# Patient Record
Sex: Female | Born: 1945 | Race: White | Hispanic: No | Smoking: Never smoker
Health system: Southern US, Community
[De-identification: ages and names within clinical notes are randomized; demographics above are authoritative.]

## PROBLEM LIST (undated history)

## (undated) DIAGNOSIS — Z85828 Personal history of other malignant neoplasm of skin: Secondary | ICD-10-CM

## (undated) DIAGNOSIS — I341 Nonrheumatic mitral (valve) prolapse: Secondary | ICD-10-CM

## (undated) DIAGNOSIS — D126 Benign neoplasm of colon, unspecified: Secondary | ICD-10-CM

## (undated) DIAGNOSIS — M109 Gout, unspecified: Secondary | ICD-10-CM

## (undated) DIAGNOSIS — E785 Hyperlipidemia, unspecified: Secondary | ICD-10-CM

## (undated) DIAGNOSIS — I10 Essential (primary) hypertension: Secondary | ICD-10-CM

## (undated) DIAGNOSIS — R7309 Other abnormal glucose: Secondary | ICD-10-CM

## (undated) HISTORY — DX: Personal history of other malignant neoplasm of skin: Z85.828

## (undated) HISTORY — DX: Gout, unspecified: M10.9

## (undated) HISTORY — DX: Nonrheumatic mitral (valve) prolapse: I34.1

## (undated) HISTORY — DX: Hyperlipidemia, unspecified: E78.5

## (undated) HISTORY — DX: Benign neoplasm of colon, unspecified: D12.6

## (undated) HISTORY — DX: Essential (primary) hypertension: I10

## (undated) HISTORY — DX: Other abnormal glucose: R73.09

---

## 1993-08-26 HISTORY — PX: CARDIAC CATHETERIZATION: SHX172

## 2003-07-01 ENCOUNTER — Encounter: Payer: Self-pay | Admitting: Family Medicine

## 2003-07-01 HISTORY — PX: COLONOSCOPY: SHX174

## 2006-06-24 LAB — CONVERTED CEMR LAB: Pap Smear: NORMAL

## 2008-03-10 ENCOUNTER — Encounter: Payer: Self-pay | Admitting: Family Medicine

## 2008-05-31 ENCOUNTER — Encounter: Payer: Self-pay | Admitting: Family Medicine

## 2008-06-26 HISTORY — PX: MOHS SURGERY: SUR867

## 2009-07-27 ENCOUNTER — Ambulatory Visit: Payer: Self-pay | Admitting: Family Medicine

## 2009-07-27 DIAGNOSIS — I10 Essential (primary) hypertension: Secondary | ICD-10-CM | POA: Insufficient documentation

## 2009-07-27 DIAGNOSIS — E785 Hyperlipidemia, unspecified: Secondary | ICD-10-CM | POA: Insufficient documentation

## 2009-07-27 DIAGNOSIS — Z85828 Personal history of other malignant neoplasm of skin: Secondary | ICD-10-CM | POA: Insufficient documentation

## 2009-08-31 ENCOUNTER — Encounter: Payer: Self-pay | Admitting: Family Medicine

## 2009-09-11 ENCOUNTER — Encounter: Admission: RE | Admit: 2009-09-11 | Discharge: 2009-09-11 | Payer: Self-pay | Admitting: Family Medicine

## 2009-09-14 ENCOUNTER — Encounter (INDEPENDENT_AMBULATORY_CARE_PROVIDER_SITE_OTHER): Payer: Self-pay | Admitting: *Deleted

## 2009-09-19 ENCOUNTER — Ambulatory Visit: Payer: Self-pay | Admitting: Family Medicine

## 2009-09-20 LAB — CONVERTED CEMR LAB
AST: 34 units/L (ref 0–37)
Albumin: 4.2 g/dL (ref 3.5–5.2)
Alkaline Phosphatase: 61 units/L (ref 39–117)
Basophils Absolute: 0.1 10*3/uL (ref 0.0–0.1)
CO2: 28 meq/L (ref 19–32)
Calcium: 9.9 mg/dL (ref 8.4–10.5)
Creatinine, Ser: 0.8 mg/dL (ref 0.4–1.2)
Eosinophils Absolute: 0.1 10*3/uL (ref 0.0–0.7)
GFR calc non Af Amer: 76.73 mL/min (ref >60)
HDL: 68.5 mg/dL (ref >39.00)
Hemoglobin: 12.6 g/dL (ref 12.0–15.0)
LDL Cholesterol: 61 mg/dL (ref 0–99)
Lymphocytes Relative: 28.4 % (ref 12.0–46.0)
Lymphs Abs: 2 10*3/uL (ref 0.7–4.0)
MCHC: 32 g/dL (ref 30.0–36.0)
Monocytes Relative: 10.6 % (ref 3.0–12.0)
Neutro Abs: 4.2 10*3/uL (ref 1.4–7.7)
Platelets: 263 10*3/uL (ref 150.0–400.0)
RBC: 4.05 M/uL (ref 3.87–5.11)
RDW: 12.8 % (ref 11.5–14.6)
Sodium: 138 meq/L (ref 135–145)
TSH: 2.9 microintl units/mL (ref 0.35–5.50)
Total Bilirubin: 0.8 mg/dL (ref 0.3–1.2)
Total CHOL/HDL Ratio: 2
Triglycerides: 151 mg/dL — ABNORMAL HIGH (ref 0.0–149.0)
VLDL: 30.2 mg/dL (ref 0.0–40.0)

## 2009-09-25 ENCOUNTER — Other Ambulatory Visit: Admission: RE | Admit: 2009-09-25 | Discharge: 2009-09-25 | Payer: Self-pay | Admitting: Family Medicine

## 2009-09-25 ENCOUNTER — Ambulatory Visit: Payer: Self-pay | Admitting: Family Medicine

## 2009-10-05 ENCOUNTER — Encounter (INDEPENDENT_AMBULATORY_CARE_PROVIDER_SITE_OTHER): Payer: Self-pay | Admitting: *Deleted

## 2009-10-05 LAB — CONVERTED CEMR LAB: Pap Smear: NEGATIVE

## 2010-03-05 ENCOUNTER — Telehealth: Payer: Self-pay | Admitting: Family Medicine

## 2010-03-05 DIAGNOSIS — D126 Benign neoplasm of colon, unspecified: Secondary | ICD-10-CM | POA: Insufficient documentation

## 2010-03-23 ENCOUNTER — Encounter (INDEPENDENT_AMBULATORY_CARE_PROVIDER_SITE_OTHER): Payer: Self-pay

## 2010-03-27 ENCOUNTER — Ambulatory Visit: Payer: Self-pay | Admitting: Internal Medicine

## 2010-04-10 ENCOUNTER — Ambulatory Visit: Payer: Self-pay | Admitting: Internal Medicine

## 2010-04-17 ENCOUNTER — Encounter: Payer: Self-pay | Admitting: Family Medicine

## 2010-04-17 ENCOUNTER — Telehealth: Payer: Self-pay | Admitting: Family Medicine

## 2010-04-20 ENCOUNTER — Ambulatory Visit: Payer: Self-pay | Admitting: Family Medicine

## 2010-04-23 LAB — CONVERTED CEMR LAB
Hgb A1c MFr Bld: 6.4 % (ref 4.6–6.5)
Microalb Creat Ratio: 1.6 mg/g (ref 0.0–30.0)
Microalb, Ur: 0.5 mg/dL (ref 0.0–1.9)

## 2010-05-29 ENCOUNTER — Encounter (INDEPENDENT_AMBULATORY_CARE_PROVIDER_SITE_OTHER): Payer: Self-pay | Admitting: *Deleted

## 2010-09-24 ENCOUNTER — Encounter
Admission: RE | Admit: 2010-09-24 | Discharge: 2010-09-24 | Payer: Self-pay | Source: Home / Self Care | Attending: Family Medicine | Admitting: Family Medicine

## 2010-09-25 ENCOUNTER — Encounter (INDEPENDENT_AMBULATORY_CARE_PROVIDER_SITE_OTHER): Payer: Self-pay | Admitting: *Deleted

## 2010-09-25 NOTE — Letter (Addendum)
Summary: Glucose Log from Patient  Glucose Log from Patient   Imported By: Lanelle Bal 04/24/2010 11:56:21  _____________________________________________________________________  External Attachment:    Type:   Image     Comment:   External Document

## 2010-09-25 NOTE — Assessment & Plan Note (Addendum)
Summary: CPX  CYD   Vital Signs:  Patient profile:   64 year old female Height:      60 inches Weight:      127 pounds BMI:     24.89 Temp:     98.7 degrees F oral Pulse rate:   64 / minute Pulse rhythm:   regular BP sitting:   135 / 70  (left arm) Cuff size:   regular  Vitals Entered By: Lowella Petties CMA (September 25, 2009 2:03 PM)  Serial Vital Signs/Assessments:  Time      Position  BP       Pulse  Resp  Temp     By                     135/70                         Judith Part MD  CC: 30 minute check up   History of Present Illness: here for health mt exam  has been feeling well overall -- nothing new   wt is stable with bmi of 24  HTN -- bp high today 150/80 is not usually that high -- is usually 130s / 70s -- perhaps some white coat component   AIC 6.3- hyperglycemia  this is the highest it has been  is not really exercising  does not eat sweets at all / only diet drinks and no juice  needs to cut down more on her starch -- potato/ pasta/ cracker  went to DM educator in the past -- knows how to monitor it  her fasting glucose used to be higher - it was 99   opthy- [facility]/ no DM retinop/ borderline glaucoma   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 some concern about that  she herself is not worried about it -- it has not changed at all    pap 10/07 wants to go ahead with that   mam 1/11 self exam -- no lumps or changes Td 06  flu shot got that   shingles status- has never had it  is not interested in the vaccine  lipids very well controlled with statin Last Lipid ProfileCholesterol: 160 (09/19/2009 9:48:02 AM)HDL:  68.50 (09/19/2009 9:48:02 AM)LDL:  61 (09/19/2009 9:48:02 AM)Triglycerides:  Last Liver profileSGOT:  34 (09/19/2009 9:48:02 AM)SPGT:  27 (09/19/2009 9:48:02 AM)T. Bili:  0.8 (09/19/2009 9:48:02 AM)Alk  Phos:  61 (09/19/2009 9:48:02 AM)   OP screen --had borderline osteopenia / normal  ca and D-- is not on calcium (gives her stomach upset )      Allergies: 1)  ! Ampicillin 2)  ! Keflex 3)  ! * Calcium  Past History:  Past Medical History: Last updated: 07/27/2009 basal skin ca HTN hyperlipidemia  colon polyps  hyperglycemia  MVP - very mild   derm Dr Cherly Hensen - in [place] [facility] Dr Everlene Farrier  Past Surgical History: Last updated: 07/27/2009 mohs surgery for basal cell lesion 11/09 cardiac cath 1995  was normal (after an abn stress test)  colonoscopy 07/01/2003- polyps   Family History: Last updated: 07/27/2009 mother is 65 and healthy , macular degeneration  father died at 37 (sudden cardiac death ? 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 Eyes:  Denies blurring and eye pain. CV:  Denies chest pain or discomfort and shortness of breath with exertion. Resp:  Denies cough and wheezing. GI:  Denies abdominal pain, bloody stools, change in bowel habits, indigestion, and nausea. GU:  Denies abnormal vaginal bleeding, discharge, dysuria, and urinary frequency. MS:  Denies joint pain and cramps. Derm:  Denies itching, lesion(s), and rash. Neuro:  Denies numbness and tingling. Psych:  mood is ok. Endo:  Denies excessive thirst and excessive urination. Heme:  Denies abnormal bruising and bleeding.  Physical Exam  General:  Well-developed,well-nourished,in no acute distress; alert,appropriate and cooperative throughout examination Head:  normocephalic, atraumatic, and no abnormalities observed.   Eyes:  vision grossly intact, pupils equal, pupils round, and pupils reactive to light.  no conjunctival pallor, injection or icterus    Ears:  R ear normal and L ear normal.   Nose:  no nasal discharge.   Mouth:  pharynx pink and moist.   Neck:  supple with full rom and no masses or thyromegally, no JVD or carotid bruit  Chest Wall:  No deformities, masses, or tenderness noted. Breasts:  No mass, nodules, thickening, tenderness, bulging, retraction, inflamation, nipple discharge or skin changes noted.   Lungs:  Normal respiratory effort, chest expands symmetrically. Lungs are clear to auscultation, no crackles or wheezes. Heart:  Normal rate and regular rhythm. S1 and S2 normal without gallop, murmur, click, rub or other extra sounds. (no M heard) Abdomen:  Bowel sounds positive,abdomen soft and non-tender without masses, organomegaly or hernias noted. Genitalia:  Normal introitus for age, no external lesions, no vaginal discharge, mucosa pink and moist, no vaginal or cervical lesions, no vaginal atrophy, no friaility or hemorrhage, normal uterus size and position, no adnexal masses or tenderness Msk:  No deformity or scoliosis noted of thoracic or lumbar spine.  no acute joint changes  Pulses:  R and L carotid,radial,femoral,dorsalis pedis and posterior tibial pulses are full and equal bilaterally Extremities:  No clubbing, cyanosis, edema, or deformity noted with normal full range of motion of all joints.   Neurologic:  sensation intact to light touch, gait normal, and DTRs symmetrical and normal.   Skin:  scar in mid chest - healing  no rash Cervical Nodes:  No lymphadenopathy noted Axillary Nodes:  No palpable lymphadenopathy Inguinal Nodes:  No significant adenopathy Psych:  normal affect, talkative and pleasant   Diabetes Management Exam:    Eye Exam:       Eye Exam done elsewhere          Date: 09/23/2009          Results: normal          Done by: [facility]   Impression & Recommendations:  Problem # 1:  HEALTH MAINTENANCE EXAM (ICD-V70.0) Assessment Comment Only reviewed health habits including  diet, exercise and skin cancer prevention reviewed health maintenance list and family history rev labs today  adv to start regular exercise   Problem # 2:  DIABETES MELLITUS, 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 Take one by mouth daily  Problem # 3:  HYPERLIPIDEMIA (ICD-272.4) Assessment: Unchanged  great control with lipitor and diet  rev low sat fat diet  Her updated medication list for this problem includes:    Lipitor 20 Mg Tabs (Atorvastatin calcium) .Marland Kitchen... Take one by mouth daily  Labs Reviewed: SGOT: 34 (09/19/2009)   SGPT: 27 (09/19/2009)   HDL:68.50 (09/19/2009)  LDL:61 (09/19/2009)  Chol:160 (09/19/2009)  Trig:151.0 (09/19/2009)  Problem # 4:  HYPERTENSION (ICD-401.9) Assessment: Unchanged  bp imp on second check today disc healthy diet (low simple sugar/ choose complex carbs/ low sat fat) diet and exercise in detail  Her updated medication list for this problem includes:    Lisinopril 20 Mg Tabs (Lisinopril) .Marland Kitchen... Take one by mouth daily    Atenolol 25 Mg Tabs (Atenolol) .Marland Kitchen... 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 Take one by mouth daily 2)  Atenolol 25 Mg Tabs (Atenolol) .... Take one by mouth daily 3)  Lipitor 20 Mg Tabs (Atorvastatin calcium) .... Take one by mouth daily  Patient Instructions: 1)  the current recommendation for calcium intake is 1200-1500 mg daily with -1000 IU of vitamin D  2)  please monitor blood sugar two times a day (fasting and then 2 hours after a meal) -- for about a month to get a feel for when your sugar is high  3)  work on cutting portions of carbs and choosing complex one 4)  good book is called 'sugar busters' 5)  It is important that you exercise reguarly at least 20 minutes 5 times a week. If you develop chest pain, have severe difficulty breathing, or feel very tired, stop exercising immediately and seek medical attention.  6)  schedule non fasting labs and then f/u in 6 mo   glucose/ microalb and AIC 250.0  Prescriptions: LIPITOR 20 MG TABS (ATORVASTATIN CALCIUM) take one by mouth daily  #30 x 11   Entered and Authorized by:   Judith Part MD   Signed by:   Judith Part MD on 09/25/2009   Method used:   Print then Give to Patient   RxID:   [id number] ATENOLOL 25 MG TABS (ATENOLOL) take one by mouth daily  #90 x 3   Entered and Authorized by:   Judith Part MD   Signed by:   Judith Part MD on 09/25/2009   Method used:   Print then Give to Patient   RxID:   [id number] LISINOPRIL 20 MG TABS (LISINOPRIL) take one by mouth daily  #90 x 3   Entered and Authorized by:   Judith Part MD   Signed by:   Judith Part MD on 09/25/2009   Method used:  Print then Give to Patient   RxID:   [id number]   Prior Medications (reviewed today): LISINOPRIL 20 MG TABS (LISINOPRIL) take one by mouth daily ATENOLOL 25 MG TABS (ATENOLOL) take one by mouth daily LIPITOR 20 MG TABS (ATORVASTATIN CALCIUM) take one by mouth daily Current Allergies (reviewed today): ! AMPICILLIN ! KEFLEX ! *  CALCIUM     Influenza Immunization History:    Influenza # 1:  Fluvax 3+ (05/26/2009)

## 2010-09-25 NOTE — Letter (Addendum)
Summary: [facility] Instructions  [facility]  [address] [place], [place] [address]   Phone: [phone]  Fax: [phone]       Cindy [name]    Apr 28, 1946    MRN: [id number]       Procedure Day Dorna Bloom:  [name]  04/10/10     Arrival Time: 7:30am     Procedure Time: 8:00am     Location of Procedure:                    [name] (4th Floor)   PREPARATION FOR COLONOSCOPY WITH MIRALAX  Starting 5 days prior to your procedure  THURSDAY 08/11  do not eat nuts, seeds, popcorn, corn, beans, peas,  salads, or any raw vegetables.  Do not take any fiber supplements (e.g. Metamucil, Citrucel, and Benefiber). ____________________________________________________________________________________________________   THE DAY BEFORE YOUR PROCEDURE        DATE: MONDAY  08/15  1   Drink clear liquids the entire day-NO SOLID FOOD  2   Do not drink anything colored red or purple.  Avoid juices with pulp.  No orange juice.  3   Drink at least 64 oz. (8 glasses) of fluid/clear liquids during the day to prevent dehydration and help the prep work efficiently.  CLEAR LIQUIDS INCLUDE: Water Jello Ice Popsicles Tea (sugar ok, no milk/cream) Powdered fruit flavored drinks Coffee (sugar ok, no milk/cream) Gatorade Juice: apple, white grape, white cranberry  Lemonade Clear bullion, consomm, broth Carbonated beverages (any kind) Strained chicken noodle soup Hard Candy  4   Mix the entire bottle of Miralax with 64 oz. of Gatorade/Powerade in the morning and put in the refrigerator to chill.  5   At 3:00 pm take 2 Dulcolax/Bisacodyl tablets.  6   At 4:30 pm take one Reglan/Metoclopramide tablet.  7  Starting at 5:00 pm drink one 8 oz glass of the Miralax mixture every 15-20 minutes until you have finished drinking the entire 64 oz.  You should finish drinking prep around 7:30 or 8:00 pm.  8   If you are nauseated, you may take the 2nd Reglan/Metoclopramide  tablet at 6:30 pm.        9    At 8:00 pm take 2 more DULCOLAX/Bisacodyl tablets.     THE DAY OF YOUR PROCEDURE      DATE:  TUESDAY  08/16  You may drink clear liquids until  6:00am   (2 HOURS BEFORE PROCEDURE).   MEDICATION INSTRUCTIONS  Unless otherwise instructed, you should take regular prescription medications with a small sip of water as early as possible the morning of your procedure.          OTHER INSTRUCTIONS  You will need a responsible adult at least 66 years of age to accompany you and drive you home.   This person must remain in the waiting room during your procedure.  Wear loose fitting clothing that is easily removed.  Leave jewelry and other valuables at home.  However, you may wish to bring a book to read or an iPod/MP3 player to listen to music as you wait for your procedure to start.  Remove all body piercing jewelry and leave at home.  Total time from sign-in until discharge is approximately 2-3 hours.  You should go home directly after your procedure and rest.  You can resume normal activities the day after your procedure.  The day of your procedure you should not:   Drive  Make legal decisions   Operate machinery   Drink alcohol   Return to work  You will receive specific instructions about eating, activities and medications before you leave.   The above instructions have been reviewed and explained to me by   Ulis Rias RN  March 27, 2010 10:08 AM     I fully understand and can verbalize these instructions _____________________________ Date _______

## 2010-09-25 NOTE — Miscellaneous (Addendum)
Summary: flu vaccine at [facility]  Clinical Lists Changes  Observations: Added new observation of FLU VAX: Historical (05/28/2010 8:18)      Immunization History:  Influenza Immunization History:    Influenza:  historical (05/28/2010)  Pt received flu vaccine at [facility] [address], [place].              Lowella Petties CMA  May 29, 2010 8:19 AM

## 2010-09-25 NOTE — Consult Note (Addendum)
Summary: [facility]  [facility]   Imported By: Lanelle Bal 09/08/2009 14:01:21  _____________________________________________________________________  External Attachment:    Type:   Image     Comment:   External Document

## 2010-09-25 NOTE — Letter (Addendum)
Summary: Results Follow up Letter  [facility]  [address] [place], [place] [address]   Phone: [phone]  Fax: [phone]    10/05/2009 MRN: [id number]    [facility] [address] [place], [place]  [address]    Dear Ms. Delauder,  The following are the results of your recent test(s):  Test         Result    Pap Smear:        Normal __X___  Not Normal _____ Comments:  Yearly follow up is recommended. ______________________________________________________ Cholesterol: LDL(Bad cholesterol):         Your goal is less than:         HDL (Good cholesterol):       Your goal is more than: Comments:  ______________________________________________________ Mammogram:        Normal _____  Not Normal _____ Comments:  ___________________________________________________________________ Hemoccult:        Normal _____  Not normal _______ Comments:    _____________________________________________________________________ Other Tests:    We routinely do not discuss normal results over the telephone.  If you desire a copy of the results, or you have any questions about this information we can discuss them at your next office visit.   Sincerely,    Marne A. Milinda Antis, M.D.  MAT:lsf

## 2010-09-25 NOTE — Miscellaneous (Addendum)
Summary: mammogram results  Clinical Lists Changes  Observations: Added new observation of MAMMO DUE: 09/2010 (09/14/2009 8:19) Added new observation of MAMMOGRAM: normal (09/14/2009 8:19)      Preventive Care Screening  Mammogram:    Date:  09/14/2009    Next Due:  09/2010    Results:  normal

## 2010-09-25 NOTE — Miscellaneous (Addendum)
Summary: [facility] previsit  Clinical Lists Changes  Medications: Added new medication of MIRALAX   POWD (POLYETHYLENE GLYCOL 3350) As per prep  instructions. - Signed Added new medication of DULCOLAX 5 MG  TBEC (BISACODYL) Day before procedure take 2 at 3pm and 2 at 8pm. - Signed Added new medication of REGLAN 10 MG  TABS (METOCLOPRAMIDE HCL) As per prep instructions. - Signed Rx of MIRALAX   POWD (POLYETHYLENE GLYCOL 3350) As per prep  instructions.;  #255gm x 0;  Signed;  Entered by: Ulis Rias RN;  Authorized by: Hart Carwin MD;  Method used: Electronically to [facility] [id number] [place], [place]  [address], Ph: [phone], Fax: [phone] Rx of DULCOLAX 5 MG  TBEC (BISACODYL) Day before procedure take 2 at 3pm and 2 at 8pm.;  #4 x 0;  Signed;  Entered by: Ulis Rias RN;  Authorized by: Hart Carwin MD;  Method used: Electronically to [facility] [address] [place] #[id number]*, [address], [place], [place]  [address], Ph: [phone], Fax: [phone] Rx of REGLAN 10 MG  TABS (METOCLOPRAMIDE HCL) As per prep instructions.;  #2 x 0;  Signed;  Entered by: Ulis Rias RN;  Authorized by: Hart Carwin MD;  Method used: Electronically to [facility] [address] [place] #[id number]*, [address] [place], [place]  [address], Ph: [phone], Fax: [phone] Observations: Added new observation of ALLERGY REV: Done (03/27/2010 9:43)    Prescriptions: REGLAN 10 MG  TABS (METOCLOPRAMIDE HCL) As per prep instructions.  #2 x 0   Entered by:   Ulis Rias RN   Authorized by:   Hart Carwin MD   Signed by:   Ulis Rias RN on 03/27/2010   Method used:   Electronically to        [facility] [address] [phone]* (retail)       [address] [place], [place]  [address]       Ph: [phone]       Fax: [phone]   RxID:   [id number] DULCOLAX 5 MG  TBEC (BISACODYL) Day before procedure take 2 at 3pm and 2 at 8pm.  #4 x 0   Entered by:   Ulis Rias RN   Authorized by:   Hart Carwin MD   Signed by:   Ulis Rias RN on 03/27/2010   Method used:   Electronically to        [facility] [address] [phone]* (retail)       [address] [place], [place]  [address]       Ph: [phone]       Fax: [phone]   RxID:   [id number] MIRALAX   POWD (POLYETHYLENE GLYCOL 3350) As per prep  instructions.  #255gm x 0   Entered by:   Ulis Rias RN   Authorized by:   Hart Carwin MD   Signed by:   Ulis Rias RN on 03/27/2010   Method used:   Electronically to        [facility] [address] [phone]* (retail)       [address] [place]  [address]       Ph: [phone]       Fax: [phone]   RxID:   [id number]

## 2010-09-25 NOTE — Progress Notes (Addendum)
Summary: wants referral for colonoscopy  Phone Note Call from Patient Call back at Home Phone [phone]   Caller: Patient Call For: Judith Part MD Summary of Call: Pt wants referral for colonoscopy, she is over due for one. She wants to go to [facility], either the first or third week if possible. Initial call taken by: Lowella Petties CMA,  March 05, 2010 10:32 AM  Follow-up for Phone Call        will do ref  Follow-up by: Judith Part MD,  March 05, 2010 1:22 PM  New Problems: COLONIC POLYPS (ICD-211.3)   New Problems: COLONIC POLYPS (ICD-211.3)

## 2010-09-25 NOTE — Procedures (Addendum)
Summary: Colonoscopy  Patient: Cindy [name] Note: All result statuses are Final unless otherwise noted.  Tests: (1) Colonoscopy (COL)   COL Colonoscopy           DONE     [facility]     [address]     [place], [place]  [address]           COLONOSCOPY PROCEDURE REPORT           PATIENT:  Cindy, [name]  MR#:  [id number]     BIRTHDATE:  July 15, 1946, 63 yrs. old  GENDER:  female     ENDOSCOPIST:  Hedwig Morton. Juanda Chance, MD     REF. BY:  Marne A. Milinda Antis, M.D.     PROCEDURE DATE:  04/10/2010     PROCEDURE:  Colonoscopy [address]     ASA CLASS:  Class I     INDICATIONS:  history of polyps pt reports having 3 polyps on     prior colonoscopy elsewhere     MEDICATIONS:   Versed 8 mg, Fentanyl 75 mcg           DESCRIPTION OF PROCEDURE:   After the risks benefits and     alternatives of the procedure were thoroughly explained, informed     consent was obtained.  Digital rectal exam was performed and     revealed no rectal masses.   The LB [id number] endoscope     was introduced through the anus and advanced to the cecum, which     was identified by both the appendix and ileocecal valve, without     limitations.  The quality of the prep was excellent, using     MiraLax.  The instrument was then slowly withdrawn as the colon     was fully examined.     <<PROCEDUREIMAGES>>           FINDINGS:  Mild diverticulosis was found in the sigmoid colon (see     image1, image7, and image8).  This was otherwise a normal     examination of the colon (see image2, image3, image4, image5, and     image9).   Retroflexed views in the rectum revealed no     abnormalities.    The scope was then withdrawn from the patient     and the procedure completed.           COMPLICATIONS:  None     ENDOSCOPIC IMPRESSION:     1) Mild diverticulosis in the sigmoid colon     2) Otherwise normal examination     no recurrent polyps     RECOMMENDATIONS:     1) high fiber diet     REPEAT EXAM:  In 5 - 7  year(s) for. will review prior colon report     to determine recall interval           ______________________________     Hedwig Morton. Juanda Chance, MD           CC:           n.     eSIGNED:   Hedwig Morton. Brodie at 04/10/2010 08:35 AM           [name], [id number]  Note: An exclamation mark (!) indicates a result that was not dispersed into the flowsheet. Document Creation Date: 04/10/2010 8:35 AM _______________________________________________________________________  (1) Order result status: Final Collection or observation date-time: 04/10/2010 08:27 Requested date-time:  Receipt date-time:  Reported date-time:  Referring  Physician:   Ordering Physician: Lina Sar [phone]) Specimen Source:  Source: Launa Grill Order Number: [id number] Lab site:   Appended Document: Colonoscopy    Clinical Lists Changes  Observations: Added new observation of COLONNXTDUE: 03/2015 (04/10/2010 13:33)

## 2010-09-25 NOTE — Progress Notes (Addendum)
Summary: blood glucose log  Phone Note Call from Patient Call back at [phone]   Caller: Patient Call For: Judith Part MD Summary of Call: Pt dropped by a log of blood glucose monitoring from 02-24-10 thru 08/-22-11. Log is on your shelf in the in box. Pt also wanted to know if you wanted her to have the labs for A1c and etc now or can it wait until her next physical? I cannot see where pt has appt for lab or f/u with you scheduled at this time.Please advise.  Initial call taken by: Lewanda Rife LPN,  April 17, 2010 4:59 PM  Follow-up for Phone Call        the instructions on the paper I gave her (interestingly that she returned with the note on it)- says schedule non fasting labs in 6 months for glucose/ micraolb/ AIC 250.0--- (please schedule now ) her blood surgars look great so no doubt her numbers will be good  Follow-up by: Judith Part MD,  April 18, 2010 8:06 AM  Additional Follow-up for Phone Call Additional follow up Details #1::        Patient notified as instructed by telephone. lab appointment scheduled as instructed  04/23/10 at 11:30am.[facility] LPN  April 18, 2010 9:42 AM

## 2010-10-03 NOTE — Miscellaneous (Addendum)
Summary: Mammogram to flowsheet  Clinical Lists Changes  Observations: Added new observation of MAMMO DUE: 09/2011 (09/25/2010 7:59) Added new observation of MAMMOGRAM: normal (09/24/2010 7:59)      Preventive Care Screening  Mammogram:    Date:  09/24/2010    Next Due:  09/2011    Results:  normal

## 2010-10-03 NOTE — Letter (Addendum)
Summary: Results Follow up Letter  [place] [facility]  [address] [place], [place] [address]   Phone: [phone]  Fax: [phone]    09/25/2010 MRN: [id number]  [facility] [address] [place], [place]  [address]  Dear Ms. Shain,  The following are the results of your recent test(s):  Test         Result    Pap Smear:        Normal _____  Not Normal _____ Comments: ______________________________________________________ Cholesterol: LDL(Bad cholesterol):         Your goal is less than:         HDL (Good cholesterol):       Your goal is more than: Comments:  ______________________________________________________ Mammogram:        Normal __X___  Not Normal _____ Comments: Repeat in 1 year  ___________________________________________________________________ Hemoccult:        Normal _____  Not normal _______ Comments:    _____________________________________________________________________ Other Tests:    We routinely do not discuss normal results over the telephone.  If you desire a copy of the results, or you have any questions about this information we can discuss them at your next office visit.   Sincerely,       Sharilyn Sites for Dr. Roxy Manns

## 2010-10-10 ENCOUNTER — Encounter: Payer: Self-pay | Admitting: Family Medicine

## 2010-10-10 ENCOUNTER — Encounter (INDEPENDENT_AMBULATORY_CARE_PROVIDER_SITE_OTHER): Payer: Medicare Other | Admitting: Family Medicine

## 2010-10-10 ENCOUNTER — Other Ambulatory Visit: Payer: Self-pay | Admitting: Family Medicine

## 2010-10-10 DIAGNOSIS — E785 Hyperlipidemia, unspecified: Secondary | ICD-10-CM

## 2010-10-10 DIAGNOSIS — R7309 Other abnormal glucose: Secondary | ICD-10-CM

## 2010-10-10 DIAGNOSIS — R7303 Prediabetes: Secondary | ICD-10-CM | POA: Insufficient documentation

## 2010-10-10 DIAGNOSIS — I10 Essential (primary) hypertension: Secondary | ICD-10-CM

## 2010-10-10 DIAGNOSIS — R739 Hyperglycemia, unspecified: Secondary | ICD-10-CM

## 2010-10-10 DIAGNOSIS — Z85828 Personal history of other malignant neoplasm of skin: Secondary | ICD-10-CM

## 2010-10-10 LAB — RENAL FUNCTION PANEL
Calcium: 9.7 mg/dL (ref 8.4–10.5)
Creatinine, Ser: 0.7 mg/dL (ref 0.4–1.2)
Glucose, Bld: 107 mg/dL — ABNORMAL HIGH (ref 70–99)
Phosphorus: 3.6 mg/dL (ref 2.3–4.6)
Potassium: 4.6 mEq/L (ref 3.5–5.1)
Sodium: 138 mEq/L (ref 135–145)

## 2010-10-10 LAB — HEPATIC FUNCTION PANEL
Albumin: 4.2 g/dL (ref 3.5–5.2)
Alkaline Phosphatase: 53 U/L (ref 39–117)
Total Protein: 7.3 g/dL (ref 6.0–8.3)

## 2010-10-10 LAB — LIPID PANEL
Cholesterol: 186 mg/dL (ref 0–200)
HDL: 62.5 mg/dL (ref >39.00)
Triglycerides: 199 mg/dL — ABNORMAL HIGH (ref 0.0–149.0)

## 2010-10-10 LAB — TSH: TSH: 2.06 u[IU]/mL (ref 0.35–5.50)

## 2010-10-11 LAB — CBC WITH DIFFERENTIAL/PLATELET
Basophils Absolute: 0 10*3/uL (ref 0.0–0.1)
Eosinophils Relative: 0.9 % (ref 0.0–5.0)
MCV: 94.5 fl (ref 78.0–100.0)
Monocytes Absolute: 0.6 10*3/uL (ref 0.1–1.0)
Monocytes Relative: 8.5 % (ref 3.0–12.0)
Neutrophils Relative %: 60 % (ref 43.0–77.0)
Platelets: 265 10*3/uL (ref 150.0–400.0)
WBC: 6.8 10*3/uL (ref 4.5–10.5)

## 2010-10-17 NOTE — Assessment & Plan Note (Signed)
Summary: CPE   Vital Signs:  Patient profile:   65 year old female Height:      60 inches Weight:      129.25 pounds BMI:     25.33 Temp:     98.2 degrees F oral Pulse rate:   64 / minute Pulse rhythm:   regular BP sitting:   130 / 78  (left arm) Cuff size:   regular  Vitals Entered By: Lewanda Rife LPN (October 10, 2010 10:46 AM) CC: check up of chronic problems    History of Present Illness: here for check up of chronic medical conditions and also to review health mt list  has been feeling fine  nothing new medically   wt is up 2 lb with bmi of 25-- she does work to maintain it  needs some winter exercise  eats healthy and small portions   130/78 -- good HTN control  due for lipids on lipitor-- wants to do that today  good diet   colonosc 8/11 hx of polyp- 5 y f/u rec  no bowel changes   hyperglycemia last AIC 6.4  pap 1/11 no abn paps, no problems , no new partners- not sexually active   mam 1/12 self exam - no masses or lumps   is due for dermatology follow up -- had moh's - basal cell   flu shot fall Td 06 does not have shingles vaccine  wants pneumovax -- [facility] to get [facility]   had dexa -- thinks it was within 5 years  is interested in that  wants to go to breast center - wants to make own appt  not taking ca and vit d     Anticoagulation Management History:      Positive risk factors for bleeding include an age of 42 years or older and presence of serious comorbidities.  The bleeding index is 'intermediate risk'.  Positive CHADS2 values include History of HTN and History of Diabetes.  Negative CHADS2 values include Age > 66 years old.    Allergies: 1)  ! Ampicillin 2)  ! Keflex 3)  ! * Calcium  Past History:  Past Medical History: Last updated: 07/27/2009 basal skin ca HTN hyperlipidemia  colon polyps  hyperglycemia  MVP - very mild   derm Dr Cherly Hensen - in [place] [facility] Dr Everlene Farrier  Past Surgical History: Last updated:  07/27/2009 mohs surgery for basal cell lesion 11/09 cardiac cath 1995  was normal (after an abn stress test)  colonoscopy 07/01/2003- polyps   Family History: Last updated: 07/27/2009 mother is 46 and healthy , macular degeneration  father died at 27 (sudden cardiac death ? 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 Eyes:  Denies blurring and eye irritation. CV:  Denies chest pain or discomfort, lightheadness, palpitations, and shortness of breath with exertion. Resp:  Denies cough, shortness of breath, and wheezing. GI:  Denies abdominal pain, indigestion, nausea, and vomiting. GU:  Denies abnormal vaginal bleeding, discharge, dysuria, and urinary frequency. MS:  Denies joint pain, joint swelling, cramps, and muscle weakness. Derm:  Denies itching, lesion(s), poor wound healing, and rash. Neuro:  Denies numbness and tingling. Psych:  Denies anxiety and depression. Endo:  Denies cold intolerance, excessive thirst, excessive urination, and heat  intolerance. Heme:  Denies abnormal bruising and bleeding.  Physical Exam  General:  Well-developed,well-nourished,in no acute distress; alert,appropriate and cooperative throughout examination Head:  normocephalic, atraumatic, and no abnormalities observed.   Eyes:  vision grossly intact, pupils equal, pupils round, and pupils reactive to light.  no conjunctival pallor, injection or icterus  Ears:  R ear normal and L ear normal.   Nose:  no nasal discharge.   Mouth:  pharynx pink and moist.   Neck:  supple with full rom and no masses or thyromegally, no JVD or carotid bruit  Chest Wall:  No deformities, masses, or tenderness noted. Breasts:  No mass, nodules, thickening, tenderness, bulging, retraction,  inflamation, nipple discharge or skin changes noted.   Lungs:  Normal respiratory effort, chest expands symmetrically. Lungs are clear to auscultation, no crackles or wheezes. Heart:  Normal rate and regular rhythm. S1 and S2 normal without gallop, murmur, click, rub or other extra sounds. (no M heard) Abdomen:  Bowel sounds positive,abdomen soft and non-tender without masses, organomegaly or hernias noted. no renal bruits  Msk:  No deformity or scoliosis noted of thoracic or lumbar spine.  no acute joint changes  Pulses:  R and L carotid,radial,femoral,dorsalis pedis and posterior tibial pulses are full and equal bilaterally Extremities:  No clubbing, cyanosis, edema, or deformity noted with normal full range of motion of all joints.   Neurologic:  sensation intact to light touch, gait normal, and DTRs symmetrical and normal.   Skin:  Intact without suspicious lesions or rashes lentigos diffusely  Cervical Nodes:  No lymphadenopathy noted Axillary Nodes:  No palpable lymphadenopathy Inguinal Nodes:  No significant adenopathy Psych:  normal affect, talkative and pleasant    Impression & Recommendations:  Problem # 1:  HYPERGLYCEMIA (ICD-790.29) Assessment Unchanged  check AIC today not a sugar eater enc exercise  Orders: Venipuncture (19147) TLB-Lipid Panel (80061-LIPID) TLB-Renal Function Panel (80069-RENAL) TLB-CBC Platelet - w/Differential (85025-CBCD) TLB-Hepatic/Liver Function Pnl (80076-HEPATIC) TLB-TSH (Thyroid Stimulating Hormone) (84443-TSH) TLB-A1C / Hgb A1C (Glycohemoglobin) (83036-A1C)  Labs Reviewed: Creat: 0.8 (09/19/2009)     Last Eye Exam: normal (09/23/2009)  Problem # 2:  HYPERLIPIDEMIA (ICD-272.4) Assessment: Unchanged  lab today on generic lipitor  continue this  diet rev- low sat fat   Her updated medication list for this problem includes:    Lipitor 20 Mg Tabs (Atorvastatin calcium) .Marland Kitchen... Take one by mouth daily  Orders: Venipuncture  (82956) TLB-Lipid Panel (80061-LIPID) TLB-Renal Function Panel (80069-RENAL) TLB-CBC Platelet - w/Differential (85025-CBCD) TLB-Hepatic/Liver Function Pnl (80076-HEPATIC) TLB-TSH (Thyroid Stimulating Hormone) (84443-TSH) TLB-A1C / Hgb A1C (Glycohemoglobin) (83036-A1C)  Labs Reviewed: SGOT: 34 (09/19/2009)   SGPT: 27 (09/19/2009)   HDL:68.50 (09/19/2009)  LDL:61 (09/19/2009)  Chol:160 (09/19/2009)  Trig:151.0 (09/19/2009)  Problem # 3:  HYPERTENSION (ICD-401.9) Assessment: Unchanged good control no change  disc exercise importance lab todayand adv med refilled Her updated medication list for this problem includes:    Lisinopril 20 Mg Tabs (Lisinopril) .Marland Kitchen... Take one by mouth daily    Atenolol 25 Mg Tabs (Atenolol) .Marland Kitchen... Take one by mouth daily  Orders: Venipuncture (21308) TLB-Lipid Panel (80061-LIPID) TLB-Renal Function Panel (80069-RENAL) TLB-CBC Platelet - w/Differential (85025-CBCD) TLB-Hepatic/Liver Function Pnl (80076-HEPATIC) TLB-TSH (Thyroid Stimulating Hormone) (84443-TSH) TLB-A1C / Hgb A1C (Glycohemoglobin) (83036-A1C)  BP today: 130/78 Prior BP: 135/70 (09/25/2009)  Labs Reviewed: K+: 4.4 (09/19/2009) Creat: : 0.8 (09/19/2009)   Chol: 160 (09/19/2009)   HDL: 68.50 (09/19/2009)   LDL: 61 (09/19/2009)   TG: 151.0 (09/19/2009)  Problem # 4:  BASAL CELL  CARCINOMA, HX OF (ICD-V10.83) Assessment: Unchanged ref to new derm for skin screen exam Orders: Dermatology Referral (Derma)  Problem # 5:  Preventive Health Care (ICD-V70.0) Assessment: Comment Only breast exam done pt will get pneumovax at pharmacy declined shingles vaccine she will sched own dexa - post men  Complete Medication List: 1)  Lisinopril 20 Mg Tabs (Lisinopril) .... Take one by mouth daily 2)  Atenolol 25 Mg Tabs (Atenolol) .... Take one by mouth daily 3)  Lipitor 20 Mg Tabs (Atorvastatin calcium) .... Take one by mouth daily 4)  Centrum Silver Tabs (Multiple vitamins-minerals) .... Take 1  tablet by mouth once a day  Anticoagulation Management Assessment/Plan:             Patient Instructions: 1)  [facility] your [facility] - I think it is a good idea  2)  we will do derm ref at check out  3)  I think you should get a bone density test -- if you need a referral please call us  4)  the current recommendation for calcium intake is 1200-1500 mg daily with 1000 IU of vitamin D  5)  exercise is important for your bones too  Prescriptions: LIPITOR 20 MG TABS (ATORVASTATIN CALCIUM) take one by mouth daily  #90 x 3   Entered and Authorized by:   Judith Part MD   Signed by:   Judith Part MD on 10/10/2010   Method used:   Print then Give to Patient   RxID:   [id number] ATENOLOL 25 MG TABS (ATENOLOL) take one by mouth daily  #90 x 3   Entered and Authorized by:   Judith Part MD   Signed by:   Judith Part MD on 10/10/2010   Method used:   Print then Give to Patient   RxID:   [id number] LISINOPRIL 20 MG TABS (LISINOPRIL) take one by mouth daily  #90 x 3   Entered and Authorized by:   Judith Part MD   Signed by:   Judith Part MD on 10/10/2010   Method used:   Print then Give to Patient   RxID:   [id number]    Orders Added: 1)  Venipuncture [36415] 2)  TLB-Lipid Panel [80061-LIPID] 3)  TLB-Renal Function Panel [80069-RENAL] 4)  TLB-CBC Platelet - w/Differential [85025-CBCD] 5)  TLB-Hepatic/Liver Function Pnl [80076-HEPATIC] 6)  TLB-TSH (Thyroid Stimulating Hormone) [84443-TSH] 7)  TLB-A1C / Hgb A1C (Glycohemoglobin) [83036-A1C] 8)  Dermatology Referral [Derma] 9)  Est. Patient Level IV [01027]    Current Allergies (reviewed today): ! AMPICILLIN ! KEFLEX ! * CALCIUM

## 2010-10-19 ENCOUNTER — Telehealth: Payer: Self-pay | Admitting: Family Medicine

## 2010-10-23 NOTE — Progress Notes (Signed)
Summary: Pneumovax rx  Phone Note Call from Patient Call back at [phone]   Caller: Patient Call For: Judith Part MD Reason for Call: Insurance Question Summary of Call: Pt needs rx for pneumonia vaccine faxed to [facility] on [address]. [phone]. Pt said no need to call her back she would ck with pharmacy on Mon. Initial call taken by: Lewanda Rife LPN,  October 19, 2010 11:08 AM  Follow-up for Phone Call        px written on EMR for call in  Follow-up by: Judith Part MD,  October 19, 2010 1:48 PM    New/Updated Medications: PNEUMOVAX 23 25 MCG/0.5ML INJ (PNEUMOCOCCAL VAC POLYVALENT) inject times one as directed Prescriptions: PNEUMOVAX 23 25 MCG/0.5ML INJ (PNEUMOCOCCAL VAC POLYVALENT) inject times one as directed  #1 x 0   Entered by:   Delilah Shan CMA (AAMA)   Authorized by:   Judith Part MD   Signed by:   Delilah Shan CMA (AAMA) on 10/19/2010   Method used:   Faxed to ...       [facility] (retail)       [address]       [place], [place]    [place]       Ph: [phone]       Fax: [phone]   RxID:   [id number] PNEUMOVAX 23 25 MCG/0.5ML INJ (PNEUMOCOCCAL VAC POLYVALENT) inject times one as directed  #1 x 0   Entered and Authorized by:   Judith Part MD   Signed by:   Judith Part MD on 10/19/2010   Method used:   Telephoned to ...       [facility] (retail)       [address]       [place], [place]    [place]       Ph: [phone]       Fax: [phone]   RxID:   [id number]

## 2010-10-30 ENCOUNTER — Other Ambulatory Visit: Payer: Self-pay | Admitting: Dermatology

## 2011-09-18 ENCOUNTER — Other Ambulatory Visit: Payer: Self-pay | Admitting: Family Medicine

## 2011-09-18 DIAGNOSIS — Z1231 Encounter for screening mammogram for malignant neoplasm of breast: Secondary | ICD-10-CM

## 2011-10-01 ENCOUNTER — Ambulatory Visit
Admission: RE | Admit: 2011-10-01 | Discharge: 2011-10-01 | Disposition: A | Discharge status: Home / Self Care | Payer: Medicare Other | Source: Ambulatory Visit | Attending: Family Medicine | Admitting: Family Medicine

## 2011-10-01 DIAGNOSIS — Z1231 Encounter for screening mammogram for malignant neoplasm of breast: Secondary | ICD-10-CM

## 2011-10-01 LAB — HM MAMMOGRAPHY: HM Mammogram: NORMAL

## 2011-10-03 ENCOUNTER — Other Ambulatory Visit: Payer: Self-pay | Admitting: Family Medicine

## 2011-10-04 NOTE — Telephone Encounter (Signed)
Patient has appt june

## 2011-10-30 ENCOUNTER — Other Ambulatory Visit: Payer: Self-pay | Admitting: Family Medicine

## 2011-11-01 ENCOUNTER — Other Ambulatory Visit: Payer: Self-pay

## 2011-11-01 MED ORDER — ATENOLOL 25 MG PO TABS: 25.0000 mg | ORAL_TABLET | Freq: Every day | ORAL | Status: DC

## 2011-11-01 MED ORDER — ATORVASTATIN CALCIUM 20 MG PO TABS: 20.0000 mg | ORAL_TABLET | Freq: Every day | ORAL | Status: DC

## 2011-11-01 NOTE — Telephone Encounter (Signed)
Pt called about refills for Lipitor 20 mg # 30 x 4 and atenolol 25 mg #30 x 4. Pt last seen 10/10/10 but already has 1st available CPX scheduled with Dr Milinda Antis 02/03/12.

## 2012-01-27 DIAGNOSIS — L821 Other seborrheic keratosis: Secondary | ICD-10-CM | POA: Diagnosis not present

## 2012-01-27 DIAGNOSIS — D1801 Hemangioma of skin and subcutaneous tissue: Secondary | ICD-10-CM | POA: Diagnosis not present

## 2012-01-27 DIAGNOSIS — D235 Other benign neoplasm of skin of trunk: Secondary | ICD-10-CM | POA: Diagnosis not present

## 2012-02-03 ENCOUNTER — Encounter: Payer: Self-pay | Admitting: Family Medicine

## 2012-02-03 ENCOUNTER — Ambulatory Visit (INDEPENDENT_AMBULATORY_CARE_PROVIDER_SITE_OTHER): Payer: Medicare Other | Admitting: Family Medicine

## 2012-02-03 VITALS — BP 128/76 | HR 60 | Temp 98.0°F | Ht 60.0 in | Wt 130.0 lb

## 2012-02-03 DIAGNOSIS — R7309 Other abnormal glucose: Secondary | ICD-10-CM

## 2012-02-03 DIAGNOSIS — I10 Essential (primary) hypertension: Secondary | ICD-10-CM

## 2012-02-03 DIAGNOSIS — E785 Hyperlipidemia, unspecified: Secondary | ICD-10-CM

## 2012-02-03 DIAGNOSIS — D126 Benign neoplasm of colon, unspecified: Secondary | ICD-10-CM

## 2012-02-03 DIAGNOSIS — M899 Disorder of bone, unspecified: Secondary | ICD-10-CM

## 2012-02-03 DIAGNOSIS — M949 Disorder of cartilage, unspecified: Secondary | ICD-10-CM | POA: Diagnosis not present

## 2012-02-03 DIAGNOSIS — M8580 Other specified disorders of bone density and structure, unspecified site: Secondary | ICD-10-CM | POA: Insufficient documentation

## 2012-02-03 LAB — CBC WITH DIFFERENTIAL/PLATELET
Basophils Relative: 0.8 % (ref 0.0–3.0)
Eosinophils Relative: 1.1 % (ref 0.0–5.0)
Lymphocytes Relative: 31 % (ref 12.0–46.0)
MCHC: 33 g/dL (ref 30.0–36.0)
MCV: 95.2 fl (ref 78.0–100.0)
Monocytes Absolute: 0.8 10*3/uL (ref 0.1–1.0)
Monocytes Relative: 11.9 % (ref 3.0–12.0)
Neutrophils Relative %: 55.2 % (ref 43.0–77.0)
Platelets: 264 10*3/uL (ref 150.0–400.0)
RDW: 13.7 % (ref 11.5–14.6)

## 2012-02-03 LAB — LIPID PANEL
HDL: 70.4 mg/dL (ref >39.00)
Total CHOL/HDL Ratio: 3

## 2012-02-03 LAB — COMPREHENSIVE METABOLIC PANEL
Albumin: 4.5 g/dL (ref 3.5–5.2)
BUN: 21 mg/dL (ref 6–23)
CO2: 27 mEq/L (ref 19–32)
Calcium: 9.7 mg/dL (ref 8.4–10.5)
Chloride: 101 mEq/L (ref 96–112)
GFR: 64.01 mL/min (ref >60.00)
Glucose, Bld: 98 mg/dL (ref 70–99)
Potassium: 4.4 mEq/L (ref 3.5–5.1)
Total Protein: 7.8 g/dL (ref 6.0–8.3)

## 2012-02-03 LAB — HEMOGLOBIN A1C: Hgb A1c MFr Bld: 6 % (ref 4.6–6.5)

## 2012-02-03 MED ORDER — LISINOPRIL 20 MG PO TABS: 20.0000 mg | ORAL_TABLET | Freq: Every day | ORAL | Status: DC

## 2012-02-03 MED ORDER — ATENOLOL 25 MG PO TABS: 25.0000 mg | ORAL_TABLET | Freq: Every day | ORAL | Status: DC

## 2012-02-03 MED ORDER — ATORVASTATIN CALCIUM 20 MG PO TABS: 20.0000 mg | ORAL_TABLET | Freq: Every day | ORAL | Status: DC

## 2012-02-03 NOTE — Assessment & Plan Note (Signed)
a1c today Wt steady Active Disc low glycemic diet

## 2012-02-03 NOTE — Assessment & Plan Note (Signed)
Up to date colonosc 2011 with 5-7 year recall

## 2012-02-03 NOTE — Patient Instructions (Addendum)
If you are interested in a shingles/zoster vaccine - call your insurance to check on coverage,( you should not get it within 1 month of other vaccines) , then call us for a prescription  for it to take to a pharmacy that gives the shot  Try to get 1200-1500 mg of calcium per day with at least 1000 iu of vitamin D - for bone health  If / when you are ready for a bone density test in the future- let me know

## 2012-02-03 NOTE — Assessment & Plan Note (Signed)
Per pt from dexa many years ago  Declines another one at this time due to schedule Disc fx risks and need for ca and D and exercise Does do a lot of lifting

## 2012-02-03 NOTE — Assessment & Plan Note (Signed)
Lipids today Has been well controlled on lipitor and diet  Disc low sat fat diet

## 2012-02-03 NOTE — Progress Notes (Signed)
Subjective:    Patient ID: Cindy [name], female    DOB: [birth year], 66 y.o.   MRN: [id number]  HPI Here for check up of chronic medical conditions and to review health mt list   Has been doing well , staying healthy   Is taking care of herself  Has to care for her husband with parkinsons - will look at adult day care soon  He has dementia also   bp is good     Today BP Readings from Last 3 Encounters:  02/03/12 128/76  10/10/10 130/78  09/25/09 135/70     No cp or palpitations or headaches or edema  No side effects to medicines  - tenormin and prinivil  Wt is stable with bmi of 25  Due for labs - will get those today   Goes to derm regularly-no new lesions - hx of basal cell   Lab Results  Component Value Date   CHOL 186 10/10/2010   HDL 62.50 10/10/2010   LDLCALC 84 10/10/2010   TRIG 199.0* 10/10/2010   CHOLHDL 3 10/10/2010    On lipitor -no problems at all   Hx of colon polyps colonosc on 04/10/10 showed tics  Will have 5-7 year recall   Zoster status - may be interested- unsure , will do more research [facility]   Got her flu shot - this fall   Pneumovax--3/12   2/13 mammogram neg Self exam   Gyn -last one here  Still has all her parts Not sexually active , and no new partners No abn paps  Nl pap 09/25/09    Patient Active Problem List  Diagnoses  . COLONIC POLYPS  . HYPERLIPIDEMIA  . HYPERTENSION  . BASAL CELL CARCINOMA, HX OF  . HYPERGLYCEMIA  . Osteopenia   Past Medical History  Diagnosis Date  . Personal history of other malignant neoplasm of skin   . Benign neoplasm of colon   . Other abnormal glucose   . Other and unspecified hyperlipidemia   . Unspecified essential hypertension   . MVP (mitral valve prolapse)     very mild   Past Surgical History  Procedure Date  . Mohs surgery 11/09    for basal cell lesion  . Cardiac catheterization 1995    normal (after abnormal stress test)  . Colonoscopy 07/01/03    polyps    History  Substance Use Topics  . Smoking status: Never Smoker   . Smokeless tobacco: Not on file  . Alcohol Use: Yes     Regular   Family History  Problem Relation Age of Onset  . Macular degeneration Mother   . Sudden death Father 82  . Hyperlipidemia Brother   . Coronary artery disease Brother     CABG  . Hypertension Brother    Allergies  Allergen Reactions  . Ampicillin     REACTION: rash  . Calcium     REACTION: GI intolerance  . Cephalexin     REACTION: rash   Current Outpatient Prescriptions on File Prior to Visit  Medication Sig Dispense Refill  . DISCONTD: atenolol (TENORMIN) 25 MG tablet Take 1 tablet (25 mg total) by mouth daily.  30 tablet  4  . DISCONTD: atorvastatin (LIPITOR) 20 MG tablet Take 1 tablet (20 mg total) by mouth daily.  30 tablet  4  . DISCONTD: lisinopril (PRINIVIL,ZESTRIL) 20 MG tablet TAKE 1 TABLET BY MOUTH EVERY DAY  90 tablet  1  Review of Systems Review of Systems  Constitutional: Negative for fever, appetite change, fatigue and unexpected weight change.  Eyes: Negative for pain and visual disturbance.  Respiratory: Negative for cough and shortness of breath.   Cardiovascular: Negative for cp or palpitations    Gastrointestinal: Negative for nausea, diarrhea and constipation.  Genitourinary: Negative for urgency and frequency.  Skin: Negative for pallor or rash   Neurological: Negative for weakness, light-headedness, numbness and headaches.  Hematological: Negative for adenopathy. Does not bruise/bleed easily.  Psychiatric/Behavioral: Negative for dysphoric mood. The patient is not nervous/anxious.  is stressed caring for her husband       Objective:   Physical Exam  Constitutional: She is oriented to person, place, and time. She appears well-developed and well-nourished. No distress.  HENT:  Head: Normocephalic and atraumatic.  Right Ear: External ear normal.  Left Ear: External ear normal.  Nose: Nose normal.    Mouth/Throat: Oropharynx is clear and moist.  Eyes: Conjunctivae and EOM are normal. Pupils are equal, round, and reactive to light. No scleral icterus.  Neck: Normal range of motion. Neck supple. No JVD present. Carotid bruit is not present. No thyromegaly present.  Cardiovascular: Normal rate, regular rhythm, normal heart sounds and intact distal pulses.  Exam reveals no gallop.   Pulmonary/Chest: Effort normal and breath sounds normal. No respiratory distress. She has no wheezes.  Abdominal: Soft. Bowel sounds are normal. She exhibits no distension, no abdominal bruit and no mass. There is no tenderness.  Genitourinary: No breast swelling, tenderness, discharge or bleeding.       Breast exam: No mass, nodules, thickening, tenderness, bulging, retraction, inflamation, nipple discharge or skin changes noted.  No axillary or clavicular LA.  Chaperoned exam.    Musculoskeletal: Normal range of motion. She exhibits no edema and no tenderness.  Lymphadenopathy:    She has no cervical adenopathy.  Neurological: She is alert and oriented to person, place, and time. She has normal reflexes. No cranial nerve deficit. She exhibits normal muscle tone. Coordination normal.  Skin: Skin is warm and dry. No rash noted. No erythema. No pallor.       Solar lentigos diffusely   Psychiatric: She has a normal mood and affect.          Assessment & Plan:

## 2012-02-03 NOTE — Assessment & Plan Note (Signed)
bp in fair control at this time  No changes needed  Disc lifstyle change with low sodium diet and exercise  Lab today  Med refilled  

## 2012-02-04 LAB — VITAMIN D 25 HYDROXY (VIT D DEFICIENCY, FRACTURES): Vit D, 25-Hydroxy: 43 ng/mL (ref 30–89)

## 2012-04-28 ENCOUNTER — Emergency Department (INDEPENDENT_AMBULATORY_CARE_PROVIDER_SITE_OTHER)
Admission: EM | Admit: 2012-04-28 | Discharge: 2012-04-28 | Disposition: A | Discharge status: Home / Self Care | Payer: Medicare Other | Source: Home / Self Care

## 2012-04-28 ENCOUNTER — Encounter (HOSPITAL_COMMUNITY): Payer: Self-pay | Admitting: Emergency Medicine

## 2012-04-28 ENCOUNTER — Telehealth: Payer: Self-pay

## 2012-04-28 ENCOUNTER — Emergency Department (INDEPENDENT_AMBULATORY_CARE_PROVIDER_SITE_OTHER): Payer: Medicare Other

## 2012-04-28 DIAGNOSIS — S6000XA Contusion of unspecified finger without damage to nail, initial encounter: Secondary | ICD-10-CM

## 2012-04-28 DIAGNOSIS — S61209A Unspecified open wound of unspecified finger without damage to nail, initial encounter: Secondary | ICD-10-CM

## 2012-04-28 DIAGNOSIS — S61219A Laceration without foreign body of unspecified finger without damage to nail, initial encounter: Secondary | ICD-10-CM

## 2012-04-28 DIAGNOSIS — S6990XA Unspecified injury of unspecified wrist, hand and finger(s), initial encounter: Secondary | ICD-10-CM | POA: Diagnosis not present

## 2012-04-28 MED ORDER — CLINDAMYCIN HCL 300 MG PO CAPS: 300.0000 mg | ORAL_CAPSULE | Freq: Two times a day (BID) | ORAL | Status: AC

## 2012-04-28 NOTE — ED Notes (Signed)
Dr Jeanella [place]

## 2012-04-28 NOTE — Telephone Encounter (Signed)
Pt fell last night 9:30 pm abrasion to face and cut to rt middle finger joint; hurts to move and ? Needs stiches or antibiotic. Pt states had tdap within last 10 years. Dr Milinda Antis not sure if can suture but advise to get eval at [facility]. Pt said she will go to [facility] now.

## 2012-04-28 NOTE — ED Notes (Signed)
Provided warm blankets, repositioned bed.

## 2012-04-28 NOTE — ED Notes (Signed)
Tetanus 2006

## 2012-04-28 NOTE — Telephone Encounter (Signed)
Aware. 

## 2012-04-28 NOTE — ED Provider Notes (Signed)
History     CSN: [id number]  Arrival date & time 04/28/12  1513   None     Chief Complaint  Patient presents with  . Laceration    (Consider location/radiation/quality/duration/timing/severity/associated sxs/prior treatment) HPI Comments: Pushing a trash can last night at 9:30PM and tripped and fell,  Placing her hands in front of face she landed on the pavement and received a superficial laceration over the PIP of long finger. The finger is ecchymotic, swollen and tender the length of the digit. Sensation intact, Flexion intact partial range, no deformity.  Last TDAP 2006.  Denies other trauma although adjacent knuckles have minor abrasions.   Patient is a 66 y.o. female presenting with skin laceration.  Laceration     Past Medical History  Diagnosis Date  . Personal history of other malignant neoplasm of skin   . Benign neoplasm of colon   . Other abnormal glucose   . Other and unspecified hyperlipidemia   . Unspecified essential hypertension   . MVP (mitral valve prolapse)     very mild    Past Surgical History  Procedure Date  . Mohs surgery 11/09    for basal cell lesion  . Cardiac catheterization 1995    normal (after abnormal stress test)  . Colonoscopy 07/01/03    polyps    Family History  Problem Relation Age of Onset  . Macular degeneration Mother   . Sudden death Father 85  . Hyperlipidemia Brother   . Coronary artery disease Brother     CABG  . Hypertension Brother     History  Substance Use Topics  . Smoking status: Never Smoker   . Smokeless tobacco: Not on file  . Alcohol Use: Yes     Regular    OB History    Grav Para Term Preterm Abortions TAB SAB Ect Mult Living                  Review of Systems  Constitutional: Negative.   HENT: Negative.   Respiratory: Negative.   Musculoskeletal: Positive for joint swelling.       See HPI  Skin: Positive for wound.  Neurological: Negative.   Hematological: Negative.     Psychiatric/Behavioral: Negative.     Allergies  Ampicillin; Calcium; and Cephalexin  Home Medications   Current Outpatient Rx  Name Route Sig Dispense Refill  . ATENOLOL 25 MG PO TABS Oral Take 1 tablet (25 mg total) by mouth daily. 90 tablet 3  . ATORVASTATIN CALCIUM 20 MG PO TABS Oral Take 1 tablet (20 mg total) by mouth daily. 90 tablet 3  . CLINDAMYCIN HCL 300 MG PO CAPS Oral Take 1 capsule (300 mg total) by mouth 2 (two) times daily. 14 capsule 0  . LISINOPRIL 20 MG PO TABS Oral Take 1 tablet (20 mg total) by mouth daily. 90 tablet 3    BP 157/76  Pulse 82  Temp 98.8 F (37.1 C) (Oral)  Resp 20  SpO2 98%  Physical Exam  Constitutional: She is oriented to person, place, and time. She appears well-developed and well-nourished.  HENT:  Head: Normocephalic.  Neck: Normal range of motion. Neck supple.  Musculoskeletal:       R long finger with swelling and ecchymosis. Ragged laceration of about 1cm over PIP. Now well over 16 hrs old. Finger is tender all areas equally, no deformity. Distal sensation intact. Flex /ext intact but with decrease ROM due to pain and swelling.   Neurological: She is  alert and oriented to person, place, and time.  Skin: Skin is warm and dry.  Psychiatric: She has a normal mood and affect.    ED Course  SPLINT APPLICATION Date/Time: 04/28/2012 11:27 PM Performed by: Phineas Real, Stonewall Doss Authorized by: Phineas Real, Bobie Caris Consent: Verbal consent obtained. Patient identity confirmed: verbally with patient Location details: right long finger Splint type: static finger Supplies used: aluminum splint Post-procedure: The splinted body part was neurovascularly unchanged following the procedure. Patient tolerance: Patient tolerated the procedure well with no immediate complications. Comments: Neosporin and dressing applied to wound prior to splinting.    (including critical care time)  Labs Reviewed - No data to display Dg Hand Complete Right  04/28/2012   *RADIOLOGY REPORT*  Clinical Data: Status post fall  RIGHT HAND - COMPLETE 3+ VIEW  Comparison: None.  Findings: There are mild degenerative changes involving the distal interphalangeal joints.  There is no evidence of fracture or dislocation.  Soft tissues are unremarkable.  IMPRESSION:  No acute findings.   Original Report Authenticated By: Rosealee Albee, M.D.      1. Laceration of finger of right hand   2. Contusion of finger, right       MDM  Dg Hand Complete Right  04/28/2012  *RADIOLOGY REPORT*  Clinical Data: Status post fall  RIGHT HAND - COMPLETE 3+ VIEW  Comparison: None.  Findings: There are mild degenerative changes involving the distal interphalangeal joints.  There is no evidence of fracture or dislocation.  Soft tissues are unremarkable.  IMPRESSION:  No acute findings.   Original Report Authenticated By: Rosealee Albee, M.D.      UTD on Tdap Have cleaned with NS and betadine soak.  Apply dressing to PIP jt wound Splint finger in POF Clindamycin 300 bid for 7 d Rechk promptly if worse or infected as discussed.         Hayden Rasmussen, NP 04/28/12 2330

## 2012-04-28 NOTE — ED Notes (Signed)
Patient in radiology

## 2012-04-28 NOTE — ED Notes (Signed)
Fell while walking/carrying trash cans out, fell with can and cut finger.  Right middle finger injured.  Finger with laceration, bruising, swelling.

## 2012-04-29 NOTE — ED Provider Notes (Signed)
Medical screening examination/treatment/procedure(s) were performed by resident physician or non-physician practitioner and as supervising physician I was immediately available for consultation/collaboration.   Jovahn Breit DOUGLAS MD.    Alexzander Dolinger D Jeury Mcnab, MD 04/29/12 2128 

## 2012-06-02 DIAGNOSIS — Z23 Encounter for immunization: Secondary | ICD-10-CM | POA: Diagnosis not present

## 2013-02-03 ENCOUNTER — Encounter: Payer: Self-pay | Admitting: Family Medicine

## 2013-02-03 ENCOUNTER — Ambulatory Visit (INDEPENDENT_AMBULATORY_CARE_PROVIDER_SITE_OTHER): Payer: Medicare Other | Admitting: Family Medicine

## 2013-02-03 VITALS — BP 130/72 | HR 67 | Temp 98.1°F | Ht 59.5 in | Wt 127.0 lb

## 2013-02-03 DIAGNOSIS — I10 Essential (primary) hypertension: Secondary | ICD-10-CM

## 2013-02-03 DIAGNOSIS — R7309 Other abnormal glucose: Secondary | ICD-10-CM

## 2013-02-03 DIAGNOSIS — M899 Disorder of bone, unspecified: Secondary | ICD-10-CM

## 2013-02-03 DIAGNOSIS — Z0000 Encounter for general adult medical examination without abnormal findings: Secondary | ICD-10-CM | POA: Diagnosis not present

## 2013-02-03 DIAGNOSIS — M8580 Other specified disorders of bone density and structure, unspecified site: Secondary | ICD-10-CM

## 2013-02-03 DIAGNOSIS — E785 Hyperlipidemia, unspecified: Secondary | ICD-10-CM

## 2013-02-03 LAB — CBC WITH DIFFERENTIAL/PLATELET
Basophils Absolute: 0.1 10*3/uL (ref 0.0–0.1)
Eosinophils Relative: 0.4 % (ref 0.0–5.0)
Lymphocytes Relative: 22.9 % (ref 12.0–46.0)
Monocytes Relative: 7.9 % (ref 3.0–12.0)
Neutrophils Relative %: 68.2 % (ref 43.0–77.0)
Platelets: 282 10*3/uL (ref 150.0–400.0)
RDW: 15 % — ABNORMAL HIGH (ref 11.5–14.6)
WBC: 8 10*3/uL (ref 4.5–10.5)

## 2013-02-03 LAB — COMPREHENSIVE METABOLIC PANEL
Albumin: 4.1 g/dL (ref 3.5–5.2)
Alkaline Phosphatase: 67 U/L (ref 39–117)
BUN: 16 mg/dL (ref 6–23)
Glucose, Bld: 99 mg/dL (ref 70–99)
Potassium: 4.5 mEq/L (ref 3.5–5.1)
Total Bilirubin: 0.5 mg/dL (ref 0.3–1.2)

## 2013-02-03 LAB — LIPID PANEL
Cholesterol: 202 mg/dL — ABNORMAL HIGH (ref 0–200)
HDL: 74.7 mg/dL (ref >39.00)
VLDL: 23.8 mg/dL (ref 0.0–40.0)

## 2013-02-03 LAB — TSH: TSH: 1.01 u[IU]/mL (ref 0.35–5.50)

## 2013-02-03 LAB — LDL CHOLESTEROL, DIRECT: Direct LDL: 106.5 mg/dL

## 2013-02-03 LAB — HEMOGLOBIN A1C: Hgb A1c MFr Bld: 6.1 % (ref 4.6–6.5)

## 2013-02-03 MED ORDER — ZOSTER VACCINE LIVE 19400 UNT/0.65ML ~~LOC~~ SOLR: 0.6500 mL | Freq: Once | SUBCUTANEOUS | Status: DC

## 2013-02-03 MED ORDER — LISINOPRIL 20 MG PO TABS: 20.0000 mg | ORAL_TABLET | Freq: Every day | ORAL | Status: DC

## 2013-02-03 MED ORDER — ATENOLOL 25 MG PO TABS: 25.0000 mg | ORAL_TABLET | Freq: Every day | ORAL | Status: DC

## 2013-02-03 MED ORDER — ATORVASTATIN CALCIUM 20 MG PO TABS: 20.0000 mg | ORAL_TABLET | Freq: Every day | ORAL | Status: DC

## 2013-02-03 NOTE — Patient Instructions (Addendum)
Here is a px for the zoster vaccine - to take to a pharmacy when you are ready Don't forget to get your mammogram scheduled Labs today Take care of yourself

## 2013-02-03 NOTE — Progress Notes (Signed)
Subjective:    Patient ID: Cindy [name], female    DOB: 08-06-1946, 67 y.o.   MRN: [id number]  HPI I have personally reviewed the Medicare Annual Wellness questionnaire and have noted 1. The patient's medical and social history 2. Their use of alcohol, tobacco or illicit drugs 3. Their current medications and supplements 4. The patient's functional ability including ADL's, fall risks, home safety risks and hearing or visual             impairment. 5. Diet and physical activities 6. Evidence for depression or mood disorders  A lot of stress  Husband will be admitted to a nursing home tomorrow- she can no longer take care of him  Needs to take care of herself  No new medical problems - except for a finger injury (putting out a trash can at night - it got away from her , and she caught herself with her R hand - smashed her finger) Urgent care saw her - and had to wear a splint (open wound)    The patients weight, height, BMI have been recorded in the chart and visual acuity is per eye clinic.  I have made referrals, counseling and provided education to the patient based review of the above and I have provided the pt with a written personalized care plan for preventive services.  See scanned forms.  Routine anticipatory guidance given to patient.  See health maintenance. Flu 10/13 imm Shingles- needs px to take to pharmacy PNA 3/12 vaccine  Tetanus 1/06 imm  Colon 8/11 f/u of polyps - 5-7 year recall  Breast cancer screening 2/13 - has not had a chance to get it  Nl self exam  Advance directive - has a living will  Cognitive function addressed- see scanned forms- and if abnormal then additional documentation follows. No concerns overall    PMH and SH reviewed  Meds, vitals, and allergies reviewed.   ROS: See HPI.  Otherwise negative.    Falls-none   (had a trip and caught herself but did not fall)  Balance is not bad overall   Mood- stressed , but mood is overall good-no  depression   Wt is down 3 lb  Does well mt her weight   Pap 1/11 normal - and does not see gyn  No gyn problems and no hysterectomy  Not sexually active and no abn paps   bp is stable today  No cp or palpitations or headaches or edema  No side effects to medicines  BP Readings from Last 3 Encounters:  02/03/13 130/72  04/28/12 157/76  02/03/12 128/76      Hyperlipidemia  Lab Results  Component Value Date   CHOL 182 02/03/2012   CHOL 186 10/10/2010   CHOL 160 09/19/2009   Lab Results  Component Value Date   HDL 70.40 02/03/2012   HDL 40.98 10/10/2010   HDL 11.91 09/19/2009   Lab Results  Component Value Date   LDLCALC 73 02/03/2012   LDLCALC 84 10/10/2010   LDLCALC 61 09/19/2009   Lab Results  Component Value Date   TRIG 193.0* 02/03/2012   TRIG 199.0* 10/10/2010   TRIG 151.0* 09/19/2009   Lab Results  Component Value Date   CHOLHDL 3 02/03/2012   CHOLHDL 3 10/10/2010   CHOLHDL 2 09/19/2009   No results found for this basename: LDLDIRECT  no formal exercise- but will be able to soon    Hyperglycemia Lab Results  Component Value Date   HGBA1C 6.0 02/03/2012  due for lab    Patient Active Problem List   Diagnosis Date Noted  . Encounter for Medicare annual wellness exam 02/03/2013  . Osteopenia 02/03/2012  . HYPERGLYCEMIA 10/10/2010  . COLONIC POLYPS 03/05/2010  . HYPERLIPIDEMIA 07/27/2009  . HYPERTENSION 07/27/2009  . BASAL CELL CARCINOMA, HX OF 07/27/2009   Past Medical History  Diagnosis Date  . Personal history of other malignant neoplasm of skin   . Benign neoplasm of colon   . Other abnormal glucose   . Other and unspecified hyperlipidemia   . Unspecified essential hypertension   . MVP (mitral valve prolapse)     very mild   Past Surgical History  Procedure Laterality Date  . Mohs surgery  11/09    for basal cell lesion  . Cardiac catheterization  1995    normal (after abnormal stress test)  . Colonoscopy  07/01/03    polyps   History   Substance Use Topics  . Smoking status: Never Smoker   . Smokeless tobacco: Not on file  . Alcohol Use: Yes     Comment: Regular   Family History  Problem Relation Age of Onset  . Macular degeneration Mother   . Sudden death Father 51  . Hyperlipidemia Brother   . Coronary artery disease Brother     CABG  . Hypertension Brother    Allergies  Allergen Reactions  . Ampicillin     REACTION: rash  . Calcium     REACTION: GI intolerance  . Cephalexin     REACTION: rash   Current Outpatient Prescriptions on File Prior to Visit  Medication Sig Dispense Refill  . atenolol (TENORMIN) 25 MG tablet Take 1 tablet (25 mg total) by mouth daily.  90 tablet  3  . atorvastatin (LIPITOR) 20 MG tablet Take 1 tablet (20 mg total) by mouth daily.  90 tablet  3  . lisinopril (PRINIVIL,ZESTRIL) 20 MG tablet Take 1 tablet (20 mg total) by mouth daily.  90 tablet  3   No current facility-administered medications on file prior to visit.    Review of Systems Review of Systems  Constitutional: Negative for fever, appetite change, fatigue and unexpected weight change.  Eyes: Negative for pain and visual disturbance.  Respiratory: Negative for cough and shortness of breath.   Cardiovascular: Negative for cp or palpitations    Gastrointestinal: Negative for nausea, diarrhea and constipation.  Genitourinary: Negative for urgency and frequency.  Skin: Negative for pallor or rash   Neurological: Negative for weakness, light-headedness, numbness and headaches.  Hematological: Negative for adenopathy. Does not bruise/bleed easily.  Psychiatric/Behavioral: Negative for dysphoric mood. The patient is not nervous/anxious.  pos for stressors        Objective:   Physical Exam  Constitutional: She appears well-developed and well-nourished. No distress.  HENT:  Head: Normocephalic and atraumatic.  Right Ear: External ear normal.  Left Ear: External ear normal.  Nose: Nose normal.  Mouth/Throat:  Oropharynx is clear and moist.  Eyes: Conjunctivae and EOM are normal. Pupils are equal, round, and reactive to light. Right eye exhibits no discharge. Left eye exhibits no discharge. No scleral icterus.  Neck: Normal range of motion. Neck supple. No JVD present. Carotid bruit is not present. No thyromegaly present.  Cardiovascular: Normal rate, regular rhythm, normal heart sounds and intact distal pulses.  Exam reveals no gallop.   Pulmonary/Chest: Effort normal and breath sounds normal. No respiratory distress. She has no wheezes. She has no rales.  Abdominal: Soft. Bowel sounds  are normal. She exhibits no distension, no abdominal bruit and no mass. There is no tenderness.  Genitourinary: No breast swelling, tenderness, discharge or bleeding.  Breast exam: No mass, nodules, thickening, tenderness, bulging, retraction, inflamation, nipple discharge or skin changes noted.  No axillary or clavicular LA.  Chaperoned exam.    Musculoskeletal: Normal range of motion. She exhibits no edema and no tenderness.  Lymphadenopathy:    She has no cervical adenopathy.  Neurological: She is alert. She has normal reflexes. No cranial nerve deficit. She exhibits normal muscle tone. Coordination normal.  Skin: Skin is warm and dry. No rash noted. No erythema. No pallor.  Psychiatric: She has a normal mood and affect.          Assessment & Plan:

## 2013-02-04 NOTE — Assessment & Plan Note (Signed)
bp in fair control at this time  No changes needed  Disc lifstyle change with low sodium diet and exercise  Lab today 

## 2013-02-04 NOTE — Assessment & Plan Note (Signed)
Pt declines further dexa Disc imp of ca and D supplementation and also exercise Disc safety and fall/ fx prevention

## 2013-02-04 NOTE — Assessment & Plan Note (Signed)
Lipid panel today  Disc goals for lipids and reasons to control them Rev low sat fat diet in detail

## 2013-02-04 NOTE — Assessment & Plan Note (Signed)
Controlled by diet Will get back to better exercise now that she will have some time for self care  a1c today

## 2013-02-04 NOTE — Assessment & Plan Note (Signed)
Reviewed health habits including diet and exercise and skin cancer prevention Also reviewed health mt list, fam hx and immunizations  See HPI Wellness labs today

## 2013-02-05 ENCOUNTER — Encounter: Payer: Self-pay | Admitting: *Deleted

## 2013-02-25 ENCOUNTER — Other Ambulatory Visit: Payer: Self-pay

## 2013-02-25 DIAGNOSIS — Z1231 Encounter for screening mammogram for malignant neoplasm of breast: Secondary | ICD-10-CM

## 2013-03-18 ENCOUNTER — Ambulatory Visit
Admission: RE | Admit: 2013-03-18 | Discharge: 2013-03-18 | Disposition: A | Discharge status: Home / Self Care | Payer: Medicare Other | Source: Ambulatory Visit

## 2013-03-18 DIAGNOSIS — Z1231 Encounter for screening mammogram for malignant neoplasm of breast: Secondary | ICD-10-CM

## 2013-06-01 DIAGNOSIS — Z23 Encounter for immunization: Secondary | ICD-10-CM | POA: Diagnosis not present

## 2014-02-07 ENCOUNTER — Ambulatory Visit (INDEPENDENT_AMBULATORY_CARE_PROVIDER_SITE_OTHER): Payer: Medicare Other | Admitting: Family Medicine

## 2014-02-07 ENCOUNTER — Encounter: Payer: Self-pay | Admitting: Family Medicine

## 2014-02-07 VITALS — BP 128/80 | HR 63 | Temp 98.5°F | Ht 59.75 in | Wt 131.8 lb

## 2014-02-07 DIAGNOSIS — M949 Disorder of cartilage, unspecified: Secondary | ICD-10-CM

## 2014-02-07 DIAGNOSIS — R7309 Other abnormal glucose: Secondary | ICD-10-CM | POA: Diagnosis not present

## 2014-02-07 DIAGNOSIS — E785 Hyperlipidemia, unspecified: Secondary | ICD-10-CM

## 2014-02-07 DIAGNOSIS — M8580 Other specified disorders of bone density and structure, unspecified site: Secondary | ICD-10-CM

## 2014-02-07 DIAGNOSIS — M899 Disorder of bone, unspecified: Secondary | ICD-10-CM

## 2014-02-07 DIAGNOSIS — I10 Essential (primary) hypertension: Secondary | ICD-10-CM | POA: Diagnosis not present

## 2014-02-07 DIAGNOSIS — Z0000 Encounter for general adult medical examination without abnormal findings: Secondary | ICD-10-CM | POA: Diagnosis not present

## 2014-02-07 LAB — LIPID PANEL
Cholesterol: 193 mg/dL (ref 0–200)
HDL: 64.2 mg/dL (ref >39.00)
LDL Cholesterol: 76 mg/dL (ref 0–99)
NONHDL: 128.8
Total CHOL/HDL Ratio: 3
Triglycerides: 264 mg/dL — ABNORMAL HIGH (ref 0.0–149.0)
VLDL: 52.8 mg/dL — AB (ref 0.0–40.0)

## 2014-02-07 LAB — TSH: TSH: 0.45 u[IU]/mL (ref 0.35–4.50)

## 2014-02-07 LAB — COMPREHENSIVE METABOLIC PANEL
ALBUMIN: 4.3 g/dL (ref 3.5–5.2)
ALK PHOS: 51 U/L (ref 39–117)
ALT: 26 U/L (ref 0–35)
AST: 30 U/L (ref 0–37)
BUN: 21 mg/dL (ref 6–23)
CHLORIDE: 103 meq/L (ref 96–112)
CO2: 24 meq/L (ref 19–32)
Calcium: 9.6 mg/dL (ref 8.4–10.5)
Creatinine, Ser: 0.9 mg/dL (ref 0.4–1.2)
GFR: 63.63 mL/min (ref >60.00)
GLUCOSE: 117 mg/dL — AB (ref 70–99)
POTASSIUM: 4.3 meq/L (ref 3.5–5.1)
SODIUM: 136 meq/L (ref 135–145)
TOTAL PROTEIN: 7.5 g/dL (ref 6.0–8.3)
Total Bilirubin: 0.4 mg/dL (ref 0.2–1.2)

## 2014-02-07 LAB — CBC WITH DIFFERENTIAL/PLATELET
Basophils Absolute: 0.1 10*3/uL (ref 0.0–0.1)
Basophils Relative: 0.6 % (ref 0.0–3.0)
EOS PCT: 1.1 % (ref 0.0–5.0)
Eosinophils Absolute: 0.1 10*3/uL (ref 0.0–0.7)
HCT: 34.7 % — ABNORMAL LOW (ref 36.0–46.0)
Hemoglobin: 11.7 g/dL — ABNORMAL LOW (ref 12.0–15.0)
LYMPHS PCT: 24.2 % (ref 12.0–46.0)
Lymphs Abs: 2.2 10*3/uL (ref 0.7–4.0)
MCHC: 33.7 g/dL (ref 30.0–36.0)
MCV: 95 fl (ref 78.0–100.0)
MONO ABS: 0.8 10*3/uL (ref 0.1–1.0)
MONOS PCT: 8.7 % (ref 3.0–12.0)
NEUTROS PCT: 65.4 % (ref 43.0–77.0)
Neutro Abs: 5.9 10*3/uL (ref 1.4–7.7)
PLATELETS: 244 10*3/uL (ref 150.0–400.0)
RBC: 3.65 Mil/uL — ABNORMAL LOW (ref 3.87–5.11)
RDW: 14.3 % (ref 11.5–15.5)
WBC: 9 10*3/uL (ref 4.0–10.5)

## 2014-02-07 LAB — HEMOGLOBIN A1C: HEMOGLOBIN A1C: 6.2 % (ref 4.6–6.5)

## 2014-02-07 LAB — VITAMIN D 25 HYDROXY (VIT D DEFICIENCY, FRACTURES): VITD: 31.94 ng/mL

## 2014-02-07 MED ORDER — LISINOPRIL 20 MG PO TABS: 20.0000 mg | ORAL_TABLET | Freq: Every day | ORAL | Status: DC

## 2014-02-07 MED ORDER — ATORVASTATIN CALCIUM 20 MG PO TABS: 20.0000 mg | ORAL_TABLET | Freq: Every day | ORAL | Status: DC

## 2014-02-07 MED ORDER — ATENOLOL 25 MG PO TABS: 25.0000 mg | ORAL_TABLET | Freq: Every day | ORAL | Status: DC

## 2014-02-07 NOTE — Assessment & Plan Note (Signed)
Reviewed health habits including diet and exercise and skin cancer prevention Reviewed appropriate screening tests for age  Also reviewed health mt list, fam hx and immunization status , as well as social and family history   See HPI Lab today 

## 2014-02-07 NOTE — Assessment & Plan Note (Signed)
A1C today Disc imp of low glycemic diet and exercise and wt control

## 2014-02-07 NOTE — Assessment & Plan Note (Signed)
Lipid panel today  Statin and diet  Rev low sat fat diet

## 2014-02-07 NOTE — Assessment & Plan Note (Signed)
bp in fair control at this time  BP Readings from Last 1 Encounters:  02/07/14 128/80   No changes needed Disc lifstyle change with low sodium diet and exercise  Labs today

## 2014-02-07 NOTE — Progress Notes (Signed)
Pre visit review using our clinic review tool, if applicable. No additional management support is needed unless otherwise documented below in the visit note. 

## 2014-02-07 NOTE — Patient Instructions (Signed)
Labs today  Don't forget to schedule your mammogram  A Prevar vaccine is a good idea- check into it at your pharmacy- you can also get it here  Take care of yourself

## 2014-02-07 NOTE — Assessment & Plan Note (Signed)
Pt declines dexa  No falls or fx Does exercise Check D today  Disc need for calcium/ vitamin D/ wt bearing exercise and bone density test every 2 y to monitor Disc safety/ fracture risk in detail

## 2014-02-07 NOTE — Progress Notes (Signed)
Subjective:    Patient ID: Cindy [name], female    DOB: 05/19/1946, 68 y.o.   MRN: [id number]  HPI I have personally reviewed the Medicare Annual Wellness questionnaire and have noted 1. The patient's medical and social history 2. Their use of alcohol, tobacco or illicit drugs 3. Their current medications and supplements 4. The patient's functional ability including ADL's, fall risks, home safety risks and hearing or visual             impairment. 5. Diet and physical activities 6. Evidence for depression or mood disorders  The patients weight, height, BMI have been recorded in the chart and visual acuity is per eye clinic.  I have made referrals, counseling and provided education to the patient based review of the above and I have provided the pt with a written personalized care plan for preventive services.  Is doing fine overall   Wt is up 4 lb with bmi of 25   Her husband passed away since last visit  She is doing ok with grief - (he had parkinsons with dementia) -- she has some relief also  Has more time to care for herself  Is eating healthier and added exercise -gym - happy about that   See scanned forms.  Routine anticipatory guidance given to patient.  See health maintenance. Colon cancer screening colonosc 8/11 - 5-7 year recall  Breast cancer screening mammo 7/14 - she just got her letter and will schedule her own  Self breast exam- no breast lumps  Does not see a gyn - no problems / no hx of problems and not sexually active  Flu vaccine 10/14- will get another in the fall  Tetanus vaccine 1/06  Pneumovax 3/12  , she will get a prevnar at [facility]  Zoster vaccine 9/14  Advance directive she has a living will  Cognitive function addressed- see scanned forms- and if abnormal then additional documentation follows. No worries about memory   PMH and SH reviewed  Meds, vitals, and allergies reviewed.   ROS: See HPI.  Otherwise negative.    Due for labs today    bp is stable today  No cp or palpitations or headaches or edema  No side effects to medicines  BP Readings from Last 3 Encounters:  02/07/14 128/80  02/03/13 130/72  04/28/12 157/76      Hyperlipidemia - lipitor and diet  Due for labs   Bone density test - does not want a dexa scan  She would never take med for fx prev anyway Is cautious with fall prev Exercises  Takes her ca and D   Patient Active Problem List   Diagnosis Date Noted  . Encounter for Medicare annual wellness exam 02/03/2013  . Osteopenia 02/03/2012  . HYPERGLYCEMIA 10/10/2010  . COLONIC POLYPS 03/05/2010  . HYPERLIPIDEMIA 07/27/2009  . HYPERTENSION 07/27/2009  . BASAL CELL CARCINOMA, HX OF 07/27/2009   Past Medical History  Diagnosis Date  . Personal history of other malignant neoplasm of skin   . Benign neoplasm of colon   . Other abnormal glucose   . Other and unspecified hyperlipidemia   . Unspecified essential hypertension   . MVP (mitral valve prolapse)     very mild   Past Surgical History  Procedure Laterality Date  . Mohs surgery  11/09    for basal cell lesion  . Cardiac catheterization  1995    normal (after abnormal stress test)  . Colonoscopy  07/01/03    polyps  History  Substance Use Topics  . Smoking status: Never Smoker   . Smokeless tobacco: Not on file  . Alcohol Use: Yes     Comment: Regular   Family History  Problem Relation Age of Onset  . Macular degeneration Mother   . Sudden death Father 43  . Hyperlipidemia Brother   . Coronary artery disease Brother     CABG  . Hypertension Brother    Allergies  Allergen Reactions  . Ampicillin     REACTION: rash  . Calcium     REACTION: GI intolerance  . Cephalexin     REACTION: rash   Current Outpatient Prescriptions on File Prior to Visit  Medication Sig Dispense Refill  . atenolol (TENORMIN) 25 MG tablet Take 1 tablet (25 mg total) by mouth daily.  90 tablet  3  . atorvastatin (LIPITOR) 20 MG tablet Take 1  tablet (20 mg total) by mouth daily.  90 tablet  3  . lisinopril (PRINIVIL,ZESTRIL) 20 MG tablet Take 1 tablet (20 mg total) by mouth daily.  90 tablet  3   No current facility-administered medications on file prior to visit.      Review of Systems Review of Systems  Constitutional: Negative for fever, appetite change, fatigue and unexpected weight change.  Eyes: Negative for pain and visual disturbance.  Respiratory: Negative for cough and shortness of breath.   Cardiovascular: Negative for cp or palpitations    Gastrointestinal: Negative for nausea, diarrhea and constipation.  Genitourinary: Negative for urgency and frequency.  Skin: Negative for pallor or rash   Neurological: Negative for weakness, light-headedness, numbness and headaches.  Hematological: Negative for adenopathy. Does not bruise/bleed easily.  Psychiatric/Behavioral: Negative for dysphoric mood. The patient is not nervous/anxious.  pos for grief at times which she handles well        Objective:   Physical Exam  Constitutional: She appears well-developed and well-nourished. No distress.  HENT:  Head: Normocephalic and atraumatic.  Right Ear: External ear normal.  Left Ear: External ear normal.  Mouth/Throat: Oropharynx is clear and moist.  Eyes: Conjunctivae and EOM are normal. Pupils are equal, round, and reactive to light. No scleral icterus.  Neck: Normal range of motion. Neck supple. No JVD present. Carotid bruit is not present. No thyromegaly present.  Cardiovascular: Normal rate, regular rhythm, normal heart sounds and intact distal pulses.  Exam reveals no gallop.   Pulmonary/Chest: Effort normal and breath sounds normal. No respiratory distress. She has no wheezes. She exhibits no tenderness.  Abdominal: Soft. Bowel sounds are normal. She exhibits no distension, no abdominal bruit and no mass. There is no tenderness.  Genitourinary: No breast swelling, tenderness, discharge or bleeding.  Breast exam: No  mass, nodules, thickening, tenderness, bulging, retraction, inflamation, nipple discharge or skin changes noted.  No axillary or clavicular LA.      Musculoskeletal: Normal range of motion. She exhibits no edema and no tenderness.  No kyphosis   Lymphadenopathy:    She has no cervical adenopathy.  Neurological: She is alert. She has normal reflexes. No cranial nerve deficit. She exhibits normal muscle tone. Coordination normal.  Skin: Skin is warm and dry. No rash noted. No erythema. No pallor.  Psychiatric: She has a normal mood and affect.          Assessment & Plan:   Problem List Items Addressed This Visit     Cardiovascular and Mediastinum   HYPERTENSION      bp in fair control at this  time  BP Readings from Last 1 Encounters:  02/07/14 128/80   No changes needed Disc lifstyle change with low sodium diet and exercise  Labs today    Relevant Medications      atenolol (TENORMIN) tablet      atorvastatin (LIPITOR) tablet      lisinopril (PRINIVIL,ZESTRIL) tablet   Other Relevant Orders      CBC with Differential (Completed)      Comprehensive metabolic panel (Completed)      TSH (Completed)      Lipid panel (Completed)     Musculoskeletal and Integument   Osteopenia     Pt declines dexa  No falls or fx Does exercise Check D today  Disc need for calcium/ vitamin D/ wt bearing exercise and bone density test every 2 y to monitor Disc safety/ fracture risk in detail      Relevant Orders      Vit D  25 hydroxy (rtn osteoporosis monitoring) (Completed)     Other   HYPERLIPIDEMIA     Lipid panel today  Statin and diet  Rev low sat fat diet     Relevant Medications      atenolol (TENORMIN) tablet      atorvastatin (LIPITOR) tablet      lisinopril (PRINIVIL,ZESTRIL) tablet   Other Relevant Orders      Lipid panel (Completed)   HYPERGLYCEMIA     A1C today Disc imp of low glycemic diet and exercise and wt control     Relevant Orders      Hemoglobin A1c  (Completed)   Encounter for Medicare annual wellness exam - Primary     Reviewed health habits including diet and exercise and skin cancer prevention Reviewed appropriate screening tests for age  Also reviewed health mt list, fam hx and immunization status , as well as social and family history   See HPI Lab today

## 2014-02-08 ENCOUNTER — Telehealth: Payer: Self-pay | Admitting: Family Medicine

## 2014-02-08 NOTE — Telephone Encounter (Signed)
Relevant patient education assigned to patient using Emmi. ° °

## 2014-02-10 ENCOUNTER — Encounter: Payer: Self-pay | Admitting: Family Medicine

## 2014-02-14 ENCOUNTER — Telehealth: Payer: Self-pay | Admitting: *Deleted

## 2014-02-14 DIAGNOSIS — Z1211 Encounter for screening for malignant neoplasm of colon: Secondary | ICD-10-CM

## 2014-02-14 NOTE — Telephone Encounter (Signed)
Pt came in office to pick up Ifob today, order placed

## 2014-02-14 NOTE — Telephone Encounter (Signed)
Message copied by Tammi Sou on Mon Feb 14, 2014 11:08 AM ------      Message from: Loura Pardon A      Created: Mon Feb 07, 2014  7:36 PM       Pt has slight anemia      Please order IFOB card for dx colon cancer screening       thanks ------

## 2014-02-17 ENCOUNTER — Other Ambulatory Visit: Payer: Medicare Other

## 2014-02-17 DIAGNOSIS — Z1211 Encounter for screening for malignant neoplasm of colon: Secondary | ICD-10-CM

## 2014-02-17 LAB — FECAL OCCULT BLOOD, IMMUNOCHEMICAL: Fecal Occult Bld: NEGATIVE

## 2014-02-18 ENCOUNTER — Telehealth: Payer: Self-pay | Admitting: *Deleted

## 2014-02-18 DIAGNOSIS — R5381 Other malaise: Secondary | ICD-10-CM

## 2014-02-18 DIAGNOSIS — R5382 Chronic fatigue, unspecified: Secondary | ICD-10-CM

## 2014-02-18 DIAGNOSIS — D649 Anemia, unspecified: Secondary | ICD-10-CM

## 2014-02-18 DIAGNOSIS — R5383 Other fatigue: Secondary | ICD-10-CM

## 2014-02-18 NOTE — Telephone Encounter (Signed)
Message copied by Tammi Sou on Fri Feb 18, 2014 11:02 AM ------      Message from: Loura Pardon A      Created: Thu Feb 17, 2014  8:54 PM       Pt was inf by [employer] and she will call to set up a lab appt in 2-4 weeks - for cbc with diff/ ferritin and B12 level please - dx is anemia ------

## 2014-02-18 NOTE — Telephone Encounter (Signed)
When putting in the order for b12 with dx of anemia the system advise me that the b12 lab would not be covered by insurance if we use the dx of anemia, is there another dx code I can use for the b12 lab, please advise

## 2014-02-18 NOTE — Telephone Encounter (Signed)
You can try fatigue- she had mentioned that  If this does not work- please order it anyway

## 2014-02-21 DIAGNOSIS — R5381 Other malaise: Secondary | ICD-10-CM | POA: Insufficient documentation

## 2014-02-21 DIAGNOSIS — D649 Anemia, unspecified: Secondary | ICD-10-CM | POA: Insufficient documentation

## 2014-02-21 DIAGNOSIS — R5383 Other fatigue: Secondary | ICD-10-CM | POA: Insufficient documentation

## 2014-02-21 NOTE — Telephone Encounter (Signed)
done

## 2014-02-21 NOTE — Telephone Encounter (Signed)
It will not allow me to sign orders for b12 labs even when I changed the dx code to fatigue, It may be because I'm not a doctor [employer] will not let me override the b12 orders. Orders are still unsigned, you may have to sign orders, please advise

## 2014-02-22 DIAGNOSIS — L570 Actinic keratosis: Secondary | ICD-10-CM | POA: Diagnosis not present

## 2014-02-22 DIAGNOSIS — D485 Neoplasm of uncertain behavior of skin: Secondary | ICD-10-CM | POA: Diagnosis not present

## 2014-02-22 DIAGNOSIS — Z1283 Encounter for screening for malignant neoplasm of skin: Secondary | ICD-10-CM | POA: Diagnosis not present

## 2014-02-22 DIAGNOSIS — Z85828 Personal history of other malignant neoplasm of skin: Secondary | ICD-10-CM | POA: Diagnosis not present

## 2014-03-15 ENCOUNTER — Other Ambulatory Visit (INDEPENDENT_AMBULATORY_CARE_PROVIDER_SITE_OTHER): Payer: Medicare Other

## 2014-03-15 DIAGNOSIS — R5383 Other fatigue: Secondary | ICD-10-CM

## 2014-03-15 DIAGNOSIS — D649 Anemia, unspecified: Secondary | ICD-10-CM | POA: Diagnosis not present

## 2014-03-15 DIAGNOSIS — R5381 Other malaise: Secondary | ICD-10-CM

## 2014-03-15 LAB — CBC WITH DIFFERENTIAL/PLATELET
BASOS ABS: 0 10*3/uL (ref 0.0–0.1)
Basophils Relative: 0.2 % (ref 0.0–3.0)
EOS ABS: 0.1 10*3/uL (ref 0.0–0.7)
Eosinophils Relative: 1.3 % (ref 0.0–5.0)
HCT: 35.9 % — ABNORMAL LOW (ref 36.0–46.0)
Hemoglobin: 12 g/dL (ref 12.0–15.0)
Lymphocytes Relative: 38.7 % (ref 12.0–46.0)
Lymphs Abs: 2.8 10*3/uL (ref 0.7–4.0)
MCHC: 33.5 g/dL (ref 30.0–36.0)
MCV: 95.5 fl (ref 78.0–100.0)
MONO ABS: 0.7 10*3/uL (ref 0.1–1.0)
Monocytes Relative: 10.3 % (ref 3.0–12.0)
NEUTROS PCT: 49.5 % (ref 43.0–77.0)
Neutro Abs: 3.6 10*3/uL (ref 1.4–7.7)
PLATELETS: 301 10*3/uL (ref 150.0–400.0)
RBC: 3.76 Mil/uL — ABNORMAL LOW (ref 3.87–5.11)
RDW: 13.8 % (ref 11.5–15.5)
WBC: 7.2 10*3/uL (ref 4.0–10.5)

## 2014-03-15 LAB — VITAMIN B12: VITAMIN B 12: 325 pg/mL (ref 211–911)

## 2014-03-15 LAB — FERRITIN: FERRITIN: 100.9 ng/mL (ref 10.0–291.0)

## 2014-03-23 ENCOUNTER — Other Ambulatory Visit: Payer: Self-pay

## 2014-03-23 DIAGNOSIS — Z1231 Encounter for screening mammogram for malignant neoplasm of breast: Secondary | ICD-10-CM

## 2014-04-07 ENCOUNTER — Ambulatory Visit
Admission: RE | Admit: 2014-04-07 | Discharge: 2014-04-07 | Disposition: A | Discharge status: Home / Self Care | Payer: Medicare Other | Source: Ambulatory Visit

## 2014-04-07 DIAGNOSIS — Z1231 Encounter for screening mammogram for malignant neoplasm of breast: Secondary | ICD-10-CM

## 2014-04-13 DIAGNOSIS — D485 Neoplasm of uncertain behavior of skin: Secondary | ICD-10-CM | POA: Diagnosis not present

## 2014-05-23 DIAGNOSIS — Z23 Encounter for immunization: Secondary | ICD-10-CM | POA: Diagnosis not present

## 2015-01-12 DIAGNOSIS — Z23 Encounter for immunization: Secondary | ICD-10-CM | POA: Diagnosis not present

## 2015-02-13 ENCOUNTER — Encounter: Payer: Medicare Other | Admitting: Family Medicine

## 2015-02-28 ENCOUNTER — Encounter: Payer: Self-pay | Admitting: Family Medicine

## 2015-02-28 ENCOUNTER — Ambulatory Visit (INDEPENDENT_AMBULATORY_CARE_PROVIDER_SITE_OTHER): Payer: Medicare Other | Admitting: Family Medicine

## 2015-02-28 VITALS — BP 130/80 | HR 62 | Temp 98.4°F | Ht 59.75 in | Wt 133.8 lb

## 2015-02-28 DIAGNOSIS — R739 Hyperglycemia, unspecified: Secondary | ICD-10-CM

## 2015-02-28 DIAGNOSIS — Z0000 Encounter for general adult medical examination without abnormal findings: Secondary | ICD-10-CM

## 2015-02-28 DIAGNOSIS — M8580 Other specified disorders of bone density and structure, unspecified site: Secondary | ICD-10-CM

## 2015-02-28 DIAGNOSIS — E785 Hyperlipidemia, unspecified: Secondary | ICD-10-CM | POA: Diagnosis not present

## 2015-02-28 DIAGNOSIS — I10 Essential (primary) hypertension: Secondary | ICD-10-CM | POA: Diagnosis not present

## 2015-02-28 LAB — CBC WITH DIFFERENTIAL/PLATELET
BASOS PCT: 0.4 % (ref 0.0–3.0)
Basophils Absolute: 0 10*3/uL (ref 0.0–0.1)
EOS ABS: 0.1 10*3/uL (ref 0.0–0.7)
EOS PCT: 1.2 % (ref 0.0–5.0)
HCT: 39.6 % (ref 36.0–46.0)
Hemoglobin: 13.3 g/dL (ref 12.0–15.0)
LYMPHS PCT: 31.8 % (ref 12.0–46.0)
Lymphs Abs: 2.4 10*3/uL (ref 0.7–4.0)
MCHC: 33.6 g/dL (ref 30.0–36.0)
MCV: 93.6 fl (ref 78.0–100.0)
MONO ABS: 0.7 10*3/uL (ref 0.1–1.0)
Monocytes Relative: 9.3 % (ref 3.0–12.0)
NEUTROS PCT: 57.3 % (ref 43.0–77.0)
Neutro Abs: 4.4 10*3/uL (ref 1.4–7.7)
Platelets: 309 10*3/uL (ref 150.0–400.0)
RBC: 4.23 Mil/uL (ref 3.87–5.11)
RDW: 14.8 % (ref 11.5–15.5)
WBC: 7.6 10*3/uL (ref 4.0–10.5)

## 2015-02-28 LAB — LIPID PANEL
CHOL/HDL RATIO: 4
CHOLESTEROL: 193 mg/dL (ref 0–200)
HDL: 54.5 mg/dL (ref >39.00)
NONHDL: 138.5
TRIGLYCERIDES: 273 mg/dL — AB (ref 0.0–149.0)
VLDL: 54.6 mg/dL — ABNORMAL HIGH (ref 0.0–40.0)

## 2015-02-28 LAB — COMPREHENSIVE METABOLIC PANEL
ALBUMIN: 4.5 g/dL (ref 3.5–5.2)
ALT: 33 U/L (ref 0–35)
AST: 32 U/L (ref 0–37)
Alkaline Phosphatase: 67 U/L (ref 39–117)
BUN: 20 mg/dL (ref 6–23)
CALCIUM: 10.2 mg/dL (ref 8.4–10.5)
CHLORIDE: 100 meq/L (ref 96–112)
CO2: 28 mEq/L (ref 19–32)
Creatinine, Ser: 0.91 mg/dL (ref 0.40–1.20)
GFR: 65.04 mL/min (ref >60.00)
Glucose, Bld: 109 mg/dL — ABNORMAL HIGH (ref 70–99)
POTASSIUM: 4.7 meq/L (ref 3.5–5.1)
SODIUM: 137 meq/L (ref 135–145)
TOTAL PROTEIN: 8.2 g/dL (ref 6.0–8.3)
Total Bilirubin: 0.5 mg/dL (ref 0.2–1.2)

## 2015-02-28 LAB — TSH: TSH: 1.96 u[IU]/mL (ref 0.35–4.50)

## 2015-02-28 LAB — LDL CHOLESTEROL, DIRECT: LDL DIRECT: 101 mg/dL

## 2015-02-28 LAB — HEMOGLOBIN A1C: HEMOGLOBIN A1C: 6.1 % (ref 4.6–6.5)

## 2015-02-28 MED ORDER — LISINOPRIL 20 MG PO TABS: 20.0000 mg | ORAL_TABLET | Freq: Every day | ORAL | Status: DC

## 2015-02-28 MED ORDER — ATENOLOL 25 MG PO TABS: 25.0000 mg | ORAL_TABLET | Freq: Every day | ORAL | Status: DC

## 2015-02-28 MED ORDER — ATORVASTATIN CALCIUM 20 MG PO TABS: 20.0000 mg | ORAL_TABLET | Freq: Every day | ORAL | Status: DC

## 2015-02-28 NOTE — Assessment & Plan Note (Signed)
Reviewed health habits including diet and exercise and skin cancer prevention Reviewed appropriate screening tests for age  Also reviewed health mt list, fam hx and immunization status , as well as social and family history   See HPI Labs today for wellness and chronic health problems  Had prevnar- will get pneumovax 23 in a year She will get Tdap at pharmacy or health dept due to price  Pt will schedule own mammogram for August She declines bone density test

## 2015-02-28 NOTE — Progress Notes (Signed)
Subjective:    Patient ID: Cindy [name], female    DOB: 03-14-1946, 69 y.o.   MRN: [id number]  HPI Here for annual medicare wellness visit as well as chronic/acute medical problems  As well as annual preventative exam  I have personally reviewed the Medicare Annual Wellness questionnaire and have noted 1. The patient's medical and social history 2. Their use of alcohol, tobacco or illicit drugs 3. Their current medications and supplements 4. The patient's functional ability including ADL's, fall risks, home safety risks and hearing or visual             impairment. 5. Diet and physical activities 6. Evidence for depression or mood disorders  The patients weight, height, BMI have been recorded in the chart and visual acuity is per eye clinic.  I have made referrals, counseling and provided education to the patient based review of the above and I have provided the pt with a written personalized care plan for preventive services. Reviewed and updated provider list, see scanned forms.  Overall a pretty good year  Daughter got married - that was really nice  Feeling fine     See scanned forms.  Routine anticipatory guidance given to patient.  See health maintenance. 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 No symptoms either  Flu vaccine  10/15  Tetanus vaccine 1/06 - does not think she has had one since then, [facility] T[facility]  Pneumovax 5/16 - will be due for pneumovax  23 in the future  Zoster vaccine 9/14  dexa- not interested , no fractures , no recent falls - takes ca and D  Advance directive- has living will and POA  Cognitive function addressed- see scanned forms- and if abnormal then additional documentation follows. - no concerns about memory   Is  exercising - taking care of herself  bmi 35    PMH and SH reviewed  Meds, vitals, and allergies reviewed.   ROS: See HPI.  Otherwise negative.     bp is stable today (up a bit on first check) No cp or palpitations or headaches or edema  No side effects to medicines  BP Readings from Last 3 Encounters:  02/28/15 142/78  02/07/14 128/80  02/03/13 130/72     bp was ok at her derm visit  Had 3 shave bx  (2 had mod dysplasia) - will have follow up    Hx of hyperglycemia Due for labs Lab Results  Component Value Date   HGBA1C 6.2 02/07/2014    Hx of anemia in the past Lab Results  Component Value Date   WBC 7.2 03/15/2014   HGB 12.0 03/15/2014   HCT 35.9* 03/15/2014   MCV 95.5 03/15/2014   PLT 301.0 03/15/2014     Patient Active Problem List   Diagnosis Date Noted  . Routine general medical examination at a health care facility 02/28/2015  . Other malaise and fatigue 02/21/2014  . Anemia 02/21/2014  . Encounter for Medicare annual wellness exam 02/03/2013  . Osteopenia 02/03/2012  . Hyperglycemia 10/10/2010  . COLONIC POLYPS 03/05/2010  . Hyperlipidemia 07/27/2009  . Essential hypertension 07/27/2009  . BASAL CELL CARCINOMA, HX OF 07/27/2009   Past Medical History  Diagnosis Date  .  Personal history of other malignant neoplasm of skin   . Benign neoplasm of colon   . Other abnormal glucose   . Other and unspecified hyperlipidemia   . Unspecified essential hypertension   . MVP (mitral valve prolapse)     very mild   Past Surgical History  Procedure Laterality Date  . Mohs surgery  11/09    for basal cell lesion  . Cardiac catheterization  1995    normal (after abnormal stress test)  . Colonoscopy  07/01/03    polyps   History  Substance Use Topics  . Smoking status: Never Smoker   . Smokeless tobacco: Not on file  . Alcohol Use: 0.0 oz/week    0 Standard drinks or equivalent per week     Comment: Regular   Family History  Problem Relation Age  of Onset  . Macular degeneration Mother   . Sudden death Father 49  . Hyperlipidemia Brother   . Coronary artery disease Brother     CABG  . Hypertension Brother    Allergies  Allergen Reactions  . Ampicillin     REACTION: rash  . Calcium     REACTION: GI intolerance  . Cephalexin     REACTION: rash   No current outpatient prescriptions on file prior to visit.   No current facility-administered medications on file prior to visit.    Review of Systems Review of Systems  Constitutional: Negative for fever, appetite change, fatigue and unexpected weight change.  Eyes: Negative for pain and visual disturbance.  Respiratory: Negative for cough and shortness of breath.   Cardiovascular: Negative for cp or palpitations    Gastrointestinal: Negative for nausea, diarrhea and constipation.  Genitourinary: Negative for urgency and frequency.  Skin: Negative for pallor or rash   Neurological: Negative for weakness, light-headedness, numbness and headaches.  Hematological: Negative for adenopathy. Does not bruise/bleed easily.  Psychiatric/Behavioral: Negative for dysphoric mood. The patient is not nervous/anxious.         Objective:   Physical Exam  Constitutional: She appears well-developed and well-nourished. No distress.  HENT:  Head: Normocephalic and atraumatic.  Right Ear: External ear normal.  Left Ear: External ear normal.  Mouth/Throat: Oropharynx is clear and moist.  Eyes: Conjunctivae and EOM are normal. Pupils are equal, round, and reactive to light. No scleral icterus.  Neck: Normal range of motion. Neck supple. No JVD present. Carotid bruit is not present. No thyromegaly present.  Cardiovascular: Normal rate, regular rhythm, normal heart sounds and intact distal pulses.  Exam reveals no gallop.   Pulmonary/Chest: Effort normal and breath sounds normal. No respiratory distress. She has no wheezes. She exhibits no tenderness.  Abdominal: Soft. Bowel sounds are normal.  She exhibits no distension, no abdominal bruit and no mass. There is no tenderness.  Genitourinary: No breast swelling, tenderness, discharge or bleeding.  Breast exam: No mass, nodules, thickening, tenderness, bulging, retraction, inflamation, nipple discharge or skin changes noted.  No axillary or clavicular LA.      Musculoskeletal: Normal range of motion. She exhibits no edema or tenderness.  Lymphadenopathy:    She has no cervical adenopathy.  Neurological: She is alert. She has normal reflexes. No cranial nerve deficit. She exhibits normal muscle tone. Coordination normal.  Skin: Skin is warm and dry. No rash noted. No erythema. No pallor.  Psychiatric: She has a normal mood and affect.          Assessment & Plan:   Problem List Items Addressed This  Visit    Encounter for Medicare annual wellness exam    Reviewed health habits including diet and exercise and skin cancer prevention Reviewed appropriate screening tests for age  Also reviewed health mt list, fam hx and immunization status , as well as social and family history   See HPI Labs today for wellness and chronic health problems  Had prevnar- will get pneumovax 23 in a year She will get Tdap at pharmacy or health dept due to price  Pt will schedule own mammogram for August She declines bone density test       Essential hypertension - Primary    bp in fair control at this time  BP Readings from Last 1 Encounters:  02/28/15 130/80   No changes needed Disc lifstyle change with low sodium diet and exercise  Lab today  Enc her to keep exercising       Relevant Medications   lisinopril (PRINIVIL,ZESTRIL) 20 MG tablet   atenolol (TENORMIN) 25 MG tablet   atorvastatin (LIPITOR) 20 MG tablet   Other Relevant Orders   CBC with Differential/Platelet (Completed)   Comprehensive metabolic panel (Completed)   TSH (Completed)   Hyperglycemia    A1C today  Lab Results  Component Value Date   HGBA1C 6.2 02/07/2014    Was stable Rev wt mt and low glycemic diet and exercise       Relevant Orders   Hemoglobin A1c (Completed)   Hyperlipidemia    Rev low sat fat diet  Continue lipitor Lab today       Relevant Medications   lisinopril (PRINIVIL,ZESTRIL) 20 MG tablet   atenolol (TENORMIN) 25 MG tablet   atorvastatin (LIPITOR) 20 MG tablet   Other Relevant Orders   Lipid panel (Completed)   Osteopenia    Pt declines dexa Disc need for calcium/ vitamin D/ wt bearing exercise and bone density test every 2 y to monitor Disc safety/ fracture risk in detail    No falls or fx        Routine general medical examination at a health care facility    Reviewed health habits including diet and exercise and skin cancer prevention Reviewed appropriate screening tests for age  Also reviewed health mt list, fam hx and immunization status , as well as social and family history   See HPI Labs today for wellness and chronic health problems  Had prevnar- will get pneumovax 23 in a year She will get Tdap at pharmacy or health dept due to price  Pt will schedule own mammogram for August She declines bone density test        Relevant Orders   CBC with Differential/Platelet (Completed)   Comprehensive metabolic panel (Completed)   TSH (Completed)   Lipid panel (Completed)

## 2015-02-28 NOTE — Assessment & Plan Note (Signed)
bp in fair control at this time  BP Readings from Last 1 Encounters:  02/28/15 130/80   No changes needed Disc lifstyle change with low sodium diet and exercise  Lab today  Enc her to keep exercising

## 2015-02-28 NOTE — Patient Instructions (Signed)
BP was better on 2nd check  Keep exercising  Labs today  Don't forget to schedule mammogram for august  Try to get 1200-1500 mg of calcium per day with at least 1000 iu of vitamin D - for bone health

## 2015-02-28 NOTE — Assessment & Plan Note (Signed)
Rev low sat fat diet  Continue lipitor Lab today

## 2015-02-28 NOTE — Assessment & Plan Note (Signed)
A1C today  Lab Results  Component Value Date   HGBA1C 6.2 02/07/2014   Was stable Rev wt mt and low glycemic diet and exercise

## 2015-02-28 NOTE — Progress Notes (Signed)
Pre visit review using our clinic review tool, if applicable. No additional management support is needed unless otherwise documented below in the visit note. 

## 2015-02-28 NOTE — Assessment & Plan Note (Signed)
Pt declines dexa Disc need for calcium/ vitamin D/ wt bearing exercise and bone density test every 2 y to monitor Disc safety/ fracture risk in detail    No falls or fx

## 2015-03-01 ENCOUNTER — Encounter: Payer: Self-pay | Admitting: Internal Medicine

## 2015-05-29 ENCOUNTER — Other Ambulatory Visit: Payer: Self-pay

## 2015-05-29 DIAGNOSIS — Z1231 Encounter for screening mammogram for malignant neoplasm of breast: Secondary | ICD-10-CM

## 2015-06-08 DIAGNOSIS — Z23 Encounter for immunization: Secondary | ICD-10-CM | POA: Diagnosis not present

## 2015-06-29 ENCOUNTER — Ambulatory Visit
Admission: RE | Admit: 2015-06-29 | Discharge: 2015-06-29 | Disposition: A | Discharge status: Home / Self Care | Payer: Medicare Other | Source: Ambulatory Visit

## 2015-06-29 DIAGNOSIS — Z1231 Encounter for screening mammogram for malignant neoplasm of breast: Secondary | ICD-10-CM | POA: Diagnosis not present

## 2015-08-29 ENCOUNTER — Encounter: Payer: Self-pay | Admitting: Family Medicine

## 2015-09-26 IMAGING — MG MM SCREENING BREAST TOMO BILATERAL
8 series · 9 of 24 positions shown · non-contrast
Comparison: Previous exam(s).

CLINICAL DATA: Screening.

EXAM:
DIGITAL SCREENING BILATERAL MAMMOGRAM WITH 3D TOMO WITH CAD

[L CC]
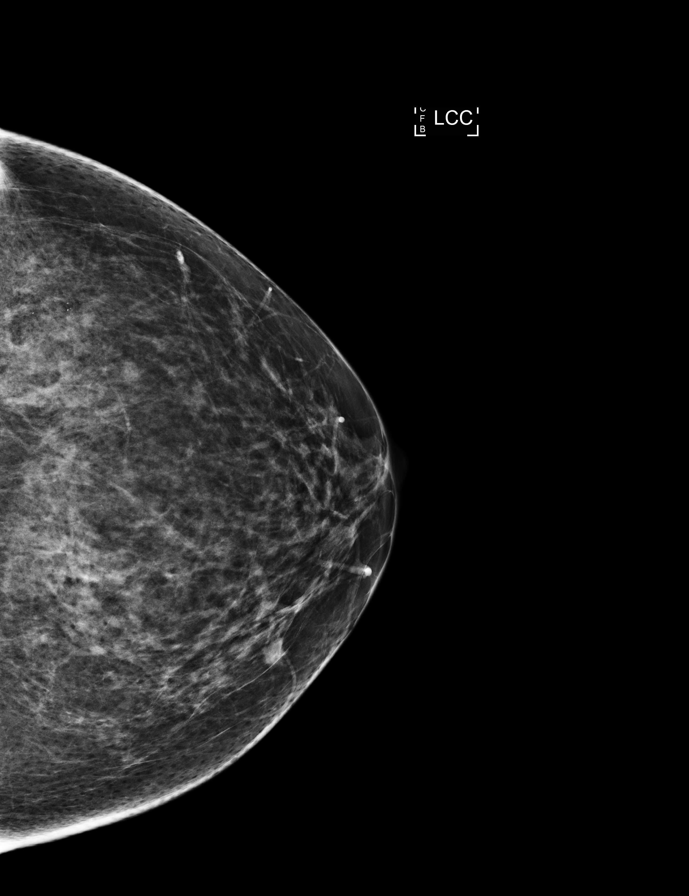

[R CC]
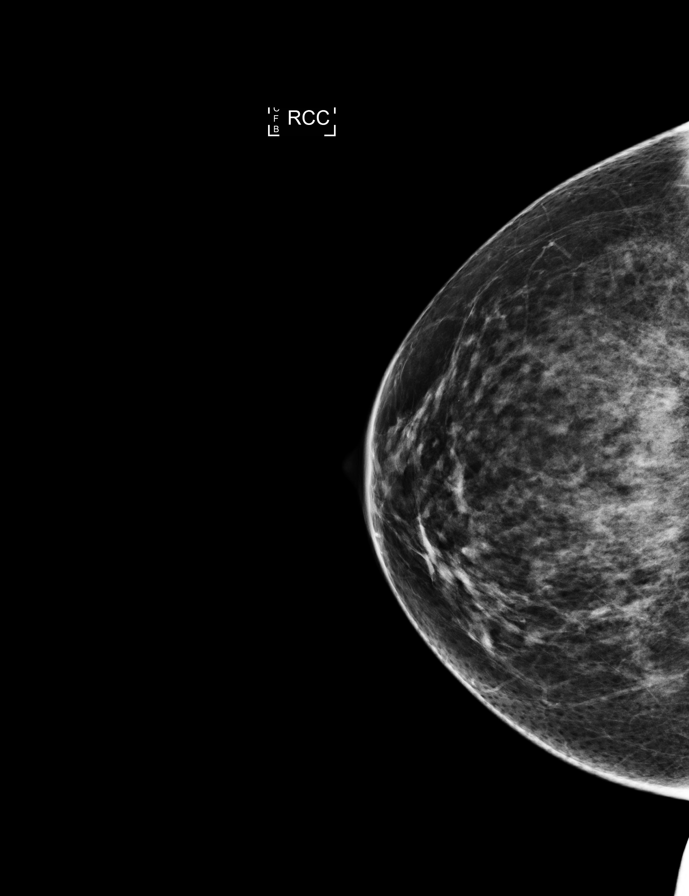

[R MLO]
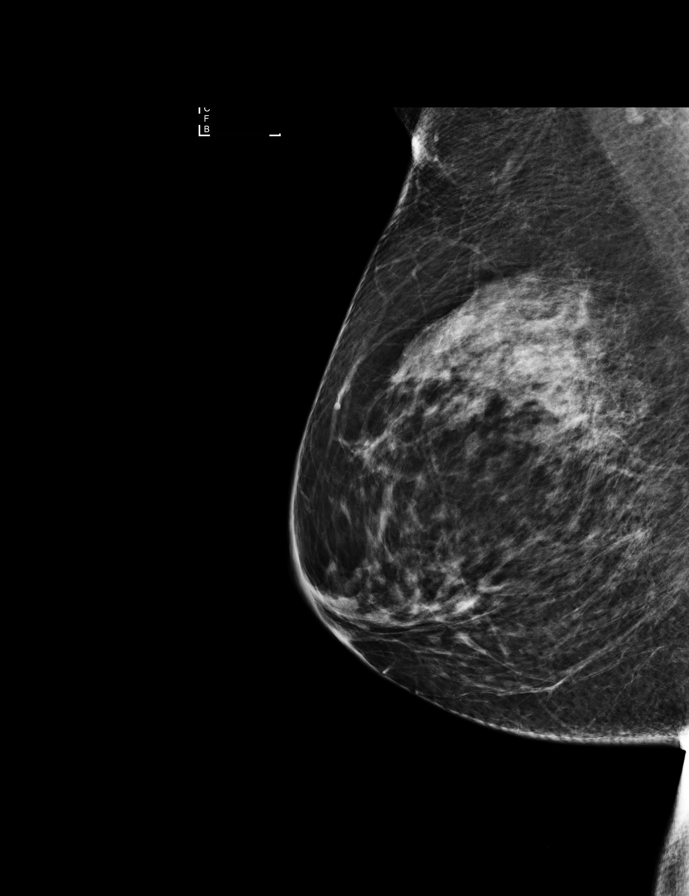

[L MLO]
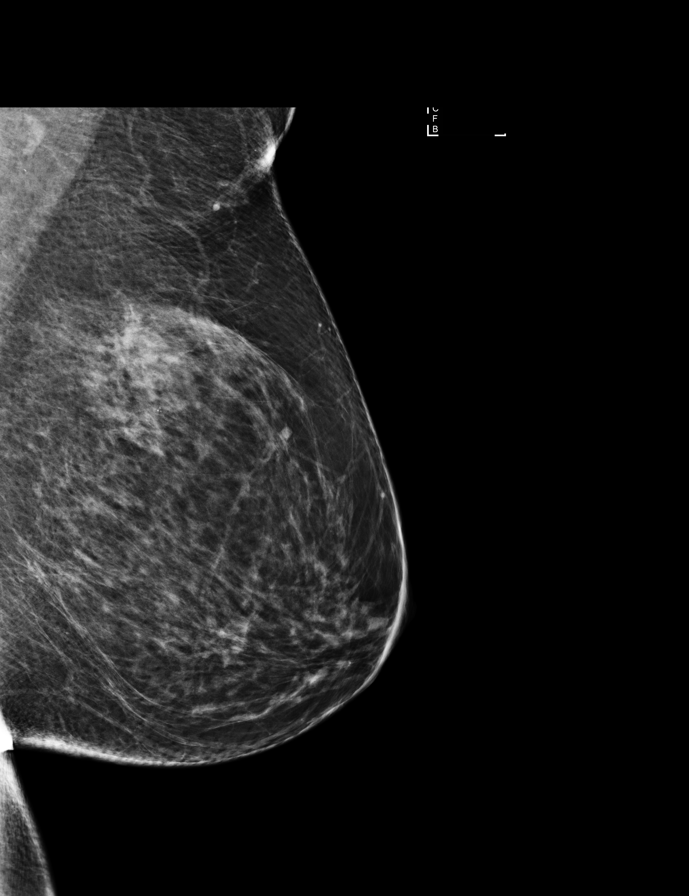

[R MLO tomo · 2 of 80 frames shown]
[frame 26/80]
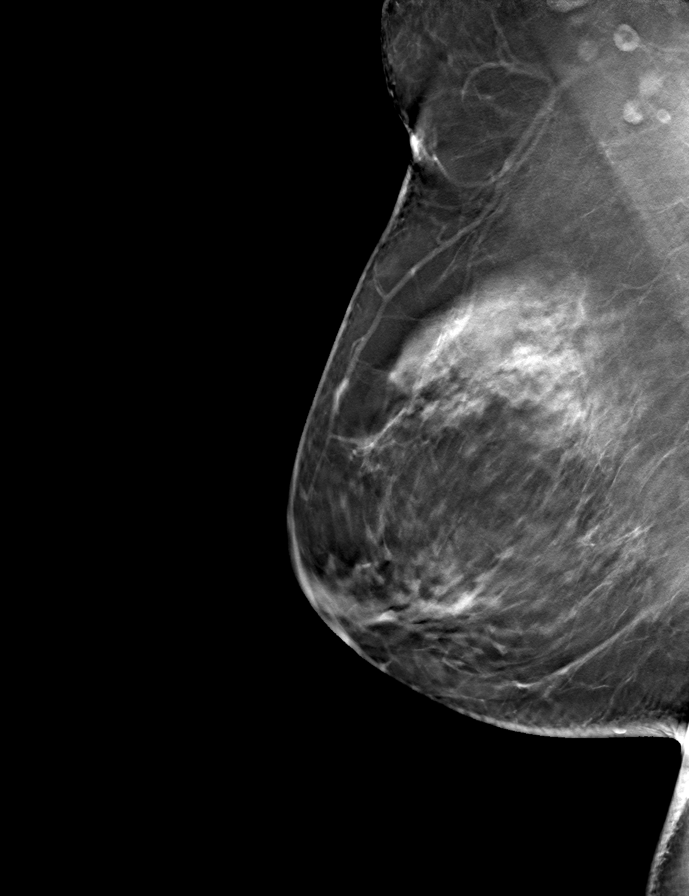
[frame 41/80]
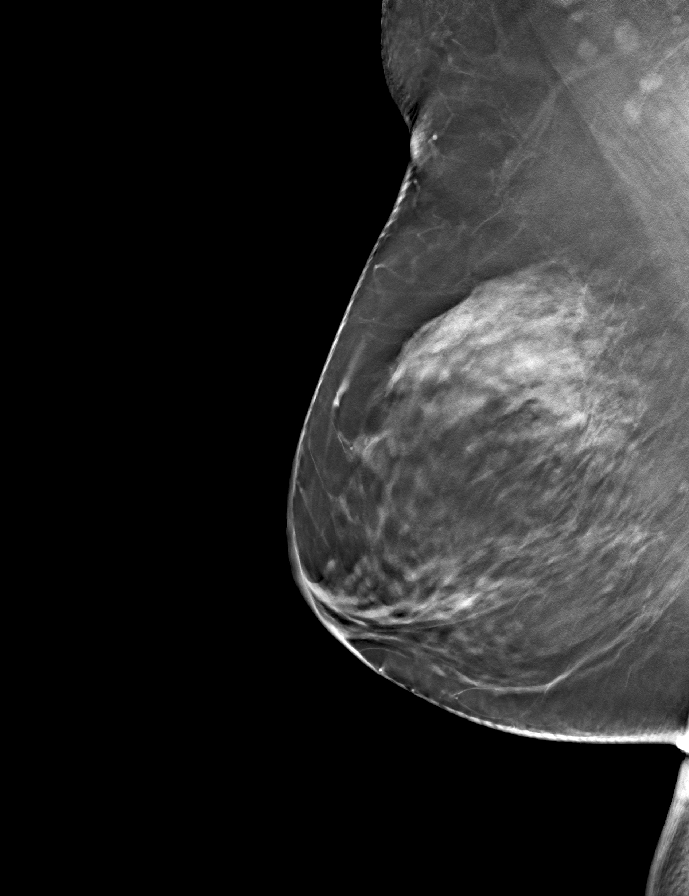

[L CC tomo · tomo slice 35/69.0]
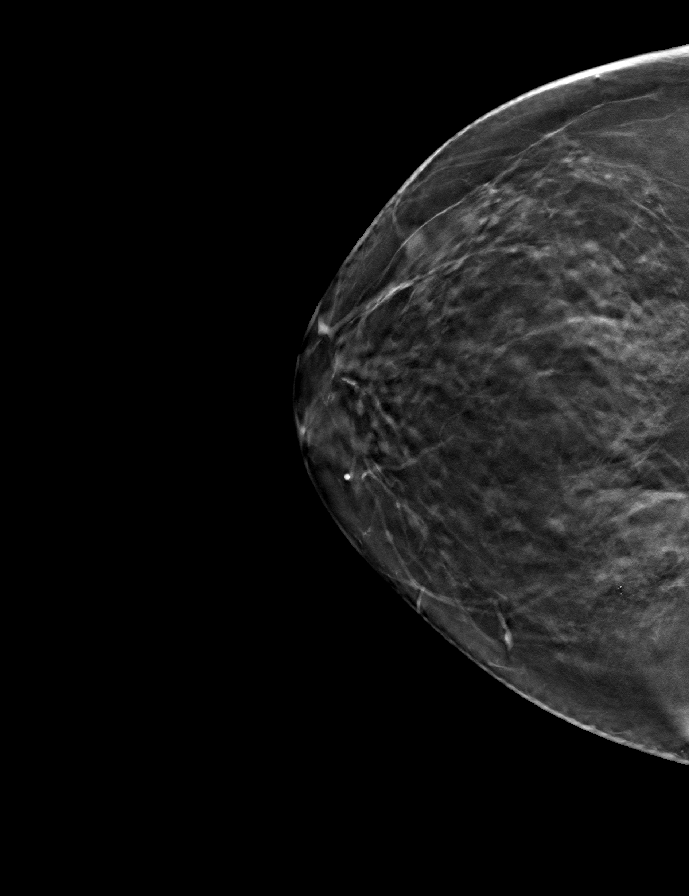

[L MLO tomo · tomo slice 43/85.0]
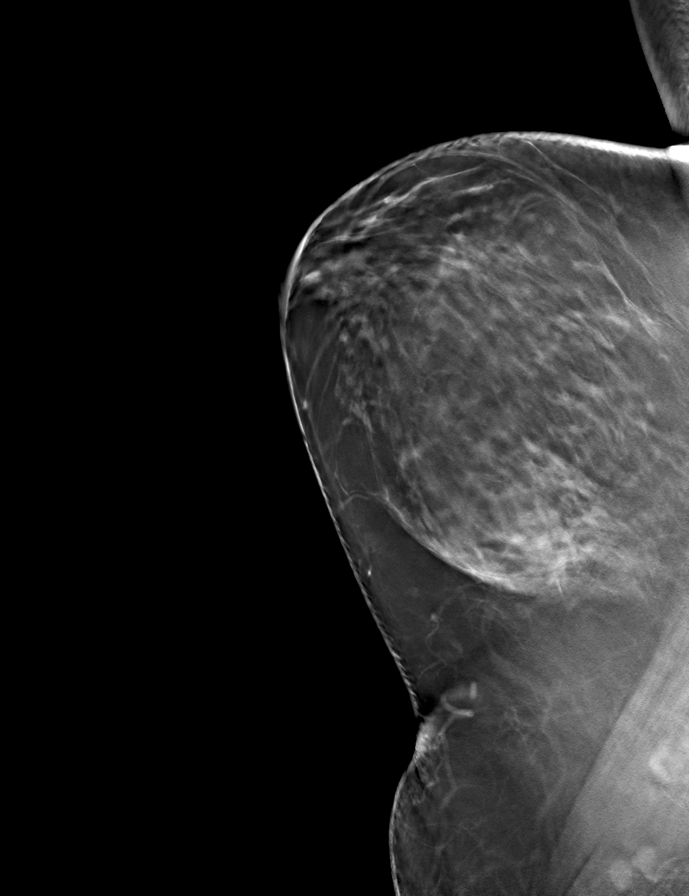

[R CC tomo · tomo slice 35/69.0]
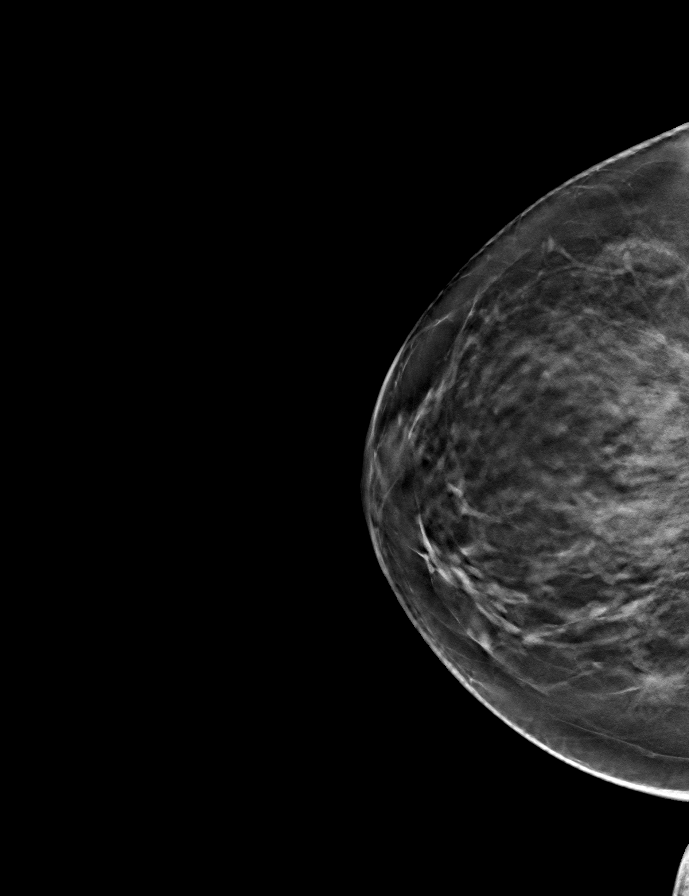

[9 of 24 positions shown; findings below may reference images not displayed]

ACR Breast Density Category b: There are scattered areas of
fibroglandular density.
FINDINGS: There has been no significant interval change and there are no
findings suspicious for malignancy. Images were processed with CAD.
IMPRESSION: No mammographic evidence of malignancy. A result letter of this
screening mammogram will be mailed directly to the patient.

RECOMMENDATION:
Screening mammogram in one year. (Code:TP-G-A98)

BI-RADS CATEGORY  1: Negative.

## 2015-10-18 ENCOUNTER — Encounter: Payer: Self-pay | Admitting: Family Medicine

## 2015-10-19 DIAGNOSIS — D1801 Hemangioma of skin and subcutaneous tissue: Secondary | ICD-10-CM | POA: Diagnosis not present

## 2015-10-19 DIAGNOSIS — L821 Other seborrheic keratosis: Secondary | ICD-10-CM | POA: Diagnosis not present

## 2015-10-19 DIAGNOSIS — L812 Freckles: Secondary | ICD-10-CM | POA: Diagnosis not present

## 2015-10-19 DIAGNOSIS — D225 Melanocytic nevi of trunk: Secondary | ICD-10-CM | POA: Diagnosis not present

## 2015-11-01 ENCOUNTER — Ambulatory Visit (INDEPENDENT_AMBULATORY_CARE_PROVIDER_SITE_OTHER)
Admission: RE | Admit: 2015-11-01 | Discharge: 2015-11-01 | Disposition: A | Discharge status: Home / Self Care | Payer: Medicare Other | Source: Ambulatory Visit | Attending: Family Medicine | Admitting: Family Medicine

## 2015-11-01 ENCOUNTER — Ambulatory Visit (INDEPENDENT_AMBULATORY_CARE_PROVIDER_SITE_OTHER): Payer: Medicare Other | Admitting: Family Medicine

## 2015-11-01 ENCOUNTER — Encounter: Payer: Self-pay | Admitting: Family Medicine

## 2015-11-01 VITALS — BP 154/80 | HR 75 | Temp 98.6°F | Ht 59.75 in | Wt 137.2 lb

## 2015-11-01 DIAGNOSIS — M7502 Adhesive capsulitis of left shoulder: Secondary | ICD-10-CM | POA: Diagnosis not present

## 2015-11-01 DIAGNOSIS — M25512 Pain in left shoulder: Secondary | ICD-10-CM

## 2015-11-01 NOTE — Progress Notes (Signed)
Pre visit review using our clinic review tool, if applicable. No additional management support is needed unless otherwise documented below in the visit note. 

## 2015-11-01 NOTE — Patient Instructions (Signed)

## 2015-11-01 NOTE — Progress Notes (Signed)
Dr. Frederico Hamman T. Roselle Norton, MD, [facility] [place], [address] Phone: [phone] Fax: [phone]  11/01/2015  Patient: Cindy [name], MRN: [id number], DOB: [birth year], 70 y.o.  Primary Physician:  Loura Pardon, MD   Chief Complaint  Patient presents with  . Shoulder Pain    Left   Subjective:   Cindy [name] is a 70 y.o. very pleasant female patient who presents with the following: shoulder pain  The patient noted above presents with shoulder pain that has been ongoing for 10 mo there is no history of trauma or accident. The patient denies neck pain or radicular symptoms. No shoulder blade pain Denies dislocation, subluxation, separation of the shoulder. The patient does complain of pain with flexion, abduction, and terminal motion.  Significant restriction of motion. she describes a deep ache around the shoulder, and sometimes it will wake the patient up at night.  Range of motion is not right Started last May - got a Prevnar shot IM last May  10 months ago.  Does not hurt all the time.  Dull ache sometimes.  No injury.   Medications Tried: tylenol Ice or Heat: minimal help Tried PT: No  Prior shoulder Injury: No Prior surgery: No Prior fracture: No  Past Medical History, Surgical History, Social History, Family History, Medications, and allergies reviewed and updated if relevant.   Patient Active Problem List   Diagnosis Date Noted  . Routine general medical examination at a health care facility 02/28/2015  . Other malaise and fatigue 02/21/2014  . Anemia 02/21/2014  . Encounter for Medicare annual wellness exam 02/03/2013  . Osteopenia 02/03/2012  . Hyperglycemia 10/10/2010  . COLONIC POLYPS 03/05/2010  . Hyperlipidemia 07/27/2009  . Essential hypertension 07/27/2009  . BASAL CELL CARCINOMA, HX OF 07/27/2009    Past Medical History  Diagnosis Date  . Personal history of other malignant  neoplasm of skin   . Benign neoplasm of colon   . Other abnormal glucose   . Other and unspecified hyperlipidemia   . Unspecified essential hypertension   . MVP (mitral valve prolapse)     very mild    Past Surgical History  Procedure Laterality Date  . Mohs surgery  11/09    for basal cell lesion  . Cardiac catheterization  1995    normal (after abnormal stress test)  . Colonoscopy  07/01/03    polyps    Social History   Social History  . Marital Status: Married    Spouse Name: N/A  . Number of Children: N/A  . Years of Education: N/A   Occupational History  . retired    Social History Main Topics  . Smoking status: Never Smoker   . Smokeless tobacco: Never Used  . Alcohol Use: 0.0 oz/week    0 Standard drinks or equivalent per week     Comment: Regular  . Drug Use: No  . Sexual Activity: Not on file   Other Topics Concern  . Not on file   Social History Narrative   Retired-med tech      Married-husband has advanced parkinson's      No regular exercise    Family History  Problem Relation Age of Onset  . Macular degeneration Mother   . Sudden death Father 61  . Hyperlipidemia Brother   . Coronary artery disease Brother     CABG  . Hypertension Brother     Allergies  Allergen Reactions  . Ampicillin  REACTION: rash  . Calcium     REACTION: GI intolerance  . Cephalexin     REACTION: rash    Medication list reviewed and updated in full in [place].  GEN: No fevers, chills. Nontoxic. Primarily MSK c/o today. MSK: Detailed in the HPI GI: tolerating PO intake without difficulty Neuro: No numbness, parasthesias, or tingling associated. Otherwise the pertinent positives of the ROS are noted above.    Objective:   Blood pressure 154/80, pulse 75, temperature 98.6 F (37 C), temperature source Oral, height 4' 11.75" (1.518 m), weight 137 lb 4 oz (62.256 kg).  GEN: Well-developed,well-nourished,in no acute distress; alert,appropriate  and cooperative throughout examination HEENT: Normocephalic and atraumatic without obvious abnormalities. Ears, externally no deformities PULM: Breathing comfortably in no respiratory distress EXT: No clubbing, cyanosis, or edema PSYCH: Normally interactive. Cooperative during the interview. Pleasant. Friendly and conversant. Not anxious or depressed appearing. Normal, full affect.  Shoulder: L Inspection: No muscle wasting or winging Ecchymosis/edema: neg  AC joint, scapula, clavicle: NT Cervical spine: NT, full ROM Spurling's: neg Abduction: 5/5, 90 Flexion: 5/5, 130 IR, full, lift-off: 5/5, none ER at neutral: 5/5, 5-10 deg at 90 ad AC crossover and compression: unable to complete Additional special testing is equivocal given lack of motion C5-T1 intact Sensation intact Grip 5/5  Assessment and Plan:   Adhesive capsulitis, left - Plan: Ambulatory referral to Physical Therapy  Left shoulder pain - Plan: DG Shoulder Left, Ambulatory referral to Physical Therapy  >25 minutes spent in face to face time with patient, >50% spent in counselling or coordination of care  Patient was given a systematic ROM protocol from [facility] or [facility] to be done daily. Emphasized importance of adherence, help of PT, daily HEP.  The average length of total symptoms is 12-18 months going through 3 different phases in the freezing and thawing process. Reviewed all with patient.   Tylenol or NSAID of choice prn for pain relief Intraarticular shoulder injections discussed with patient, which have good evidence for accelerating the thawing phase.  Patient will be sent for formal PT for aggressive frozen shoulder ROM. Will need RTC str and scapular stabilization to fix underlying mechanics.  Follow-up: if needed  Orders Placed This Encounter  Procedures  . DG Shoulder Left  . Ambulatory referral to Physical Therapy    Signed,  Frederico Hamman T. Solana Coggin, MD   Patient's Medications  New Prescriptions     No medications on file  Previous Medications   ATENOLOL (TENORMIN) 25 MG TABLET    Take 1 tablet (25 mg total) by mouth daily.   ATORVASTATIN (LIPITOR) 20 MG TABLET    Take 1 tablet (20 mg total) by mouth daily.   LISINOPRIL (PRINIVIL,ZESTRIL) 20 MG TABLET    Take 1 tablet (20 mg total) by mouth daily.   MULTIPLE VITAMINS-MINERALS (CENTRUM PO)    Take 1 tablet by mouth daily.  Modified Medications   No medications on file  Discontinued Medications   No medications on file

## 2015-11-13 DIAGNOSIS — M7502 Adhesive capsulitis of left shoulder: Secondary | ICD-10-CM | POA: Diagnosis not present

## 2015-11-16 DIAGNOSIS — M7502 Adhesive capsulitis of left shoulder: Secondary | ICD-10-CM | POA: Diagnosis not present

## 2015-11-21 DIAGNOSIS — M7502 Adhesive capsulitis of left shoulder: Secondary | ICD-10-CM | POA: Diagnosis not present

## 2015-11-24 DIAGNOSIS — M7502 Adhesive capsulitis of left shoulder: Secondary | ICD-10-CM | POA: Diagnosis not present

## 2015-11-27 DIAGNOSIS — M7502 Adhesive capsulitis of left shoulder: Secondary | ICD-10-CM | POA: Diagnosis not present

## 2015-11-30 DIAGNOSIS — M7502 Adhesive capsulitis of left shoulder: Secondary | ICD-10-CM | POA: Diagnosis not present

## 2015-12-04 DIAGNOSIS — M7502 Adhesive capsulitis of left shoulder: Secondary | ICD-10-CM | POA: Diagnosis not present

## 2015-12-07 DIAGNOSIS — M7502 Adhesive capsulitis of left shoulder: Secondary | ICD-10-CM | POA: Diagnosis not present

## 2015-12-19 DIAGNOSIS — M7502 Adhesive capsulitis of left shoulder: Secondary | ICD-10-CM | POA: Diagnosis not present

## 2015-12-21 DIAGNOSIS — M7502 Adhesive capsulitis of left shoulder: Secondary | ICD-10-CM | POA: Diagnosis not present

## 2015-12-25 DIAGNOSIS — M7502 Adhesive capsulitis of left shoulder: Secondary | ICD-10-CM | POA: Diagnosis not present

## 2015-12-28 DIAGNOSIS — M7502 Adhesive capsulitis of left shoulder: Secondary | ICD-10-CM | POA: Diagnosis not present

## 2016-01-01 ENCOUNTER — Encounter: Payer: Self-pay | Admitting: Internal Medicine

## 2016-02-06 DIAGNOSIS — L301 Dyshidrosis [pompholyx]: Secondary | ICD-10-CM | POA: Diagnosis not present

## 2016-03-04 ENCOUNTER — Encounter: Payer: Self-pay | Admitting: Family Medicine

## 2016-03-04 ENCOUNTER — Ambulatory Visit (INDEPENDENT_AMBULATORY_CARE_PROVIDER_SITE_OTHER): Payer: Medicare Other | Admitting: Family Medicine

## 2016-03-04 VITALS — BP 130/82 | HR 64 | Temp 98.2°F | Ht 59.75 in | Wt 134.8 lb

## 2016-03-04 DIAGNOSIS — M8580 Other specified disorders of bone density and structure, unspecified site: Secondary | ICD-10-CM | POA: Diagnosis not present

## 2016-03-04 DIAGNOSIS — R739 Hyperglycemia, unspecified: Secondary | ICD-10-CM

## 2016-03-04 DIAGNOSIS — I10 Essential (primary) hypertension: Secondary | ICD-10-CM

## 2016-03-04 DIAGNOSIS — E785 Hyperlipidemia, unspecified: Secondary | ICD-10-CM | POA: Diagnosis not present

## 2016-03-04 DIAGNOSIS — D126 Benign neoplasm of colon, unspecified: Secondary | ICD-10-CM

## 2016-03-04 LAB — COMPREHENSIVE METABOLIC PANEL
ALBUMIN: 4.5 g/dL (ref 3.5–5.2)
ALT: 28 U/L (ref 0–35)
AST: 32 U/L (ref 0–37)
Alkaline Phosphatase: 63 U/L (ref 39–117)
BILIRUBIN TOTAL: 0.6 mg/dL (ref 0.2–1.2)
BUN: 28 mg/dL — AB (ref 6–23)
CALCIUM: 10.2 mg/dL (ref 8.4–10.5)
CO2: 27 meq/L (ref 19–32)
CREATININE: 1.04 mg/dL (ref 0.40–1.20)
Chloride: 101 mEq/L (ref 96–112)
GFR: 55.59 mL/min — ABNORMAL LOW (ref >60.00)
Glucose, Bld: 112 mg/dL — ABNORMAL HIGH (ref 70–99)
Potassium: 4.9 mEq/L (ref 3.5–5.1)
SODIUM: 135 meq/L (ref 135–145)
Total Protein: 7.9 g/dL (ref 6.0–8.3)

## 2016-03-04 LAB — CBC WITH DIFFERENTIAL/PLATELET
BASOS ABS: 0 10*3/uL (ref 0.0–0.1)
Basophils Relative: 0.6 % (ref 0.0–3.0)
EOS ABS: 0.1 10*3/uL (ref 0.0–0.7)
Eosinophils Relative: 1.5 % (ref 0.0–5.0)
HEMATOCRIT: 37.6 % (ref 36.0–46.0)
HEMOGLOBIN: 12.6 g/dL (ref 12.0–15.0)
LYMPHS PCT: 31.1 % (ref 12.0–46.0)
Lymphs Abs: 2.3 10*3/uL (ref 0.7–4.0)
MCHC: 33.6 g/dL (ref 30.0–36.0)
MCV: 94.1 fl (ref 78.0–100.0)
Monocytes Absolute: 0.8 10*3/uL (ref 0.1–1.0)
Monocytes Relative: 10.6 % (ref 3.0–12.0)
Neutro Abs: 4.1 10*3/uL (ref 1.4–7.7)
Neutrophils Relative %: 56.2 % (ref 43.0–77.0)
Platelets: 311 10*3/uL (ref 150.0–400.0)
RBC: 4 Mil/uL (ref 3.87–5.11)
RDW: 14.3 % (ref 11.5–15.5)
WBC: 7.4 10*3/uL (ref 4.0–10.5)

## 2016-03-04 LAB — TSH: TSH: 2.33 u[IU]/mL (ref 0.35–4.50)

## 2016-03-04 LAB — LIPID PANEL
CHOLESTEROL: 192 mg/dL (ref 0–200)
HDL: 61.6 mg/dL (ref >39.00)
NonHDL: 130.14
TRIGLYCERIDES: 242 mg/dL — AB (ref 0.0–149.0)
Total CHOL/HDL Ratio: 3
VLDL: 48.4 mg/dL — ABNORMAL HIGH (ref 0.0–40.0)

## 2016-03-04 LAB — HEMOGLOBIN A1C: HEMOGLOBIN A1C: 6.1 % (ref 4.6–6.5)

## 2016-03-04 LAB — LDL CHOLESTEROL, DIRECT: Direct LDL: 102 mg/dL

## 2016-03-04 MED ORDER — LISINOPRIL 20 MG PO TABS: 20.0000 mg | ORAL_TABLET | Freq: Every day | ORAL | Status: DC

## 2016-03-04 MED ORDER — ATENOLOL 25 MG PO TABS: 25.0000 mg | ORAL_TABLET | Freq: Every day | ORAL | Status: DC

## 2016-03-04 MED ORDER — ATORVASTATIN CALCIUM 20 MG PO TABS: 20.0000 mg | ORAL_TABLET | Freq: Every day | ORAL | Status: DC

## 2016-03-04 NOTE — Patient Instructions (Addendum)
Labs today  Don't forget to schedule your colonoscopy  Get your pneumonia vaccine at a pharmacy (you are due for the pneumovax 23)= have them let us know when you get it  Get 2000 iu vitamin D3 over the counter and take it daily (for bone and overall health) Schedule your appt. For medicare interview when you check out

## 2016-03-04 NOTE — Assessment & Plan Note (Signed)
A1C today Does well with low glycemic diet but not as much exercise lately due to an injury

## 2016-03-04 NOTE — Assessment & Plan Note (Signed)
Pt still declines dexa because she would not take therapy regardless No falls or fx inst to start extra D 2000 iu daily  Also exercise

## 2016-03-04 NOTE — Assessment & Plan Note (Signed)
bp in fair control at this time  BP Readings from Last 1 Encounters:  03/04/16 130/82   No changes needed Disc lifstyle change with low sodium diet and exercise  Labs reviewed  Enc exercise when able

## 2016-03-04 NOTE — Assessment & Plan Note (Signed)
Due for her recall colonoscopy- she will schedule this herself from the reminder post card she rec No c/o

## 2016-03-04 NOTE — Progress Notes (Signed)
Subjective:    Patient ID: Cindy [name], female    DOB: Jul 31, 1946, 70 y.o.   MRN: [id number]  HPI Here for annual f/u of acute and chronic health problems   Feeling pretty good except for frozen shoulder-is a lot better than it was  Saw Dr Lorelei Pont and then physical therapy   Wt is down 3 lb with bmi of 26 Does not eat "terribly"  Has not exercised in about 3 mo due to shoulder  Looking forward to getting back -[facility] [employer]   Due for labs -will do today    Colon cancer screening ifob 6/15 neg Colonoscopy normal in 8/11 (prev polyps before that)- recall was 5-7 years per GI  (did get a reminder)- she plans to schedule herself   Mammogram 11/16 normal Self breast exam-no lumps   No gyn problems   Td 1/06- (pt states she had bad local reaction last time)- declines currently  Not around babies  Will get one if she gets a wound   PNA- due for PNA 23 (will get at the pharmacy)   dexa - declined last time and declines again (would not take tx if she needed it)  Hx of osteopenia  No falls  Fracture hx -none  Last D level was in the 30s - she takes mvi daily    Flu shot 10/15  Hep C screen done in 2000 -nl per pt   Zoster vaccine 9/14   bp is stable today  No cp or palpitations or headaches or edema  No side effects to medicines  BP Readings from Last 3 Encounters:  03/04/16 130/82  11/01/15 154/80  02/28/15 130/80     Hx of hyperglycemia  She avoids added sugar and does not eat a lot of fruit  Does not like sweets or sweetened drinks (one diet drink daily if needed)  Due for a check Lab Results  Component Value Date   HGBA1C 6.1 02/28/2015   Hx of hyperlipidemia Lab Results  Component Value Date   CHOL 193 02/28/2015   HDL 54.50 02/28/2015   LDLCALC 76 02/07/2014   LDLDIRECT 101.0 02/28/2015   TRIG 273.0* 02/28/2015   CHOLHDL 4 02/28/2015   takes atovastatin  20 mg Diet has been pretty good overall  occ eats bacon    Has not had AMW  visit with nurse yet   Patient Active Problem List   Diagnosis Date Noted  . Routine general medical examination at a health care facility 02/28/2015  . Other malaise and fatigue 02/21/2014  . Anemia 02/21/2014  . Encounter for Medicare annual wellness exam 02/03/2013  . Osteopenia 02/03/2012  . Hyperglycemia 10/10/2010  . COLONIC POLYPS 03/05/2010  . Hyperlipidemia 07/27/2009  . Essential hypertension 07/27/2009  . BASAL CELL CARCINOMA, HX OF 07/27/2009   Past Medical History  Diagnosis Date  . Personal history of other malignant neoplasm of skin   . Benign neoplasm of colon   . Other abnormal glucose   . Other and unspecified hyperlipidemia   . Unspecified essential hypertension   . MVP (mitral valve prolapse)     very mild   Past Surgical History  Procedure Laterality Date  . Mohs surgery  11/09    for basal cell lesion  . Cardiac catheterization  1995    normal (after abnormal stress test)  . Colonoscopy  07/01/03    polyps   Social History  Substance Use Topics  . Smoking status: Never Smoker   .  Smokeless tobacco: Never Used  . Alcohol Use: 0.0 oz/week    0 Standard drinks or equivalent per week     Comment: Regular   Family History  Problem Relation Age of Onset  . Macular degeneration Mother   . Sudden death Father 84  . Hyperlipidemia Brother   . Coronary artery disease Brother     CABG  . Hypertension Brother    Allergies  Allergen Reactions  . Ampicillin     REACTION: rash  . Calcium     REACTION: GI intolerance  . Cephalexin     REACTION: rash   Current Outpatient Prescriptions on File Prior to Visit  Medication Sig Dispense Refill  . atenolol (TENORMIN) 25 MG tablet Take 1 tablet (25 mg total) by mouth daily. 90 tablet 3  . atorvastatin (LIPITOR) 20 MG tablet Take 1 tablet (20 mg total) by mouth daily. 90 tablet 3  . lisinopril (PRINIVIL,ZESTRIL) 20 MG tablet Take 1 tablet (20 mg total) by mouth daily. 90 tablet 3  . Multiple  Vitamins-Minerals (CENTRUM PO) Take 1 tablet by mouth daily.     No current facility-administered medications on file prior to visit.    Review of Systems Review of Systems  Constitutional: Negative for fever, appetite change, fatigue and unexpected weight change.  Eyes: Negative for pain and visual disturbance.  Respiratory: Negative for cough and shortness of breath.   Cardiovascular: Negative for cp or palpitations    Gastrointestinal: Negative for nausea, diarrhea and constipation.  Genitourinary: Negative for urgency and frequency.  Skin: Negative for pallor or rash   Neurological: Negative for weakness, light-headedness, numbness and headaches.  MSK pos for shoulder pain that is improving Hematological: Negative for adenopathy. Does not bruise/bleed easily.  Psychiatric/Behavioral: Negative for dysphoric mood. The patient is not nervous/anxious.         Objective:   Physical Exam  Constitutional: She appears well-developed and well-nourished. No distress.  overwt and well app  HENT:  Head: Normocephalic and atraumatic.  Right Ear: External ear normal.  Left Ear: External ear normal.  Mouth/Throat: Oropharynx is clear and moist.  Eyes: Conjunctivae and EOM are normal. Pupils are equal, round, and reactive to light. No scleral icterus.  Neck: Normal range of motion. Neck supple. No JVD present. Carotid bruit is not present. No thyromegaly present.  Cardiovascular: Normal rate, regular rhythm, normal heart sounds and intact distal pulses.  Exam reveals no gallop.   Pulmonary/Chest: Effort normal and breath sounds normal. No respiratory distress. She has no wheezes. She exhibits no tenderness.  Abdominal: Soft. Bowel sounds are normal. She exhibits no distension, no abdominal bruit and no mass. There is no tenderness.  Genitourinary: No breast swelling, tenderness, discharge or bleeding.  Breast exam: No mass, nodules, thickening, tenderness, bulging, retraction, inflamation,  nipple discharge or skin changes noted.  No axillary or clavicular LA.      Musculoskeletal: She exhibits no edema or tenderness.  Limited shoulder rom  No kyphosis   Lymphadenopathy:    She has no cervical adenopathy.  Neurological: She is alert. She has normal reflexes. No cranial nerve deficit. She exhibits normal muscle tone. Coordination normal.  Skin: Skin is warm and dry. No rash noted. No erythema. No pallor.  SKs and lentigines diffusely  Psychiatric: She has a normal mood and affect.          Assessment & Plan:   Problem List Items Addressed This Visit      Cardiovascular and Mediastinum   Essential  hypertension - Primary    bp in fair control at this time  BP Readings from Last 1 Encounters:  03/04/16 130/82   No changes needed Disc lifstyle change with low sodium diet and exercise  Labs reviewed  Enc exercise when able      Relevant Medications   lisinopril (PRINIVIL,ZESTRIL) 20 MG tablet   atorvastatin (LIPITOR) 20 MG tablet   atenolol (TENORMIN) 25 MG tablet   Other Relevant Orders   CBC with Differential/Platelet   Comprehensive metabolic panel   Lipid panel   TSH     Digestive   COLONIC POLYPS    Due for her recall colonoscopy- she will schedule this herself from the reminder post card she rec No c/o        Musculoskeletal and Integument   Osteopenia    Pt still declines dexa because she would not take therapy regardless No falls or fx inst to start extra D 2000 iu daily  Also exercise         Other   Hyperlipidemia    Lipid panel today  Good low fat diet and atorvastatin  Disc goals for lipids and reasons to control them Rev labs with pt (from last check) Rev low sat fat diet in detail       Relevant Medications   lisinopril (PRINIVIL,ZESTRIL) 20 MG tablet   atorvastatin (LIPITOR) 20 MG tablet   atenolol (TENORMIN) 25 MG tablet   Other Relevant Orders   Lipid panel   Hyperglycemia    A1C today Does well with low glycemic  diet but not as much exercise lately due to an injury      Relevant Orders   Hemoglobin A1c

## 2016-03-04 NOTE — Progress Notes (Signed)
Pre visit review using our clinic review tool, if applicable. No additional management support is needed unless otherwise documented below in the visit note. 

## 2016-03-04 NOTE — Assessment & Plan Note (Signed)
Lipid panel today  Good low fat diet and atorvastatin  Disc goals for lipids and reasons to control them Rev labs with pt (from last check) Rev low sat fat diet in detail

## 2016-03-07 ENCOUNTER — Ambulatory Visit (INDEPENDENT_AMBULATORY_CARE_PROVIDER_SITE_OTHER): Payer: Medicare Other

## 2016-03-07 VITALS — BP 142/76 | HR 59 | Temp 97.9°F | Ht 59.5 in | Wt 135.5 lb

## 2016-03-07 DIAGNOSIS — Z0000 Encounter for general adult medical examination without abnormal findings: Secondary | ICD-10-CM | POA: Diagnosis not present

## 2016-03-07 NOTE — Progress Notes (Signed)
PCP notes  Health maintenance:  PPSV23 - pt plans to get vaccine in near future  Abnormal screenings:  Hearing - failed  Patient concerns: None  Nurse concerns: None  Next PCP appt: 03/11/2017 @ 1030

## 2016-03-07 NOTE — Progress Notes (Signed)
Subjective:   Cindy [name] is a 70 y.o. female who presents for Medicare Annual (Subsequent) preventive examination.  Review of Systems:  N/A Cardiac Risk Factors include: advanced age (>29men, >46 women);dyslipidemia;hypertension     Objective:     Vitals: BP 142/76 mmHg  Pulse 59  Temp(Src) 97.9 F (36.6 C) (Oral)  Ht 4' 11.5" (1.511 m)  Wt 135 lb 8 oz (61.462 kg)  BMI 26.92 kg/m2  SpO2 98%  Body mass index is 26.92 kg/(m^2).   Tobacco History  Smoking status  . Never Smoker   Smokeless tobacco  . Never Used     Counseling given: No   Past Medical History  Diagnosis Date  . Personal history of other malignant neoplasm of skin   . Benign neoplasm of colon   . Other abnormal glucose   . Other and unspecified hyperlipidemia   . Unspecified essential hypertension   . MVP (mitral valve prolapse)     very mild   Past Surgical History  Procedure Laterality Date  . Mohs surgery  11/09    for basal cell lesion  . Cardiac catheterization  1995    normal (after abnormal stress test)  . Colonoscopy  07/01/03    polyps   Family History  Problem Relation Age of Onset  . Macular degeneration Mother   . Sudden death Father 22  . Hyperlipidemia Brother   . Coronary artery disease Brother     CABG  . Hypertension Brother    History  Sexual Activity  . Sexual Activity: No    Outpatient Encounter Prescriptions as of 03/07/2016  Medication Sig  . atenolol (TENORMIN) 25 MG tablet Take 1 tablet (25 mg total) by mouth daily.  Marland Kitchen atorvastatin (LIPITOR) 20 MG tablet Take 1 tablet (20 mg total) by mouth daily.  Marland Kitchen lisinopril (PRINIVIL,ZESTRIL) 20 MG tablet Take 1 tablet (20 mg total) by mouth daily.  . Multiple Vitamins-Minerals (CENTRUM PO) Take 1 tablet by mouth daily.  Marland Kitchen triamcinolone cream (KENALOG) 0.1 % Apply 1 application topically 2 (two) times daily as needed.   No facility-administered encounter medications on file as of 03/07/2016.    Activities of Daily  Living In your present state of health, do you have any difficulty performing the following activities: 03/07/2016  Hearing? N  Vision? N  Difficulty concentrating or making decisions? N  Walking or climbing stairs? N  Dressing or bathing? N  Doing errands, shopping? N  Preparing Food and eating ? N  Using the Toilet? N  In the past six months, have you accidently leaked urine? N  Do you have problems with loss of bowel control? N  Managing your Medications? N  Managing your Finances? N  Housekeeping or managing your Housekeeping? N    Patient Care Team: Abner Greenspan, MD as PCP - General Agapito Games as Referring Physician (Optometry) Druscilla Brownie, MD as Consulting Physician (Dermatology)    Assessment:     Hearing Screening   125Hz  250Hz  500Hz  1000Hz  2000Hz  4000Hz  8000Hz   Right ear:   0 0 40 40   Left ear:   40 0 40 40   Vision Screening Comments: Last vision exam with Dr. Marvel Plan on 11/08/2015   Exercise Activities and Dietary recommendations Current Exercise Habits: The patient does not participate in regular exercise at present, Exercise limited by: orthopedic condition(s)  Goals    . Increase physical activity     When tolerated, I will resume exercise for at least 30 min 2-3  days per week.       Fall Risk Fall Risk  03/07/2016 02/28/2015 02/07/2014 02/03/2013  Falls in the past year? No No No No   Depression Screen PHQ 2/9 Scores 03/07/2016 02/28/2015 02/07/2014 02/03/2013  PHQ - 2 Score 0 0 0 0     Cognitive Testing MMSE - Mini Mental State Exam 03/07/2016  Orientation to time 5  Orientation to Place 5  Registration 3  Attention/ Calculation 0  Recall 3  Language- name 2 objects 0  Language- repeat 1  Language- follow 3 step command 3  Language- read & follow direction 0  Write a sentence 0  Copy design 0  Total score 20   PLEASE NOTE: A Mini-Cog screen was completed. Maximum score is 20. A value of 0 denotes this part of Folstein MMSE was not  completed or the patient failed this part of the Mini-Cog screening.   Mini-Cog Screening Orientation to Time - Max 5 pts Orientation to Place - Max 5 pts Registration - Max 3 pts Recall - Max 3 pts Language Repeat - Max 1 pts Language Follow 3 Step Command - Max 3 pts  Immunization History  Administered Date(s) Administered  . Influenza Split 06/04/2011  . Influenza Whole 04/26/2009, 05/26/2009, 05/28/2010, 06/02/2012  . Influenza-Unspecified 06/01/2013, 05/27/2014  . Pneumococcal Conjugate-13 01/12/2015  . Td 08/26/2004  . Zoster 05/07/2013   Screening Tests Health Maintenance  Topic Date Due  . PNA vac Low Risk Adult (2 of 2 - PPSV23) 05/26/2016 (Originally 01/12/2016)  . DEXA SCAN  09/27/2020 (Originally 08/10/2010)  . TETANUS/TDAP  09/28/2023 (Originally 08/26/2014)  . INFLUENZA VACCINE  03/26/2016  . MAMMOGRAM  06/28/2017  . COLONOSCOPY  04/10/2020  . ZOSTAVAX  Completed  . Hepatitis C Screening  Completed      Plan:     I have personally reviewed and addressed the Medicare Annual Wellness questionnaire and have noted the following in the patient's chart:  A. Medical and social history B. Use of alcohol, tobacco or illicit drugs  C. Current medications and supplements D. Functional ability and status E.  Nutritional status F.  Physical activity G. Advance directives H. List of other physicians I.  Hospitalizations, surgeries, and ER visits in previous 12 months J.  [place] to include hearing, vision, cognitive, depression L. Referrals and appointments - none  In addition, I have reviewed and discussed with patient certain preventive protocols, quality metrics, and best practice recommendations. A written personalized care plan for preventive services as well as general preventive health recommendations were provided to patient.  See attached scanned questionnaire for additional information.   Signed,   Lindell Noe, MHA, BS, LPN Health  Advisor

## 2016-03-07 NOTE — Progress Notes (Signed)
Medical screening examination/treatment/procedure(s) were performed by registered nurse and as supervising non-physician practitioner, I was immediately available for consultation/collaboration.   BAITY, REGINA, NP  

## 2016-03-07 NOTE — Progress Notes (Signed)
Pre visit review using our clinic review tool, if applicable. No additional management support is needed unless otherwise documented below in the visit note. 

## 2016-03-07 NOTE — Patient Instructions (Signed)
Cindy [name] , Thank you for taking time to come for your Medicare Wellness Visit. I appreciate your ongoing commitment to your health goals. Please review the following plan we discussed and let me know if I can assist you in the future.   These are the goals we discussed: Goals    . Increase physical activity     When tolerated, I will resume exercise for at least 30 min 2-3 days per week.        This is a list of the screening recommended for you and due dates:  Health Maintenance  Topic Date Due  . Pneumonia vaccines (2 of 2 - PPSV23) 05/26/2016*  . DEXA scan (bone density measurement)  09/27/2020*  . Tetanus Vaccine  09/28/2023*  . Flu Shot  03/26/2016  . Mammogram  06/28/2017  . Colon Cancer Screening  04/10/2020  . Shingles Vaccine  Completed  .  Hepatitis C: One time screening is recommended by Center for Disease Control  (CDC) for  adults born from [birth year] through 1966.   Completed  *Topic was postponed. The date shown is not the original due date.   Preventive Care for Adults  A healthy lifestyle and preventive care can promote health and wellness. Preventive health guidelines for adults include the following key practices.  . A routine yearly physical is a good way to check with your health care provider about your health and preventive screening. It is a chance to share any concerns and updates on your health and to receive a thorough exam.  . Visit your dentist for a routine exam and preventive care every 6 months. Brush your teeth twice a day and floss once a day. Good oral hygiene prevents tooth decay and gum disease.  . The frequency of eye exams is based on your age, health, family medical history, use  of contact lenses, and other factors. Follow your health care provider's ecommendations for frequency of eye exams.  . Eat a healthy diet. Foods like vegetables, fruits, whole grains, low-fat dairy products, and lean protein foods contain the nutrients you need without  too many calories. Decrease your intake of foods high in solid fats, added sugars, and salt. Eat the right amount of calories for you. Get information about a proper diet from your health care provider, if necessary.  . Regular physical exercise is one of the most important things you can do for your health. Most adults should get at least 150 minutes of moderate-intensity exercise (any activity that increases your heart rate and causes you to sweat) each week. In addition, most adults need muscle-strengthening exercises on 2 or more days a week.  Silver Sneakers may be a benefit available to you. To determine eligibility, you may visit the website: www.silversneakers.com or contact program at 865-347-9234 Mon-Fri between 8AM-8PM.   . Maintain a healthy weight. The body mass index (BMI) is a screening tool to identify possible weight problems. It provides an estimate of body fat based on height and weight. Your health care provider can find your BMI and can help you achieve or maintain a healthy weight.   For adults 20 years and older: ? A BMI below 18.5 is considered underweight. ? A BMI of 18.5 to 24.9 is normal. ? A BMI of 25 to 29.9 is considered overweight. ? A BMI of 30 and above is considered obese.   . Maintain normal blood lipids and cholesterol levels by exercising and minimizing your intake of saturated fat. Eat a balanced  diet with plenty of fruit and vegetables. Blood tests for lipids and cholesterol should begin at age 41 and be repeated every 5 years. If your lipid or cholesterol levels are high, you are over 50, or you are at high risk for heart disease, you may need your cholesterol levels checked more frequently. Ongoing high lipid and cholesterol levels should be treated with medicines if diet and exercise are not working.  . If you smoke, find out from your health care provider how to quit. If you do not use tobacco, please do not start.  . If you choose to drink alcohol,  please do not consume more than 2 drinks per day. One drink is considered to be 12 ounces (355 mL) of beer, 5 ounces (148 mL) of wine, or 1.5 ounces (44 mL) of liquor.  . If you are 6-66 years old, ask your health care provider if you should take aspirin to prevent strokes.  . Use sunscreen. Apply sunscreen liberally and repeatedly throughout the day. You should seek shade when your shadow is shorter than you. Protect yourself by wearing long sleeves, pants, a wide-brimmed hat, and sunglasses year round, whenever you are outdoors.  . Once a month, do a whole body skin exam, using a mirror to look at the skin on your back. Tell your health care provider of new moles, moles that have irregular borders, moles that are larger than a pencil eraser, or moles that have changed in shape or color.

## 2016-03-12 ENCOUNTER — Ambulatory Visit: Payer: Medicare Other

## 2016-06-19 DIAGNOSIS — Z23 Encounter for immunization: Secondary | ICD-10-CM | POA: Diagnosis not present

## 2016-08-13 ENCOUNTER — Other Ambulatory Visit: Payer: Self-pay | Admitting: Family Medicine

## 2016-08-13 DIAGNOSIS — Z1231 Encounter for screening mammogram for malignant neoplasm of breast: Secondary | ICD-10-CM

## 2016-09-17 ENCOUNTER — Ambulatory Visit
Admission: RE | Admit: 2016-09-17 | Discharge: 2016-09-17 | Disposition: A | Discharge status: Home / Self Care | Payer: Medicare Other | Source: Ambulatory Visit | Attending: Family Medicine | Admitting: Family Medicine

## 2016-09-17 DIAGNOSIS — Z1231 Encounter for screening mammogram for malignant neoplasm of breast: Secondary | ICD-10-CM

## 2016-11-07 DIAGNOSIS — D225 Melanocytic nevi of trunk: Secondary | ICD-10-CM | POA: Diagnosis not present

## 2016-11-07 DIAGNOSIS — L814 Other melanin hyperpigmentation: Secondary | ICD-10-CM | POA: Diagnosis not present

## 2016-11-07 DIAGNOSIS — L821 Other seborrheic keratosis: Secondary | ICD-10-CM | POA: Diagnosis not present

## 2016-11-07 DIAGNOSIS — D1801 Hemangioma of skin and subcutaneous tissue: Secondary | ICD-10-CM | POA: Diagnosis not present

## 2016-11-07 DIAGNOSIS — L570 Actinic keratosis: Secondary | ICD-10-CM | POA: Diagnosis not present

## 2016-11-07 DIAGNOSIS — L918 Other hypertrophic disorders of the skin: Secondary | ICD-10-CM | POA: Diagnosis not present

## 2017-02-27 NOTE — Progress Notes (Signed)
Subjective:   Cindy [name] is a 71 y.o. female who presents for Medicare Annual (Subsequent) preventive examination.  Review of Systems:  No ROS.  Medicare Wellness Visit. Additional risk factors are reflected in the social history.  Cardiac Risk Factors include: advanced age (>55men, >26 women);dyslipidemia;hypertension     Objective:     Vitals: BP 134/78   Pulse (!) 58   Ht 4' 11.5" (1.511 m)   Wt 136 lb (61.7 kg)   SpO2 98%   BMI 27.01 kg/m   Body mass index is 27.01 kg/m.   Tobacco History  Smoking Status  . Never Smoker  Smokeless Tobacco  . Never Used     Counseling given: Not Answered   Past Medical History:  Diagnosis Date  . Benign neoplasm of colon   . MVP (mitral valve prolapse)    very mild  . Other abnormal glucose   . Other and unspecified hyperlipidemia   . Personal history of other malignant neoplasm of skin   . Unspecified essential hypertension    Past Surgical History:  Procedure Laterality Date  . CARDIAC CATHETERIZATION  1995   normal (after abnormal stress test)  . COLONOSCOPY  07/01/03   polyps  . MOHS SURGERY  11/09   for basal cell lesion   Family History  Problem Relation Age of Onset  . Macular degeneration Mother   . Sudden death Father 89  . Hyperlipidemia Brother   . Coronary artery disease Brother        CABG  . Hypertension Brother    History  Sexual Activity  . Sexual activity: No    Outpatient Encounter Prescriptions as of 03/11/2017  Medication Sig  . atenolol (TENORMIN) 25 MG tablet Take 1 tablet (25 mg total) by mouth daily.  Marland Kitchen atorvastatin (LIPITOR) 20 MG tablet Take 1 tablet (20 mg total) by mouth daily.  . Cholecalciferol (VITAMIN D-3) 1000 units CAPS Take 1 capsule by mouth daily.  Marland Kitchen lisinopril (PRINIVIL,ZESTRIL) 20 MG tablet Take 1 tablet (20 mg total) by mouth daily.  . Multiple Vitamins-Minerals (CENTRUM PO) Take 1 tablet by mouth daily.  Marland Kitchen triamcinolone cream (KENALOG) 0.1 % Apply 1 application  topically 2 (two) times daily as needed.   No facility-administered encounter medications on file as of 03/11/2017.     Activities of Daily Living In your present state of health, do you have any difficulty performing the following activities: 03/11/2017  Hearing? N  Vision? N  Difficulty concentrating or making decisions? N  Walking or climbing stairs? N  Dressing or bathing? N  Doing errands, shopping? N  Preparing Food and eating ? N  Using the Toilet? N  In the past six months, have you accidently leaked urine? N  Do you have problems with loss of bowel control? N  Managing your Medications? N  Managing your Finances? N  Housekeeping or managing your Housekeeping? N  Some recent data might be hidden    Patient Care Team: Tower, Wynelle Fanny, MD as PCP - General Agapito Games as Referring Physician (Optometry) Druscilla Brownie, MD as Consulting Physician (Dermatology)    Assessment:    Physical assessment deferred to PCP.  Exercise Activities and Dietary recommendations Current Exercise Habits: Home exercise routine, Type of exercise: treadmill;strength training/weights, Time (Minutes): 45, Frequency (Times/Week): 3, Weekly Exercise (Minutes/Week): 135, Exercise limited by: orthopedic condition(s) (R hip pain)  Goals    . Increase physical activity (pt-stated)          Starting 03/11/2017,  I will resume exercise as tolerated for at least 30 min 2-3 days per week.       Fall Risk Fall Risk  03/11/2017 03/07/2016 02/28/2015 02/07/2014 02/03/2013  Falls in the past year? No No No No No   Depression Screen PHQ 2/9 Scores 03/11/2017 03/07/2016 02/28/2015 02/07/2014  PHQ - 2 Score 0 0 0 0     Cognitive Function PLEASE NOTE: A Mini-Cog screen was completed. Maximum score is 20. A value of 0 denotes this part of Folstein MMSE was not completed or the patient failed this part of the Mini-Cog screening.   Mini-Cog Screening Orientation to Time - Max 5 pts Orientation to Place -  Max 5 pts Registration - Max 3 pts Recall - Max 3 pts Language Repeat - Max 1 pts Language Follow 3 Step Command - Max 3 pts      Mini-Cog - 03/11/17 0947    Normal clock drawing test? yes   How many words correct? 3      MMSE - Mini Mental State Exam 03/11/2017 03/07/2016  Orientation to time 5 5  Orientation to Place 5 5  Registration 3 3  Attention/ Calculation 0 0  Recall 3 3  Language- name 2 objects 0 0  Language- repeat 1 1  Language- follow 3 step command 3 3  Language- read & follow direction 0 0  Write a sentence 0 0  Copy design 0 0  Total score 20 20        Immunization History  Administered Date(s) Administered  . Influenza Split 06/04/2011  . Influenza Whole 04/26/2009, 05/26/2009, 05/28/2010, 06/02/2012  . Influenza-Unspecified 06/01/2013, 05/27/2014  . Pneumococcal Conjugate-13 01/12/2015  . Td 08/26/2004  . Zoster 05/07/2013   Screening Tests Health Maintenance  Topic Date Due  . PNA vac Low Risk Adult (2 of 2 - PPSV23) 01/12/2016  . DEXA SCAN  09/27/2020 (Originally 08/10/2010)  . TETANUS/TDAP  09/28/2023 (Originally 08/26/2014)  . INFLUENZA VACCINE  03/26/2017  . COLONOSCOPY  04/10/2017  . MAMMOGRAM  09/17/2018  . Hepatitis C Screening  Completed      Plan:    Follow-up w/ PCP as scheduled.   I have personally reviewed and noted the following in the patient's chart:   . Medical and social history . Use of alcohol, tobacco or illicit drugs  . Current medications and supplements . Functional ability and status . Nutritional status . Physical activity . Advanced directives . List of other physicians . Vitals . Screenings to include cognitive, depression, and falls . Referrals and appointments  In addition, I have reviewed and discussed with patient certain preventive protocols, quality metrics, and best practice recommendations. A written personalized care plan for preventive services as well as general preventive health recommendations  were provided to patient.     Dorrene German, RN  03/11/2017

## 2017-03-09 ENCOUNTER — Telehealth: Payer: Self-pay | Admitting: Family Medicine

## 2017-03-09 DIAGNOSIS — R739 Hyperglycemia, unspecified: Secondary | ICD-10-CM

## 2017-03-09 DIAGNOSIS — E7800 Pure hypercholesterolemia, unspecified: Secondary | ICD-10-CM

## 2017-03-09 DIAGNOSIS — I10 Essential (primary) hypertension: Secondary | ICD-10-CM

## 2017-03-09 NOTE — Telephone Encounter (Signed)
-----   Message from Marchia Bond sent at 03/05/2017  5:17 PM EDT ----- Regarding: Cpx labs Tues 7/17, need orders. Thanks! :-) Please order  future cpx labs for pt's upcoming lab appt. Thanks Aniceto Boss

## 2017-03-11 ENCOUNTER — Ambulatory Visit (INDEPENDENT_AMBULATORY_CARE_PROVIDER_SITE_OTHER): Payer: Medicare Other

## 2017-03-11 ENCOUNTER — Other Ambulatory Visit (INDEPENDENT_AMBULATORY_CARE_PROVIDER_SITE_OTHER): Payer: Medicare Other

## 2017-03-11 ENCOUNTER — Ambulatory Visit (INDEPENDENT_AMBULATORY_CARE_PROVIDER_SITE_OTHER): Payer: Medicare Other | Admitting: Family Medicine

## 2017-03-11 VITALS — BP 134/78 | HR 58 | Temp 98.0°F | Resp 16 | Ht 59.5 in | Wt 136.0 lb

## 2017-03-11 DIAGNOSIS — Z0000 Encounter for general adult medical examination without abnormal findings: Secondary | ICD-10-CM | POA: Diagnosis not present

## 2017-03-11 DIAGNOSIS — Z1211 Encounter for screening for malignant neoplasm of colon: Secondary | ICD-10-CM | POA: Diagnosis not present

## 2017-03-11 DIAGNOSIS — M8580 Other specified disorders of bone density and structure, unspecified site: Secondary | ICD-10-CM | POA: Diagnosis not present

## 2017-03-11 DIAGNOSIS — E7800 Pure hypercholesterolemia, unspecified: Secondary | ICD-10-CM

## 2017-03-11 DIAGNOSIS — R739 Hyperglycemia, unspecified: Secondary | ICD-10-CM

## 2017-03-11 DIAGNOSIS — I10 Essential (primary) hypertension: Secondary | ICD-10-CM

## 2017-03-11 LAB — LIPID PANEL
Cholesterol: 197 mg/dL (ref 0–200)
HDL: 58.5 mg/dL (ref >39.00)
NONHDL: 138.15
Total CHOL/HDL Ratio: 3
Triglycerides: 348 mg/dL — ABNORMAL HIGH (ref 0.0–149.0)
VLDL: 69.6 mg/dL — ABNORMAL HIGH (ref 0.0–40.0)

## 2017-03-11 LAB — COMPREHENSIVE METABOLIC PANEL
ALK PHOS: 61 U/L (ref 39–117)
ALT: 33 U/L (ref 0–35)
AST: 30 U/L (ref 0–37)
Albumin: 4.6 g/dL (ref 3.5–5.2)
BILIRUBIN TOTAL: 0.5 mg/dL (ref 0.2–1.2)
BUN: 26 mg/dL — AB (ref 6–23)
CO2: 26 mEq/L (ref 19–32)
Calcium: 10.5 mg/dL (ref 8.4–10.5)
Chloride: 99 mEq/L (ref 96–112)
Creatinine, Ser: 0.99 mg/dL (ref 0.40–1.20)
GFR: 58.67 mL/min — ABNORMAL LOW (ref >60.00)
GLUCOSE: 123 mg/dL — AB (ref 70–99)
POTASSIUM: 4.7 meq/L (ref 3.5–5.1)
SODIUM: 134 meq/L — AB (ref 135–145)
TOTAL PROTEIN: 7.9 g/dL (ref 6.0–8.3)

## 2017-03-11 LAB — CBC WITH DIFFERENTIAL/PLATELET
BASOS ABS: 0.1 10*3/uL (ref 0.0–0.1)
Basophils Relative: 0.8 % (ref 0.0–3.0)
EOS ABS: 0.1 10*3/uL (ref 0.0–0.7)
Eosinophils Relative: 1.3 % (ref 0.0–5.0)
HCT: 38.5 % (ref 36.0–46.0)
Hemoglobin: 13 g/dL (ref 12.0–15.0)
LYMPHS ABS: 2 10*3/uL (ref 0.7–4.0)
Lymphocytes Relative: 28.3 % (ref 12.0–46.0)
MCHC: 33.9 g/dL (ref 30.0–36.0)
MCV: 95.9 fl (ref 78.0–100.0)
MONO ABS: 0.7 10*3/uL (ref 0.1–1.0)
Monocytes Relative: 9.5 % (ref 3.0–12.0)
NEUTROS PCT: 60.1 % (ref 43.0–77.0)
Neutro Abs: 4.2 10*3/uL (ref 1.4–7.7)
Platelets: 316 10*3/uL (ref 150.0–400.0)
RBC: 4.01 Mil/uL (ref 3.87–5.11)
RDW: 13.8 % (ref 11.5–15.5)
WBC: 7 10*3/uL (ref 4.0–10.5)

## 2017-03-11 LAB — TSH: TSH: 2.54 u[IU]/mL (ref 0.35–4.50)

## 2017-03-11 LAB — LDL CHOLESTEROL, DIRECT: Direct LDL: 98 mg/dL

## 2017-03-11 LAB — HEMOGLOBIN A1C: Hgb A1c MFr Bld: 6.2 % (ref 4.6–6.5)

## 2017-03-11 MED ORDER — ATORVASTATIN CALCIUM 20 MG PO TABS: 20.0000 mg | ORAL_TABLET | Freq: Every day | ORAL | 3 refills | Status: DC

## 2017-03-11 MED ORDER — LISINOPRIL 20 MG PO TABS: 20.0000 mg | ORAL_TABLET | Freq: Every day | ORAL | 3 refills | Status: DC

## 2017-03-11 MED ORDER — ATENOLOL 25 MG PO TABS: 25.0000 mg | ORAL_TABLET | Freq: Every day | ORAL | 3 refills | Status: DC

## 2017-03-11 NOTE — Assessment & Plan Note (Signed)
Due for A1C disc imp of low glycemic diet and wt loss to prevent DM2  

## 2017-03-11 NOTE — Progress Notes (Signed)
PCP notes:   Health maintenance: Tetanus- overdue, patient to check w/ insurance regarding coverage  PPSV-23- due, patient reports she previously had another facility. She will check her records at home for date.   CCS- due. She would like to know if PCP has any specific recommendations for another provider since Dr. Olevia Perches retired.  Abnormal screenings:  Hearing-failed L ear at 500 Hz only.  Patient concerns:  1) R hip pain x6 months or more 2) She sometimes gets an upset stomach/diarrhea after eating out and would like to know if there are any medications she could take to prevent this.   Nurse concerns: None   Next PCP appt: 03/11/2017 @ 10:45am  I reviewed health advisor's note, was available for consultation, and agree with documentation and plan. Loura Pardon MD

## 2017-03-11 NOTE — Assessment & Plan Note (Signed)
Disc goals for lipids and reasons to control them Rev labs with pt (prior)  Rev low sat fat diet in detail  Continue atorvastatin  Enc to eat less shellfish

## 2017-03-11 NOTE — Assessment & Plan Note (Signed)
Due for screening colonoscopy  Hx of polyps Ref done

## 2017-03-11 NOTE — Patient Instructions (Addendum)
You may have some IBS - get a fiber supplement and take it daily  This may improve diarrhea   After age 72 you need a prevnar and a pneumovax 23  prevnar was done in 2016  You may need a pneumovax 23 -check your records and get it at the pharmacy and let us know the date   For cholesterol Avoid red meat/ fried foods/ egg yolks/ fatty breakfast meats/ butter, cheese and high fat dairy/ and shellfish    Labs today   Take care of yourself   We will refer you for a colonoscopy

## 2017-03-11 NOTE — Patient Instructions (Addendum)
Cindy [name] , Thank you for taking time to come for your Medicare Wellness Visit. I appreciate your ongoing commitment to your health goals. Please review the following plan we discussed and let me know if I can assist you in the future.   These are the goals we discussed: Goals    . Increase physical activity (pt-stated)          Starting 03/11/2017, I will resume exercise as tolerated for at least 30 min 2-3 days per week.        This is a list of the screening recommended for you and due dates:  Health Maintenance  Topic Date Due  . Pneumonia vaccines (2 of 2 - PPSV23) 01/12/2016  . DEXA scan (bone density measurement)  09/27/2020*  . Tetanus Vaccine  09/28/2023*  . Flu Shot  03/26/2017  . Colon Cancer Screening  04/10/2017  . Mammogram  09/17/2018  .  Hepatitis C: One time screening is recommended by Center for Disease Control  (CDC) for  adults born from [birth year] through 1966.   Completed  *Topic was postponed. The date shown is not the original due date.   Preventive Care for Adults  A healthy lifestyle and preventive care can promote health and wellness. Preventive health guidelines for adults include the following key practices.  . A routine yearly physical is a good way to check with your health care provider about your health and preventive screening. It is a chance to share any concerns and updates on your health and to receive a thorough exam.  . Visit your dentist for a routine exam and preventive care every 6 months. Brush your teeth twice a day and floss once a day. Good oral hygiene prevents tooth decay and gum disease.  . The frequency of eye exams is based on your age, health, family medical history, use  of contact lenses, and other factors. Follow your health care provider's ecommendations for frequency of eye exams.  . Eat a healthy diet. Foods like vegetables, fruits, whole grains, low-fat dairy products, and lean protein foods contain the nutrients you need  without too many calories. Decrease your intake of foods high in solid fats, added sugars, and salt. Eat the right amount of calories for you. Get information about a proper diet from your health care provider, if necessary.  . Regular physical exercise is one of the most important things you can do for your health. Most adults should get at least 150 minutes of moderate-intensity exercise (any activity that increases your heart rate and causes you to sweat) each week. In addition, most adults need muscle-strengthening exercises on 2 or more days a week.  Silver Sneakers may be a benefit available to you. To determine eligibility, you may visit the website: www.silversneakers.com or contact program at 513-788-8718 Mon-Fri between 8AM-8PM.   . Maintain a healthy weight. The body mass index (BMI) is a screening tool to identify possible weight problems. It provides an estimate of body fat based on height and weight. Your health care provider can find your BMI and can help you achieve or maintain a healthy weight.   For adults 20 years and older: ? A BMI below 18.5 is considered underweight. ? A BMI of 18.5 to 24.9 is normal. ? A BMI of 25 to 29.9 is considered overweight. ? A BMI of 30 and above is considered obese.   . Maintain normal blood lipids and cholesterol levels by exercising and minimizing your intake of saturated fat. Eat  a balanced diet with plenty of fruit and vegetables. Blood tests for lipids and cholesterol should begin at age 17 and be repeated every 5 years. If your lipid or cholesterol levels are high, you are over 50, or you are at high risk for heart disease, you may need your cholesterol levels checked more frequently. Ongoing high lipid and cholesterol levels should be treated with medicines if diet and exercise are not working.  . If you smoke, find out from your health care provider how to quit. If you do not use tobacco, please do not start.  . If you choose to drink  alcohol, please do not consume more than 2 drinks per day. One drink is considered to be 12 ounces (355 mL) of beer, 5 ounces (148 mL) of wine, or 1.5 ounces (44 mL) of liquor.  . If you are 34-3 years old, ask your health care provider if you should take aspirin to prevent strokes.  . Use sunscreen. Apply sunscreen liberally and repeatedly throughout the day. You should seek shade when your shadow is shorter than you. Protect yourself by wearing long sleeves, pants, a wide-brimmed hat, and sunglasses year round, whenever you are outdoors.  . Once a month, do a whole body skin exam, using a mirror to look at the skin on your back. Tell your health care provider of new moles, moles that have irregular borders, moles that are larger than a pencil eraser, or moles that have changed in shape or color.

## 2017-03-11 NOTE — Assessment & Plan Note (Signed)
Pt declines further dexa or treatment  Takes D3 No falls or fx

## 2017-03-11 NOTE — Assessment & Plan Note (Signed)
bp in fair control at this time  BP Readings from Last 1 Encounters:  03/11/17 134/78   No changes needed Disc lifstyle change with low sodium diet and exercise  Labs reviewed

## 2017-03-11 NOTE — Progress Notes (Signed)
Subjective:    Patient ID: Cindy [name], female    DOB: [birth year], 71 y.o.   MRN: [id number]  HPI  Here for annual f/u of chronic health problems   Feeling pretty good overall   R hip bothers her a little - outside of the hip / sore to sleep on that side  Steps bother it    Had AMW today  No concerns     Wt Readings from Last 3 Encounters:  03/11/17 136 lb (61.7 kg)  03/11/17 136 lb (61.7 kg)  03/07/16 135 lb 8 oz (61.5 kg)  stable  Is planning to go back to exercise   27.01 kg/m   Mammogram 1/18 neg Self exam- no lumps    Colonoscopy 8/11 - recall 5-7 years  Last saw Dr Olevia Perches   Chart says due for PNA 23     She wants to to get it it pharmacy if she needs it   dexa -declines , would not treat  Hx of osteopenia  She takes D3 for her bones  No regular exercise  No falls or fx    Declines tetanus shot   zostavax 9/14  bp is stable today  No cp or palpitations or headaches or edema  No side effects to medicines  BP Readings from Last 3 Encounters:  03/11/17 134/78  03/11/17 134/78  03/07/16 (!) 142/76      Hx of hyperglycemia Lab Results  Component Value Date   HGBA1C 6.1 03/04/2016   due for lab  She thinks it will be the same  Eating has not changed   Dermatology f/u - has been for f/u recently / froze an ak   Hyperlipidemia Lab Results  Component Value Date   CHOL 192 03/04/2016   HDL 61.60 03/04/2016   LDLCALC 76 02/07/2014   LDLDIRECT 102.0 03/04/2016   TRIG 242.0 (H) 03/04/2016   CHOLHDL 3 03/04/2016   Due for lab Thinks that will be similar  She is eating some shrimp    On atorvastatin 20 and diet   Patient Active Problem List   Diagnosis Date Noted  . Colon cancer screening 03/11/2017  . Routine general medical examination at a health care facility 02/28/2015  . Encounter for Medicare annual wellness exam 02/03/2013  . Osteopenia 02/03/2012  . Hyperglycemia 10/10/2010  . COLONIC POLYPS 03/05/2010  . Hyperlipidemia  07/27/2009  . Essential hypertension 07/27/2009  . BASAL CELL CARCINOMA, HX OF 07/27/2009   Past Medical History:  Diagnosis Date  . Benign neoplasm of colon   . MVP (mitral valve prolapse)    very mild  . Other abnormal glucose   . Other and unspecified hyperlipidemia   . Personal history of other malignant neoplasm of skin   . Unspecified essential hypertension    Past Surgical History:  Procedure Laterality Date  . CARDIAC CATHETERIZATION  1995   normal (after abnormal stress test)  . COLONOSCOPY  07/01/03   polyps  . MOHS SURGERY  11/09   for basal cell lesion   Social History  Substance Use Topics  . Smoking status: Never Smoker  . Smokeless tobacco: Never Used  . Alcohol use 4.2 - 8.4 oz/week    7 - 14 Shots of liquor per week     Comment: Regular   Family History  Problem Relation Age of Onset  . Macular degeneration Mother   . Sudden death Father 39  . Hyperlipidemia Brother   . Coronary artery disease Brother  CABG  . Hypertension Brother    Allergies  Allergen Reactions  . Ampicillin     REACTION: rash  . Calcium     REACTION: GI intolerance  . Cephalexin     REACTION: rash   Current Outpatient Prescriptions on File Prior to Visit  Medication Sig Dispense Refill  . Multiple Vitamins-Minerals (CENTRUM PO) Take 1 tablet by mouth daily.    Marland Kitchen triamcinolone cream (KENALOG) 0.1 % Apply 1 application topically 2 (two) times daily as needed.  2   No current facility-administered medications on file prior to visit.     Review of Systems    Review of Systems  Constitutional: Negative for fever, appetite change, fatigue and unexpected weight change.  Eyes: Negative for pain and visual disturbance.  Respiratory: Negative for cough and shortness of breath.   Cardiovascular: Negative for cp or palpitations    Gastrointestinal: Negative for nausea,  and constipation. pos for occ diarrhea after eating  Genitourinary: Negative for urgency and frequency.    Skin: Negative for pallor or rash   MSK pos for R hip pain  Neurological: Negative for weakness, light-headedness, numbness and headaches.  Hematological: Negative for adenopathy. Does not bruise/bleed easily.  Psychiatric/Behavioral: Negative for dysphoric mood. The patient is not nervous/anxious.      Objective:   Physical Exam  Constitutional: She appears well-developed and well-nourished. No distress.  overwt and well appearing   HENT:  Head: Normocephalic and atraumatic.  Right Ear: External ear normal.  Left Ear: External ear normal.  Mouth/Throat: Oropharynx is clear and moist.  Eyes: Pupils are equal, round, and reactive to light. Conjunctivae and EOM are normal. No scleral icterus.  Neck: Normal range of motion. Neck supple. No JVD present. Carotid bruit is not present. No thyromegaly present.  Cardiovascular: Normal rate, regular rhythm, normal heart sounds and intact distal pulses.  Exam reveals no gallop.   Pulmonary/Chest: Effort normal and breath sounds normal. No respiratory distress. She has no wheezes. She exhibits no tenderness.  Abdominal: Soft. Bowel sounds are normal. She exhibits no distension, no abdominal bruit and no mass. There is no tenderness.  Genitourinary: No breast swelling, tenderness, discharge or bleeding.  Genitourinary Comments: Breast exam: No mass, nodules, thickening, tenderness, bulging, retraction, inflamation, nipple discharge or skin changes noted.  No axillary or clavicular LA.      Musculoskeletal: Normal range of motion. She exhibits no edema or tenderness.  No kypyosis   Lymphadenopathy:    She has no cervical adenopathy.  Neurological: She is alert. She has normal reflexes. No cranial nerve deficit. She exhibits normal muscle tone. Coordination normal.  Skin: Skin is warm and dry. No rash noted. No erythema. No pallor.  Solar lentigines diffusely   Psychiatric: She has a normal mood and affect.          Assessment & Plan:    Problem List Items Addressed This Visit      Cardiovascular and Mediastinum   Essential hypertension - Primary    bp in fair control at this time  BP Readings from Last 1 Encounters:  03/11/17 134/78   No changes needed Disc lifstyle change with low sodium diet and exercise  Labs reviewed       Relevant Medications   atenolol (TENORMIN) 25 MG tablet   atorvastatin (LIPITOR) 20 MG tablet   lisinopril (PRINIVIL,ZESTRIL) 20 MG tablet     Musculoskeletal and Integument   Osteopenia    Pt declines further dexa or treatment  Takes D3  No falls or fx         Other   Colon cancer screening    Due for screening colonoscopy  Hx of polyps Ref done       Relevant Orders   Ambulatory referral to Gastroenterology   Hyperglycemia    Due for A1C disc imp of low glycemic diet and wt loss to prevent DM2       Hyperlipidemia    Disc goals for lipids and reasons to control them Rev labs with pt (prior)  Rev low sat fat diet in detail  Continue atorvastatin  Enc to eat less shellfish       Relevant Medications   atenolol (TENORMIN) 25 MG tablet   atorvastatin (LIPITOR) 20 MG tablet   lisinopril (PRINIVIL,ZESTRIL) 20 MG tablet

## 2017-03-31 ENCOUNTER — Encounter: Payer: Self-pay | Admitting: Family Medicine

## 2017-03-31 ENCOUNTER — Ambulatory Visit (INDEPENDENT_AMBULATORY_CARE_PROVIDER_SITE_OTHER): Payer: Medicare Other | Admitting: Family Medicine

## 2017-03-31 ENCOUNTER — Telehealth: Payer: Self-pay | Admitting: Family Medicine

## 2017-03-31 VITALS — BP 140/70 | HR 71 | Temp 99.0°F | Ht 59.5 in | Wt 138.0 lb

## 2017-03-31 DIAGNOSIS — M7061 Trochanteric bursitis, right hip: Secondary | ICD-10-CM | POA: Diagnosis not present

## 2017-03-31 DIAGNOSIS — M76891 Other specified enthesopathies of right lower limb, excluding foot: Secondary | ICD-10-CM

## 2017-03-31 DIAGNOSIS — R29898 Other symptoms and signs involving the musculoskeletal system: Secondary | ICD-10-CM | POA: Diagnosis not present

## 2017-03-31 NOTE — Telephone Encounter (Signed)
Pt dropped off immunization record to be added to chart. Placed on cart.

## 2017-03-31 NOTE — Progress Notes (Signed)
Dr. Frederico Hamman T. Aubrie Lucien, MD, [facility] [place], [address] Phone: [phone] Fax: [phone]  03/31/2017  Patient: Cindy [name], MRN: [id number], DOB: [birth year], 71 y.o.  Primary Physician:  Tower, Wynelle Fanny, MD   Chief Complaint  Patient presents with  . Hip Pain    Right   Subjective:   Cindy [name] is a 71 y.o. very pleasant female patient who presents with the following:  R lateral hip pain. Has been there for a long time. Climbing, driving it will.  Months - better part of a year.   She denies any specific injury. She has never had any and intervention, she has never had physical therapy, and she does not ever have any history of having had a injection.  She is still active and ambulatory, works out at [employer], and walks routinely. The pain is primarily laterally on the right side. She denies groin pain.  Past Medical History, Surgical History, Social History, Family History, Problem List, Medications, and Allergies have been reviewed and updated if relevant.  Patient Active Problem List   Diagnosis Date Noted  . Colon cancer screening 03/11/2017  . Routine general medical examination at a health care facility 02/28/2015  . Encounter for Medicare annual wellness exam 02/03/2013  . Osteopenia 02/03/2012  . Hyperglycemia 10/10/2010  . COLONIC POLYPS 03/05/2010  . Hyperlipidemia 07/27/2009  . Essential hypertension 07/27/2009  . BASAL CELL CARCINOMA, HX OF 07/27/2009    Past Medical History:  Diagnosis Date  . Benign neoplasm of colon   . MVP (mitral valve prolapse)    very mild  . Other abnormal glucose   . Other and unspecified hyperlipidemia   . Personal history of other malignant neoplasm of skin   . Unspecified essential hypertension     Past Surgical History:  Procedure Laterality Date  . CARDIAC CATHETERIZATION  1995   normal (after abnormal stress test)  . COLONOSCOPY  07/01/03   polyps  . MOHS SURGERY  11/09   for basal cell lesion    Social History   Social History  . Marital status: Widowed    Spouse name: N/A  . Number of children: N/A  . Years of education: N/A   Occupational History  . retired    Social History Main Topics  . Smoking status: Never Smoker  . Smokeless tobacco: Never Used  . Alcohol use 4.2 - 8.4 oz/week    7 - 14 Shots of liquor per week     Comment: Regular  . Drug use: No  . Sexual activity: No   Other Topics Concern  . Not on file   Social History Narrative   Retired-med tech      Married-husband has advanced parkinson's      No regular exercise    Family History  Problem Relation Age of Onset  . Macular degeneration Mother   . Sudden death Father 33  . Hyperlipidemia Brother   . Coronary artery disease Brother        CABG  . Hypertension Brother     Allergies  Allergen Reactions  . Ampicillin     REACTION: rash  . Calcium     REACTION: GI intolerance  . Cephalexin     REACTION: rash    Medication list reviewed and updated in full in [facility].  GEN: No fevers, chills. Nontoxic. Primarily MSK c/o today. MSK: Detailed in the HPI GI: tolerating PO intake  without difficulty Neuro: No numbness, parasthesias, or tingling associated. Otherwise the pertinent positives of the ROS are noted above.   Objective:   BP 140/70   Pulse 71   Temp 99 F (37.2 C) (Oral)   Ht 4' 11.5" (1.511 m)   Wt 138 lb (62.6 kg)   BMI 27.41 kg/m    GEN: WDWN, NAD, Non-toxic, Alert & Oriented x 3 HEENT: Atraumatic, Normocephalic.  Ears and Nose: No external deformity. EXTR: No clubbing/cyanosis/edema NEURO: Normal gait.  PSYCH: Normally interactive. Conversant. Not depressed or anxious appearing.  Calm demeanor.   HIP EXAM: SIDE: R ROM: Abduction, Flexion, Internal and External range of motion: full Pain with terminal IROM and EROM: none GTB: mild-mod on the R SLR: NEG Knees: No effusion FABER:  NT REVERSE FABER: NT, neg Piriformis: NT at direct palpation Str: flexion: 4/5, pain on the R abduction: 3+/5 on the R, 4+ on the L adduction: 4+/5  Radiology: No results found.  Assessment and Plan:   Trochanteric bursitis of right hip  Hip flexor tendinitis, right  Weakness of both hips  >25 minutes spent in face to face time with patient, >50% spent in counselling or coordination of care   Moderately symptomatic with very notable weakness of her pelvic support structures. I think that high likelihood that this will improve and improve long-term if she works on these diligently.  A rehabilitation program from the [employer] was reviewed with the patient face to face for their condition.   Follow-up: prn  Future Appointments Date Time Provider [place]  03/16/2018 10:30 AM Eustace Pen, LPN [facility]  03/16/2018 11:30 AM Tower, Wynelle Fanny, MD [facility]   Signed,  Maud Deed. Trenden Hazelrigg, MD   Allergies as of 03/31/2017      Reactions   Ampicillin    REACTION: rash   Calcium    REACTION: GI intolerance   Cephalexin    REACTION: rash      Medication List       Accurate as of 03/31/17  1:21 PM. Always use your most recent med list.          atenolol 25 MG tablet Commonly known as:  TENORMIN Take 1 tablet (25 mg total) by mouth daily.   atorvastatin 20 MG tablet Commonly known as:  LIPITOR Take 1 tablet (20 mg total) by mouth daily.   CENTRUM PO Take 1 tablet by mouth daily.   lisinopril 20 MG tablet Commonly known as:  PRINIVIL,ZESTRIL Take 1 tablet (20 mg total) by mouth daily.   triamcinolone cream 0.1 % Commonly known as:  KENALOG Apply 1 application topically 2 (two) times daily as needed.   Vitamin D-3 1000 units Caps Take 1 capsule by mouth daily.

## 2017-04-01 NOTE — Telephone Encounter (Signed)
Chart updated with immunizations Dr. Glori Bickers advise me to update

## 2017-04-21 IMAGING — DX DG SHOULDER 2+V*L*
3 series · 4 of 4 positions shown · non-contrast
Comparison: None in PACs

CLINICAL DATA: Left shoulder pain without history of trauma

EXAM:
LEFT SHOULDER - 2+ VIEW

[shoulder axial]
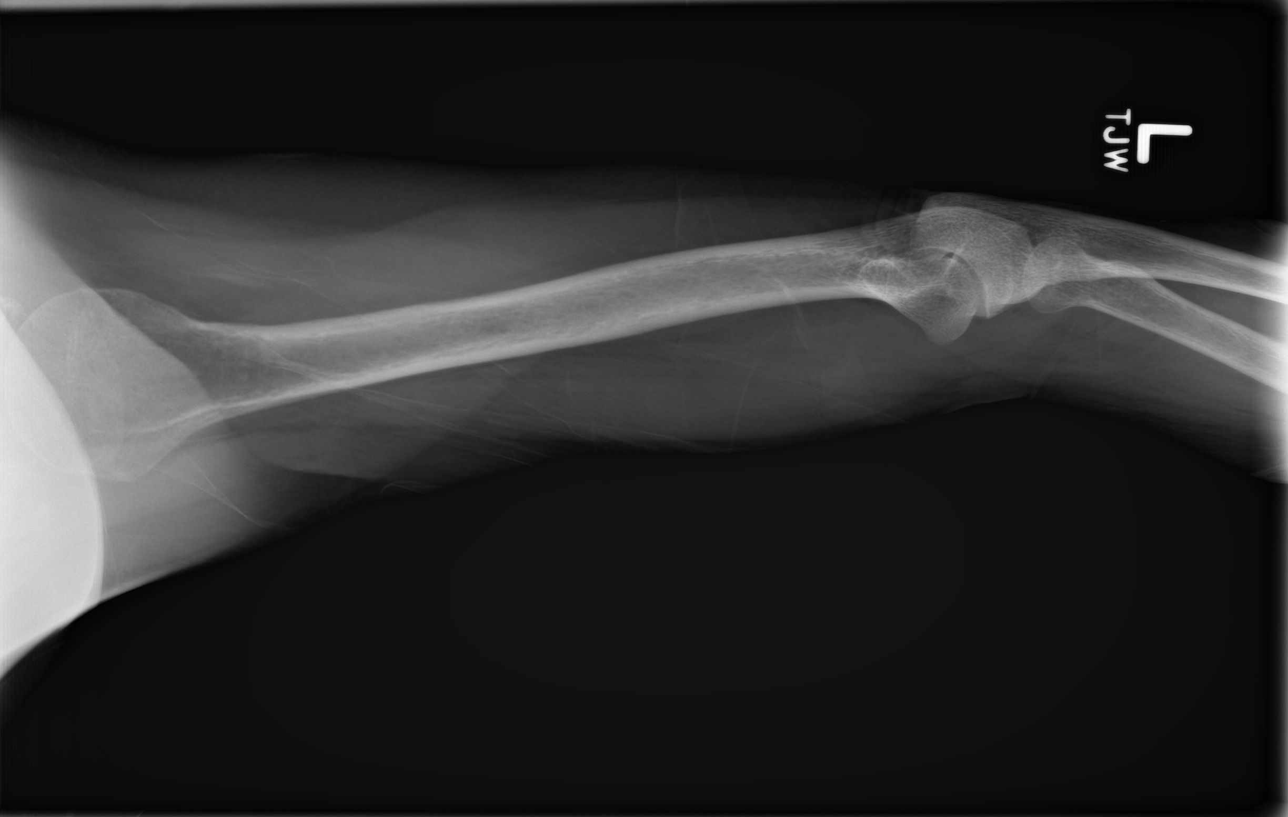

[shoulder ap]
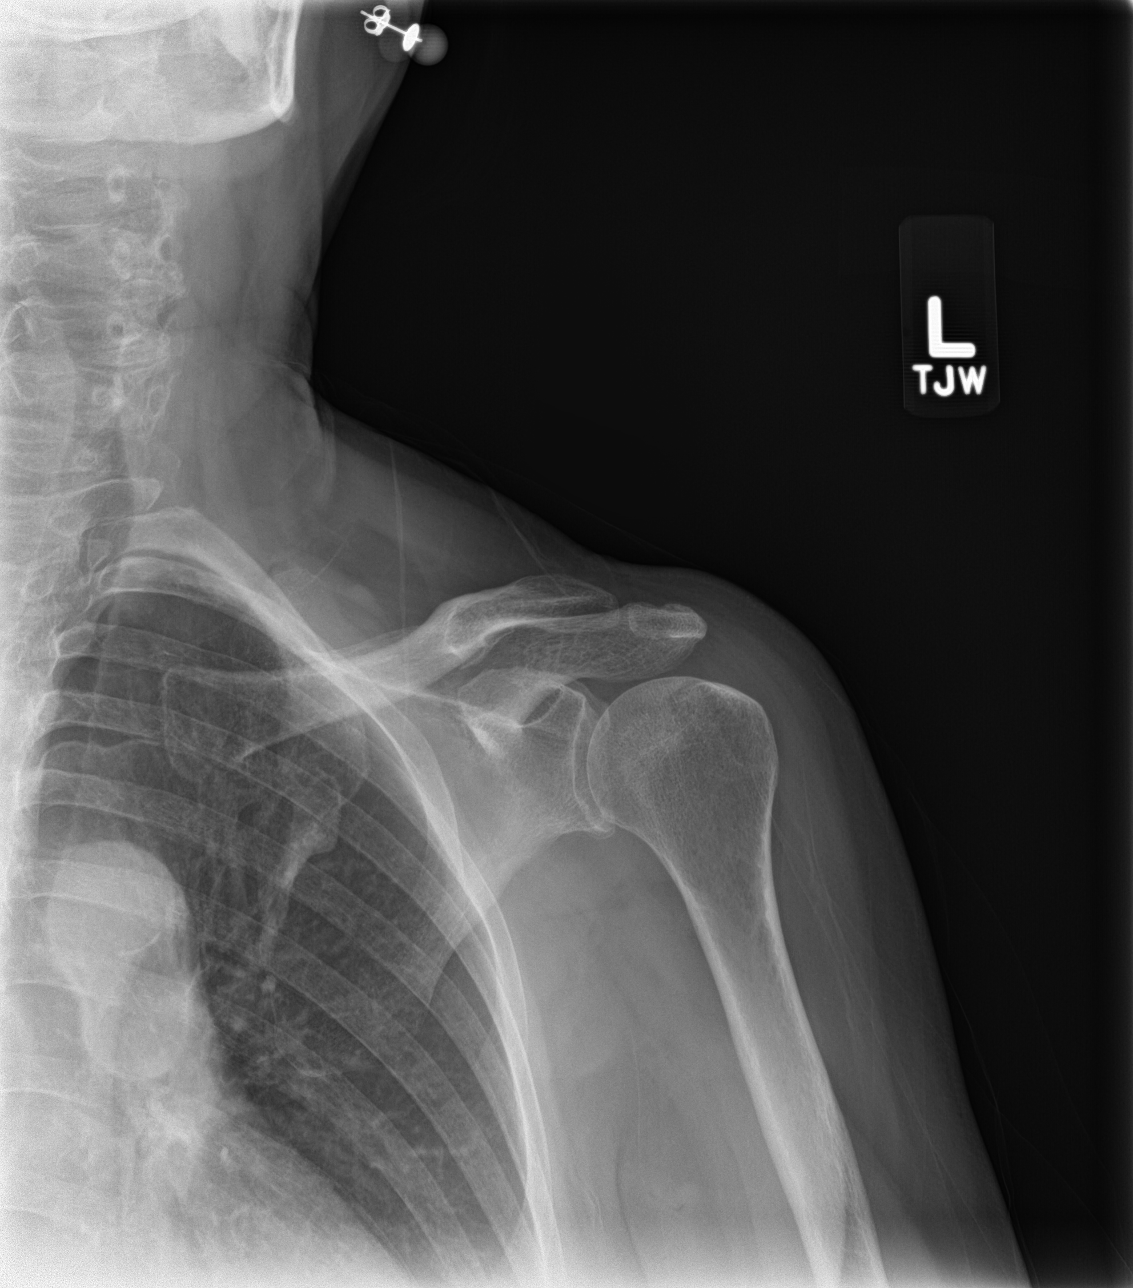

[Series 5: shoulder y-view · 0.14mm/px · 2 of 2 slices shown]
[im 1/2]
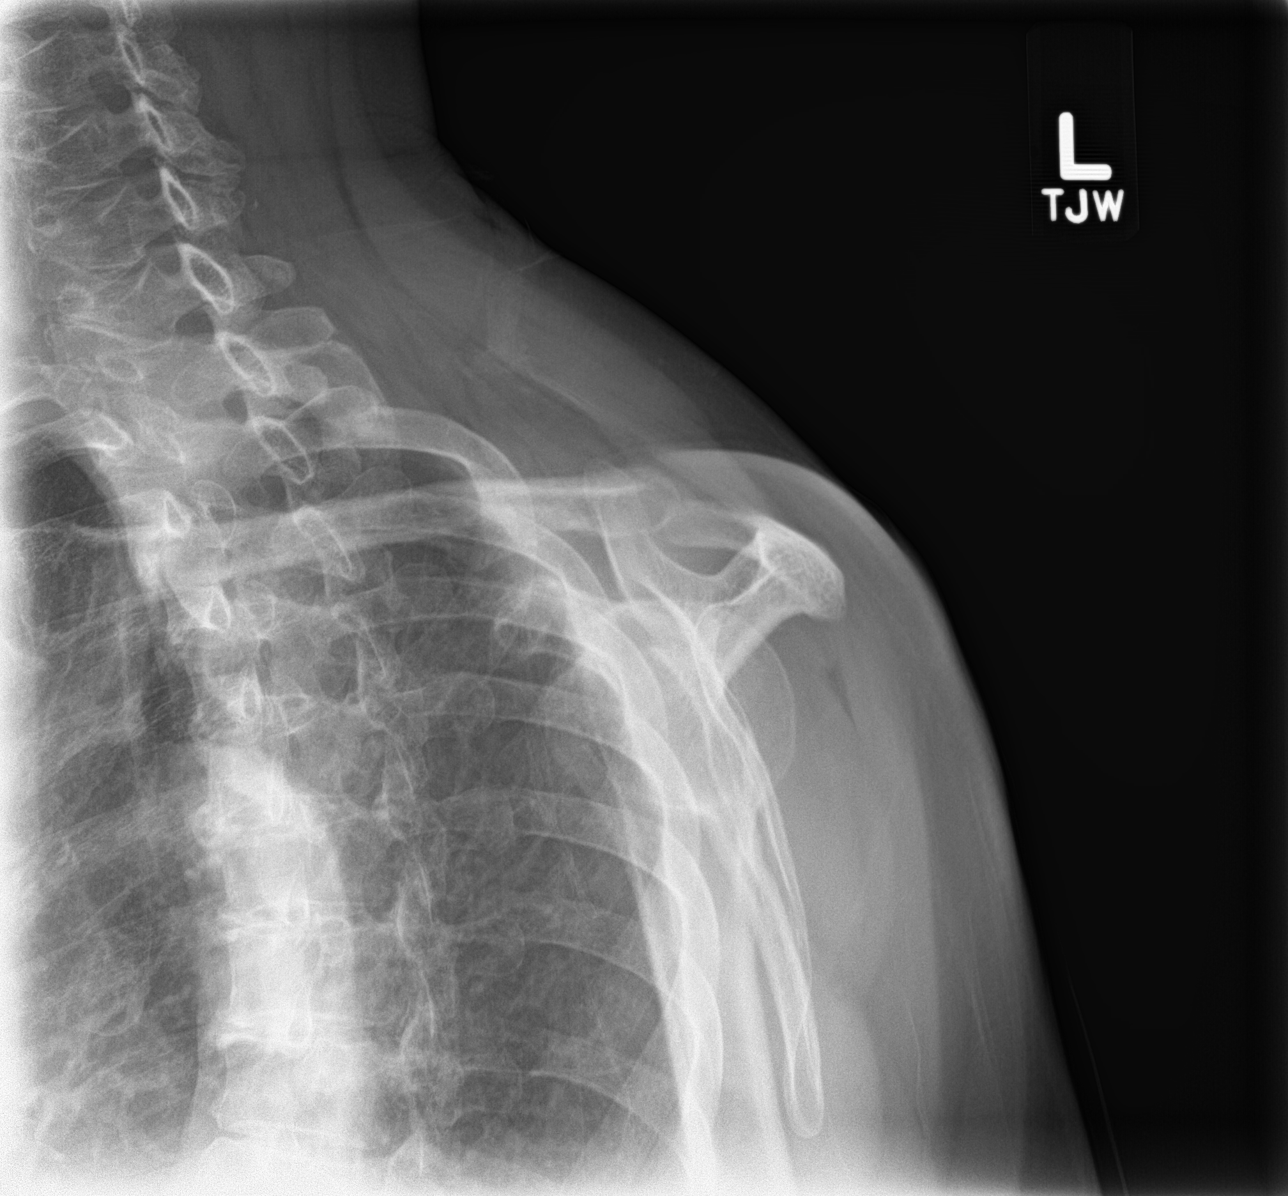
[im 2/2]
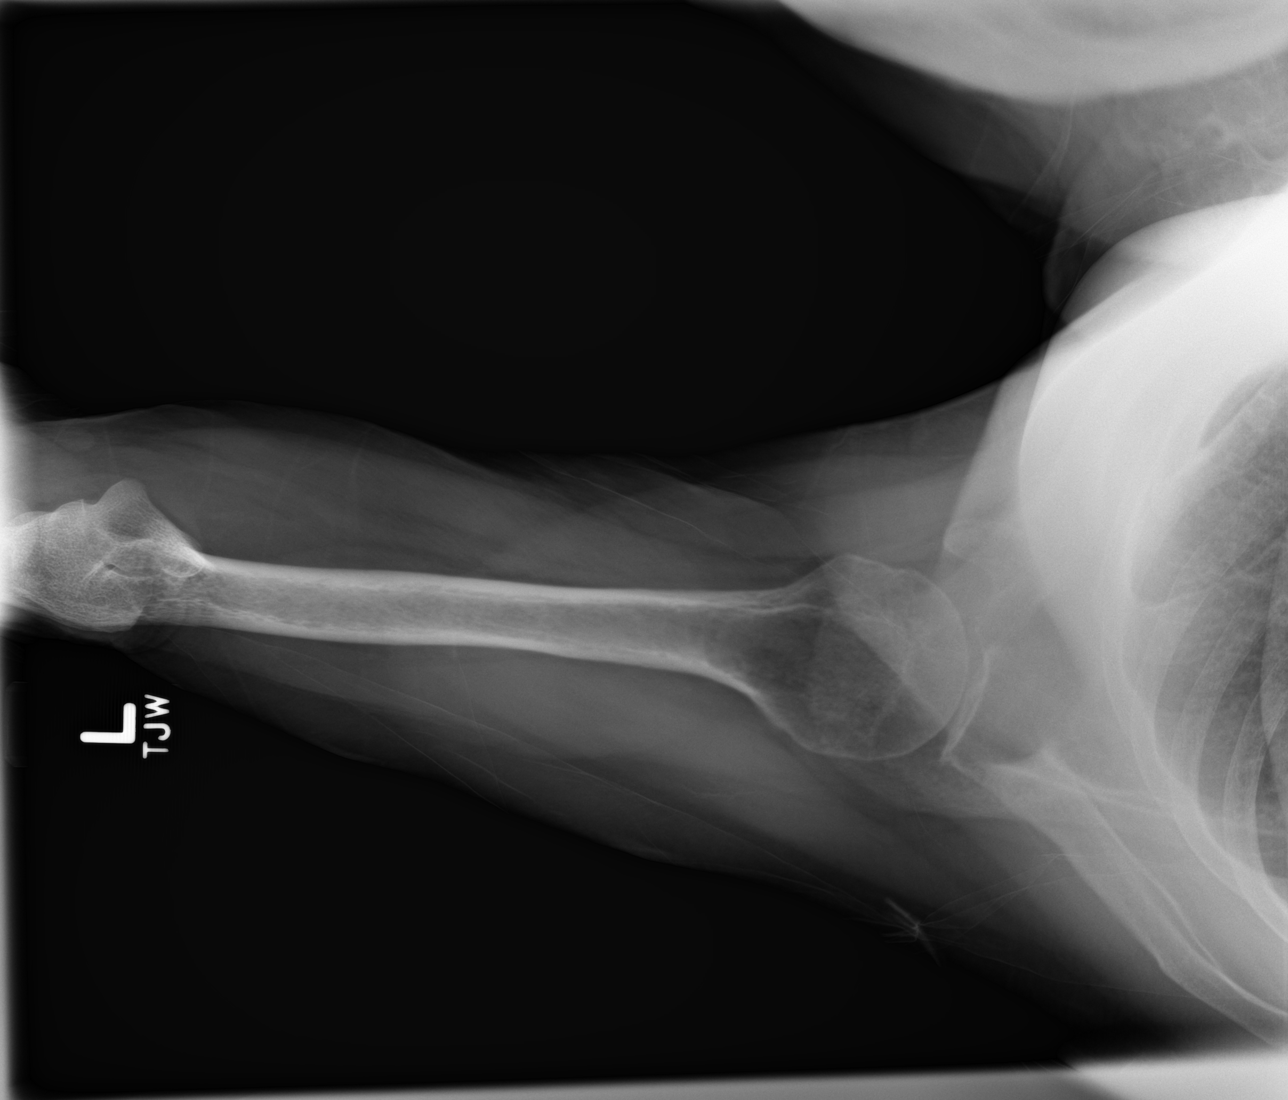

[4 of 4 positions shown; findings below may reference images not displayed]

FINDINGS: The bones of the left shoulder are adequately mineralized. The
glenohumeral and AC joint spaces are preserved. The articular
surfaces of the bony glenoid and humeral head remains smoothly
rounded. The subacromial subdeltoid space appears preserved. The
observed portions of the left clavicle and upper left ribs are
normal.
IMPRESSION: There is no acute or significant chronic bony abnormality of the
left shoulder.

## 2017-04-30 ENCOUNTER — Encounter: Payer: Self-pay | Admitting: Gastroenterology

## 2017-06-18 DIAGNOSIS — Z23 Encounter for immunization: Secondary | ICD-10-CM | POA: Diagnosis not present

## 2017-06-20 ENCOUNTER — Ambulatory Visit (AMBULATORY_SURGERY_CENTER): Payer: Self-pay | Admitting: *Deleted

## 2017-06-20 VITALS — Ht 60.0 in | Wt 140.6 lb

## 2017-06-20 DIAGNOSIS — Z86010 Personal history of colonic polyps: Secondary | ICD-10-CM

## 2017-06-20 MED ORDER — NA SULFATE-K SULFATE-MG SULF 17.5-3.13-1.6 GM/177ML PO SOLN: 1.0000 | Freq: Once | ORAL | 0 refills | Status: AC

## 2017-06-20 NOTE — Progress Notes (Signed)
Denies allergies to eggs or soy products. Denies complications with sedation or anesthesia. Denies O2 use. Denies use of diet or weight loss medications.  Emmi instructions given for colonoscopy.  

## 2017-07-04 ENCOUNTER — Ambulatory Visit (AMBULATORY_SURGERY_CENTER): Payer: Medicare Other | Admitting: Gastroenterology

## 2017-07-04 ENCOUNTER — Other Ambulatory Visit: Payer: Self-pay

## 2017-07-04 ENCOUNTER — Encounter: Payer: Self-pay | Admitting: Gastroenterology

## 2017-07-04 VITALS — BP 123/74 | HR 59 | Temp 97.5°F | Resp 9 | Ht 60.0 in | Wt 140.0 lb

## 2017-07-04 DIAGNOSIS — D123 Benign neoplasm of transverse colon: Secondary | ICD-10-CM

## 2017-07-04 DIAGNOSIS — Z86010 Personal history of colonic polyps: Secondary | ICD-10-CM | POA: Diagnosis not present

## 2017-07-04 DIAGNOSIS — I10 Essential (primary) hypertension: Secondary | ICD-10-CM | POA: Diagnosis not present

## 2017-07-04 MED ORDER — SODIUM CHLORIDE 0.9 % IV SOLN: 500.0000 mL | INTRAVENOUS | Status: DC

## 2017-07-04 NOTE — Patient Instructions (Addendum)
Impression/recommendations:  Polyp (handout given) Diverticulosis (handout given)  Fiberchoice or Benefiber 2-3 times with meals daily.  YOU HAD AN ENDOSCOPIC PROCEDURE TODAY AT [facility]:   Refer to the procedure report that was given to you for any specific questions about what was found during the examination.  If the procedure report does not answer your questions, please call your gastroenterologist to clarify.  If you requested that your care partner not be given the details of your procedure findings, then the procedure report has been included in a sealed envelope for you to review at your convenience later.  YOU SHOULD EXPECT: Some feelings of bloating in the abdomen. Passage of more gas than usual.  Walking can help get rid of the air that was put into your GI tract during the procedure and reduce the bloating. If you had a lower endoscopy (such as a colonoscopy or flexible sigmoidoscopy) you may notice spotting of blood in your stool or on the toilet paper. If you underwent a bowel prep for your procedure, you may not have a normal bowel movement for a few days.  Please Note:  You might notice some irritation and congestion in your nose or some drainage.  This is from the oxygen used during your procedure.  There is no need for concern and it should clear up in a day or so.  SYMPTOMS TO REPORT IMMEDIATELY:   Following lower endoscopy (colonoscopy or flexible sigmoidoscopy):  Excessive amounts of blood in the stool  Significant tenderness or worsening of abdominal pains  Swelling of the abdomen that is new, acute  Fever of 100F or higher   For urgent or emergent issues, a gastroenterologist can be reached at any hour by calling [phone].   DIET:  We do recommend a small meal at first, but then you may proceed to your regular diet.  Drink plenty of fluids but you should avoid alcoholic beverages for 24 hours.  ACTIVITY:  You should plan to take it easy  for the rest of today and you should NOT DRIVE or use heavy machinery until tomorrow (because of the sedation medicines used during the test).    FOLLOW UP: Our staff will call the number listed on your records the next business day following your procedure to check on you and address any questions or concerns that you may have regarding the information given to you following your procedure. If we do not reach you, we will leave a message.  However, if you are feeling well and you are not experiencing any problems, there is no need to return our call.  We will assume that you have returned to your regular daily activities without incident.  If any biopsies were taken you will be contacted by phone or by letter within the next 1-3 weeks.  Please call us at [phone] if you have not heard about the biopsies in 3 weeks.    SIGNATURES/CONFIDENTIALITY: You and/or your care partner have signed paperwork which will be entered into your electronic medical record.  These signatures attest to the fact that that the information above on your After Visit Summary has been reviewed and is understood.  Full responsibility of the confidentiality of this discharge information lies with you and/or your care-partner.

## 2017-07-04 NOTE — Progress Notes (Signed)
Called to room to assist during endoscopic procedure.  Patient ID and intended procedure confirmed with present staff. Received instructions for my participation in the procedure from the performing physician.  

## 2017-07-04 NOTE — Progress Notes (Signed)
Pt's states no medical or surgical changes since previsit or office visit. 

## 2017-07-04 NOTE — Progress Notes (Signed)
Report to PACU, RN, vss, BBS= Clear.  

## 2017-07-04 NOTE — Op Note (Signed)
[facility] Patient Name: Cindy [name] Procedure Date: 07/04/2017 9:51 AM MRN: [id number] Endoscopist: Mauri Pole , MD Age: 71 Referring MD:  Date of Birth: 1946-07-29 Gender: Female Account #: [id number] Procedure:                Colonoscopy Indications:              High risk colon cancer surveillance: Personal                            history of colonic polyps, High risk colon cancer                            surveillance: Personal history of multiple (3 or                            more) adenomas Medicines:                Monitored Anesthesia Care Procedure:                Pre-Anesthesia Assessment:                           - Prior to the procedure, a History and Physical                            was performed, and patient medications and                            allergies were reviewed. The patient's tolerance of                            previous anesthesia was also reviewed. The risks                            and benefits of the procedure and the sedation                            options and risks were discussed with the patient.                            All questions were answered, and informed consent                            was obtained. Prior Anticoagulants: The patient has                            taken no previous anticoagulant or antiplatelet                            agents. ASA Grade Assessment: II - A patient with                            mild systemic disease. After reviewing the risks  and benefits, the patient was deemed in                            satisfactory condition to undergo the procedure.                           After obtaining informed consent, the colonoscope                            was passed under direct vision. Throughout the                            procedure, the patient's blood pressure, pulse, and                            oxygen saturations were monitored  continuously. The                            Colonoscope was introduced through the anus and                            advanced to the the terminal ileum, with                            identification of the appendiceal orifice and IC                            valve. The colonoscopy was technically difficult                            and complex due to multiple diverticula in the                            colon and restricted mobility of the colon. The                            patient tolerated the procedure well. The quality                            of the bowel preparation was good. The terminal                            ileum, ileocecal valve, appendiceal orifice, and                            rectum were photographed. Scope In: 9:58:12 AM Scope Out: 10:17:01 AM Scope Withdrawal Time: 0 hours 7 minutes 27 seconds  Total Procedure Duration: 0 hours 18 minutes 49 seconds  Findings:                 The perianal and digital rectal examinations were                            normal.  A 6 mm polyp was found in the transverse colon. The                            polyp was sessile. The polyp was removed with a                            cold snare. Resection and retrieval were complete.                           A 2 mm polyp was found in the transverse colon. The                            polyp was sessile. The polyp was removed with a                            cold biopsy forceps. Resection and retrieval were                            complete.                           Multiple small and large-mouthed diverticula were                            found in the sigmoid colon and descending colon.                            There was narrowing of the colon in association                            with the diverticular opening. There was evidence                            of diverticular spasm. Peri-diverticular erythema                            was  seen. There was no evidence of diverticular                            bleeding.                           Non-bleeding internal hemorrhoids were found during                            retroflexion. The hemorrhoids were small. Complications:            No immediate complications. Estimated Blood Loss:     Estimated blood loss was minimal. Impression:               - One 6 mm polyp in the transverse colon, removed                            with a cold snare. Resected and retrieved.                           -  One 2 mm polyp in the transverse colon, removed                            with a cold biopsy forceps. Resected and retrieved.                           - Severe diverticulosis in the sigmoid colon and in                            the descending colon. There was narrowing of the                            colon in association with the diverticular opening.                            There was evidence of diverticular spasm.                            Peri-diverticular erythema was seen. There was no                            evidence of diverticular bleeding.                           - Non-bleeding internal hemorrhoids. Recommendation:           - Patient has a contact number available for                            emergencies. The signs and symptoms of potential                            delayed complications were discussed with the                            patient. Return to normal activities tomorrow.                            Written discharge instructions were provided to the                            patient.                           - Resume previous diet.                           - Continue present medications.                           - Await pathology results.                           - Repeat colonoscopy in 5 years for surveillance  based on pathology results. Mauri Pole, MD 07/04/2017 10:21:56 AM This report has been  signed electronically.

## 2017-07-07 ENCOUNTER — Telehealth: Payer: Self-pay

## 2017-07-07 NOTE — Telephone Encounter (Signed)
  Follow up Call-  Call back number 07/04/2017  Post procedure Call Back phone  # [phone]  Permission to leave phone message Yes  Some recent data might be hidden     Patient questions:  Do you have a fever, pain , or abdominal swelling? No. Pain Score  0 *  Have you tolerated food without any problems? Yes.    Have you been able to return to your normal activities? Yes.    Do you have any questions about your discharge instructions: Diet   No. Medications  No. Follow up visit  No.  Do you have questions or concerns about your Care? No.  Actions: * If pain score is 4 or above: No action needed, pain <4.

## 2017-07-14 ENCOUNTER — Encounter: Payer: Self-pay | Admitting: Gastroenterology

## 2017-09-18 ENCOUNTER — Ambulatory Visit (INDEPENDENT_AMBULATORY_CARE_PROVIDER_SITE_OTHER): Payer: Medicare Other | Admitting: Gastroenterology

## 2017-09-18 ENCOUNTER — Encounter: Payer: Self-pay | Admitting: Gastroenterology

## 2017-09-18 VITALS — BP 118/74 | HR 70 | Ht 60.0 in | Wt 139.5 lb

## 2017-09-18 DIAGNOSIS — R197 Diarrhea, unspecified: Secondary | ICD-10-CM

## 2017-09-18 DIAGNOSIS — E739 Lactose intolerance, unspecified: Secondary | ICD-10-CM | POA: Diagnosis not present

## 2017-09-18 DIAGNOSIS — D126 Benign neoplasm of colon, unspecified: Secondary | ICD-10-CM

## 2017-09-18 MED ORDER — DIPHENOXYLATE-ATROPINE 2.5-0.025 MG PO TABS: 1.0000 | ORAL_TABLET | Freq: Every day | ORAL | 1 refills | Status: DC

## 2017-09-18 NOTE — Patient Instructions (Signed)
Use OTC Lactacid tablets as needed  Use Imodium 1-2 tablets as needed daily  We are giving you a printed prescription of Lomotil  Follow up as needed  Lactose-Free Diet, Adult If you have lactose intolerance, you are not able to digest lactose. Lactose is a natural sugar found mainly in milk and milk products. You may need to avoid all foods and beverages that contain lactose. A lactose-free diet can help you do this. What do I need to know about this diet?  Do not consume foods, beverages, vitamins, minerals, or medicines with lactose. Read ingredients lists carefully.  Look for the words "lactose-free" on labels.  Use lactase enzyme drops or tablets as directed by your health care provider.  Use lactose-free milk or a milk alternative, such as soy milk, for drinking and cooking.  Make sure you get enough calcium and vitamin D in your diet. A lactose-free eating plan can be lacking in these important nutrients.  Take calcium and vitamin D supplements as directed by your health care provider. Talk to your provider about supplements if you are not able to get enough calcium and vitamin D from food. Which foods have lactose? Lactose is found in:  Milk and foods made from milk.  Yogurt.  Cheese.  Butter.  Margarine.  Sour cream.  Cream.  Whipped toppings and nondairy creamers.  Ice cream and other milk-based desserts.  Lactose is also found in foods or products made with milk or milk ingredients. To find out whether a food contains milk or a milk ingredient, look at the ingredients list. Avoid foods with the statement "May contain milk" and foods that contain:  Butter.  Cream.  Milk.  Milk solids.  Milk powder.  Whey.  Curd.  Caseinate.  Lactose.  Lactalbumin.  Lactoglobulin.  What are some alternatives to milk and foods made with milk products?  Lactose-free milk.  Soy milk with added calcium and vitamin D.  Almond, coconut, or rice milk with  added calcium and vitamin D. Note that these are low in protein.  Soy products, such as soy yogurt, soy cheese, soy ice cream, and soy-based sour cream. Which foods can I eat? Grains Breads and rolls made without milk, such as [place], [place], or [place] bread, bagels, pita, and [employer]. Corn tortillas, corn meal, grits, and polenta. Crackers without lactose or milk solids, such as soda crackers and graham crackers. Cooked or dry cereals without lactose or milk solids. Pasta, quinoa, couscous, barley, oats, bulgur, farro, rice, wild rice, or other grains prepared without milk or lactose. Plain popcorn. Vegetables Fresh, frozen, and canned vegetables without cheese, cream, or butter sauces. Fruits All fresh, canned, frozen, or dried fruits that are not processed with lactose. Meats and Other Protein Sources Plain beef, chicken, fish, [place], lamb, veal, pork, wild game, or ham. Kosher-prepared meat products. Strained or junior meats that do not contain milk. Eggs. Soy meat substitutes. Beans, lentils, and hummus. Tofu. Nuts and seeds. Peanut or other nut butters without lactose. Soups, casseroles, and mixed dishes without cheese, cream, or milk. Dairy Lactose-free milk. Soy, rice, or almond milk with added calcium and vitamin D. Soy cheese and yogurt. Beverages Carbonated drinks. Tea. Coffee, freeze-dried coffee, and some instant coffees. Fruit and vegetable juices. Condiments Soy sauce. Carob powder. Olives. Gravy made with water. Baker's cocoa. Angie Fava. Pure seasonings and spices. Ketchup. Mustard. Bouillon. Broth. Sweets and Desserts Water and fruit ices. Gelatin. Cookies, pies, or cakes made from allowed ingredients, such as angel food cake. Pudding  made with water or a milk substitute. Lactose-free tofu desserts. Soy, coconut milk, or rice-milk-based frozen desserts. Sugar. Honey. Jam, jelly, and marmalade. Molasses. Pure sugar candy. Dark chocolate without milk. Marshmallows. Fats and  Oils Margarines and salad dressings that do not contain milk. Berniece Salines. Vegetable oils. Shortening. Mayonnaise. Soy or coconut-based cream. The items listed above may not be a complete list of recommended foods or beverages. Contact your dietitian for more options. Which foods are not recommended? Grains Breads and rolls that contain milk. Toaster pastries. Muffins, biscuits, waffles, cornbread, and pancakes. These can be prepared at home, commercial, or from mixes. Sweet rolls, donuts, English muffins, fry bread, lefse, flour tortillas with lactose, or [place] toast made with milk or milk ingredients. Crackers that contain lactose. Corn curls. Cooked or dry cereals with lactose. Vegetables Creamed or breaded vegetables. Vegetables in a cheese or butter sauce or with lactose-containing margarines. Instant potatoes. [place] fries. Scalloped or au gratin potatoes. Fruits None. Meats and Other Protein Sources Scrambled eggs, omelets, and souffles that contain milk. Creamed or breaded meat, fish, chicken, or [place]. Sausage products, such as wieners and liver sausage. Cold cuts that contain milk solids. Cheese, cottage cheese, ricotta cheese, and cheese spreads. Lasagna and macaroni and cheese. Pizza. Peanut or other nut butters with added milk solids. Casseroles or mixed dishes containing milk or cheese. Dairy All dairy products, including milk, goat's milk, buttermilk, kefir, acidophilus milk, flavored milk, evaporated milk, condensed milk, dulce de [place], eggnog, yogurt, cheese, and cheese spreads. Beverages Hot chocolate. Cocoa with lactose. Instant iced teas. Powdered fruit drinks. Smoothies made with milk or yogurt. Condiments Chewing gum that has lactose. Cocoa that has lactose. Spice blends if they contain milk products. Artificial sweeteners that contain lactose. Nondairy creamers. Sweets and Desserts Ice cream, ice milk, gelato, sherbet, and frozen yogurt. Custard, pudding, and mousse. Cake,  cream pies, cookies, and other desserts containing milk, cream, cream cheese, or milk chocolate. Pie crust made with milk-containing margarine or butter. Reduced-calorie desserts made with a sugar substitute that contains lactose. Toffee and butterscotch. Milk, white, or dark chocolate that contains milk. Fudge. Caramel. Fats and Oils Margarines and salad dressings that contain milk or cheese. Cream. Half and half. Cream cheese. Sour cream. Chip dips made with sour cream or yogurt. The items listed above may not be a complete list of foods and beverages to avoid. Contact your dietitian for more information. Am I getting enough calcium? Calcium is found in many foods that contain lactose and is important for bone health. The amount of calcium you need depends on your age:  Adults younger than 50 years: 1000 mg of calcium a day.  Adults older than 50 years: 1200 mg of calcium a day.  If you are not getting enough calcium, other calcium sources include:  Orange juice with calcium added. There are 300-350 mg of calcium in 1 cup of orange juice.  Sardines with edible bones. There are 325 mg of calcium in 3 oz of sardines.  Calcium-fortified soy milk. There are 300-400 mg of calcium in 1 cup of calcium-fortified soy milk.  Calcium-fortified rice or almond milk. There are 300 mg of calcium in 1 cup of calcium-fortified rice or almond milk.  Canned salmon with edible bones. There are 180 mg of calcium in 3 oz of canned salmon with edible bones.  Calcium-fortified breakfast cereals. There are [phone] mg of calcium in calcium-fortified breakfast cereals.  Tofu set with calcium sulfate. There are 250 mg of calcium in  cup of tofu set with calcium sulfate.  Spinach, cooked. There are 145 mg of calcium in  cup of cooked spinach.  Edamame, cooked. There are 130 mg of calcium in  cup of cooked edamame.  Collard greens, cooked. There are 125 mg of calcium in  cup of cooked collard  greens.  Kale, frozen or cooked. There are 90 mg of calcium in  cup of cooked or frozen kale.  Almonds. There are 95 mg of calcium in  cup of almonds.  Broccoli, cooked. There are 60 mg of calcium in 1 cup of cooked broccoli.  This information is not intended to replace advice given to you by your health care provider. Make sure you discuss any questions you have with your health care provider. Document Released: 02/01/2002 Document Revised: 01/18/2016 Document Reviewed: 11/12/2013 Elsevier Interactive Patient Education  2018 [employer].

## 2017-09-18 NOTE — Progress Notes (Signed)
Cohen Doleman    [id number]    1945-12-31  Primary Care Physician:Tower, Wynelle Fanny, MD  Referring Physician: Tower, Wynelle Fanny, MD [place], [place] [address]  Chief complaint: Diarrhea  HPI: 72 year old female here for follow-up visit with complaints of intermittent diarrhea.  She has episodes of diarrhea mostly when she eats out or when she is traveling.  She does not know if she has any specific food intolerance.  She avoids milk or ice cream as it causes increased bloating and diarrhea.  She does not avoid cream or cheese.  She has 1-2 bowel movements on most days.  Denies any abdominal pain, nausea, dysphagia, vomiting, blood per rectum or melena.  No change in weight.  No recent antibiotic use or dietary changes.  She is planning to travel to [place], next month and is worried about having potential episodes of diarrhea while she is there. Colonoscopy November 2018 with removal of small sessile polyps (tubular adenoma), left-sided severe diverticulosis and internal hemorrhoids   Outpatient Encounter Medications as of 09/18/2017  Medication Sig  . atenolol (TENORMIN) 25 MG tablet Take 1 tablet (25 mg total) by mouth daily.  Marland Kitchen atorvastatin (LIPITOR) 20 MG tablet Take 1 tablet (20 mg total) by mouth daily.  . Cholecalciferol (VITAMIN D-3) 1000 units CAPS Take 1 capsule by mouth daily.  Marland Kitchen lisinopril (PRINIVIL,ZESTRIL) 20 MG tablet Take 1 tablet (20 mg total) by mouth daily.  . Multiple Vitamins-Minerals (CENTRUM PO) Take 1 tablet by mouth daily.  Marland Kitchen triamcinolone cream (KENALOG) 0.1 % Apply 1 application topically 2 (two) times daily as needed.   Facility-Administered Encounter Medications as of 09/18/2017  Medication  . 0.9 %  sodium chloride infusion    Allergies as of 09/18/2017 - Review Complete 09/18/2017  Allergen Reaction Noted  . Ampicillin  07/27/2009  . Calcium  09/25/2009  . Cephalexin  07/27/2009    Past Medical History:  Diagnosis Date    . Benign neoplasm of colon   . MVP (mitral valve prolapse)    very mild  . Other abnormal glucose   . Other and unspecified hyperlipidemia   . Personal history of other malignant neoplasm of skin   . Unspecified essential hypertension     Past Surgical History:  Procedure Laterality Date  . CARDIAC CATHETERIZATION  1995   normal (after abnormal stress test)  . COLONOSCOPY  07/01/03   polyps  . 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 Macular degeneration Mother   . Sudden death Father 20  . Hyperlipidemia Brother   . Coronary artery disease Brother        CABG  . Hypertension Brother   . Colon cancer Neg Hx   . Esophageal cancer Neg Hx   . Rectal cancer Neg Hx   . Stomach cancer Neg Hx     Social History   Socioeconomic History  . Marital status: Widowed    Spouse name: Not on file  . Number of children: Not on file  . Years of education: Not on file  . Highest education level: Not on file  Social Needs  . Financial resource strain: Not on file  . Food insecurity - worry: Not on file  . Food insecurity - inability: Not on file  . Transportation needs - medical: Not on file  . Transportation needs - non-medical:  Not on file  Occupational History  . Occupation: retired  Tobacco Use  . Smoking status: Never Smoker  . Smokeless tobacco: Never Used  Substance and Sexual Activity  . Alcohol use: Yes    Alcohol/week: 4.2 - 8.4 oz    Types: 7 - 14 Shots of liquor per week    Comment: Regular  . Drug use: No  . Sexual activity: No  Other Topics Concern  . Not on file  Social History Narrative   Retired-med tech      Married-husband has advanced parkinson's      No regular exercise      Review of systems: Review of Systems  Constitutional: Negative for fever and chills.  HENT: Negative.   Eyes: Negative for blurred vision.  Respiratory: Negative for cough, shortness of breath and wheezing.    Cardiovascular: Negative for chest pain and palpitations.  Gastrointestinal: as per HPI Genitourinary: Negative for dysuria, urgency, frequency and hematuria.  Musculoskeletal: Negative for myalgias, back pain and positive joint pain.  Skin: Negative for itching and rash.  Neurological: Negative for dizziness, tremors, focal weakness, seizures and loss of consciousness.  Endo/Heme/Allergies: Negative for seasonal allergies.  Psychiatric/Behavioral: Negative for depression, suicidal ideas and hallucinations.  All other systems reviewed and are negative.   Physical Exam: Vitals:   09/18/17 1019  BP: 118/74  Pulse: 70   Body mass index is 27.24 kg/m. Gen:      No acute distress HEENT:  EOMI, sclera anicteric Neck:     No masses; no thyromegaly Lungs:    Clear to auscultation bilaterally; normal respiratory effort CV:         Regular rate and rhythm; no murmurs Abd:      + bowel sounds; soft, non-tender; no palpable masses, no distension Ext:    No edema; adequate peripheral perfusion Skin:      Warm and dry; no rash Neuro: alert and oriented x 3 Psych: normal mood and affect  Data Reviewed:  Reviewed labs, radiology imaging, old records and pertinent past GI work up   Assessment and Plan/Recommendations:  72 year old female with history of hyperlipidemia, hypertension with complaints of intermittent diarrhea Patient does have symptoms suggestive of lactose intolerance Advised her to avoid lactose Okay to use Imodium or Lomotil as needed for occasional diarrhea when she is traveling Due for recall surveillance colonoscopy due to history of adenomatous colon polyps in 5 years, November 2023 Return as needed  15 minutes was spent face-to-face with the patient. Greater than 50% of the time used for counseling as well as treatment plan and follow-up. She had multiple questions which were answered to her satisfaction  K. Denzil Magnuson , MD [phone] Mon-Fri 8a-5p [phone]  after 5p, weekends, holidays  CC: Tower, Wynelle Fanny, MD

## 2017-09-23 ENCOUNTER — Other Ambulatory Visit: Payer: Self-pay | Admitting: Family Medicine

## 2017-09-23 DIAGNOSIS — Z1231 Encounter for screening mammogram for malignant neoplasm of breast: Secondary | ICD-10-CM

## 2017-10-21 ENCOUNTER — Ambulatory Visit
Admission: RE | Admit: 2017-10-21 | Discharge: 2017-10-21 | Disposition: A | Discharge status: Home / Self Care | Payer: Medicare Other | Source: Ambulatory Visit | Attending: Family Medicine | Admitting: Family Medicine

## 2017-10-21 DIAGNOSIS — Z1231 Encounter for screening mammogram for malignant neoplasm of breast: Secondary | ICD-10-CM | POA: Diagnosis not present

## 2018-03-15 ENCOUNTER — Telehealth: Payer: Self-pay | Admitting: Family Medicine

## 2018-03-15 DIAGNOSIS — I10 Essential (primary) hypertension: Secondary | ICD-10-CM

## 2018-03-15 DIAGNOSIS — E7800 Pure hypercholesterolemia, unspecified: Secondary | ICD-10-CM

## 2018-03-15 DIAGNOSIS — R739 Hyperglycemia, unspecified: Secondary | ICD-10-CM

## 2018-03-15 NOTE — Telephone Encounter (Signed)
-----   Message from Eustace Pen, LPN sent at 02/22/4764  3:21 PM EDT ----- Regarding: Labs 7/22 Lab orders needed. Thank you.  Insurance:  [employer]

## 2018-03-16 ENCOUNTER — Ambulatory Visit (INDEPENDENT_AMBULATORY_CARE_PROVIDER_SITE_OTHER): Payer: Medicare Other

## 2018-03-16 ENCOUNTER — Ambulatory Visit (INDEPENDENT_AMBULATORY_CARE_PROVIDER_SITE_OTHER): Payer: Medicare Other | Admitting: Family Medicine

## 2018-03-16 ENCOUNTER — Encounter: Payer: Self-pay | Admitting: Family Medicine

## 2018-03-16 VITALS — BP 130/80 | HR 58 | Temp 98.4°F | Ht 59.5 in | Wt 138.5 lb

## 2018-03-16 DIAGNOSIS — E7800 Pure hypercholesterolemia, unspecified: Secondary | ICD-10-CM | POA: Diagnosis not present

## 2018-03-16 DIAGNOSIS — Z1211 Encounter for screening for malignant neoplasm of colon: Secondary | ICD-10-CM | POA: Diagnosis not present

## 2018-03-16 DIAGNOSIS — Z0000 Encounter for general adult medical examination without abnormal findings: Secondary | ICD-10-CM | POA: Diagnosis not present

## 2018-03-16 DIAGNOSIS — M25551 Pain in right hip: Secondary | ICD-10-CM

## 2018-03-16 DIAGNOSIS — I10 Essential (primary) hypertension: Secondary | ICD-10-CM

## 2018-03-16 DIAGNOSIS — R739 Hyperglycemia, unspecified: Secondary | ICD-10-CM | POA: Diagnosis not present

## 2018-03-16 DIAGNOSIS — M8580 Other specified disorders of bone density and structure, unspecified site: Secondary | ICD-10-CM | POA: Diagnosis not present

## 2018-03-16 LAB — LIPID PANEL
CHOL/HDL RATIO: 3
Cholesterol: 194 mg/dL (ref 0–200)
HDL: 57.1 mg/dL (ref >39.00)
NonHDL: 136.79
TRIGLYCERIDES: 247 mg/dL — AB (ref 0.0–149.0)
VLDL: 49.4 mg/dL — ABNORMAL HIGH (ref 0.0–40.0)

## 2018-03-16 LAB — CBC WITH DIFFERENTIAL/PLATELET
Basophils Absolute: 0.1 10*3/uL (ref 0.0–0.1)
Basophils Relative: 0.9 % (ref 0.0–3.0)
Eosinophils Absolute: 0.1 10*3/uL (ref 0.0–0.7)
Eosinophils Relative: 1 % (ref 0.0–5.0)
HCT: 40.1 % (ref 36.0–46.0)
Hemoglobin: 13.5 g/dL (ref 12.0–15.0)
LYMPHS ABS: 2 10*3/uL (ref 0.7–4.0)
Lymphocytes Relative: 32 % (ref 12.0–46.0)
MCHC: 33.6 g/dL (ref 30.0–36.0)
MCV: 94.5 fl (ref 78.0–100.0)
Monocytes Absolute: 0.7 10*3/uL (ref 0.1–1.0)
Monocytes Relative: 11.3 % (ref 3.0–12.0)
Neutro Abs: 3.4 10*3/uL (ref 1.4–7.7)
Neutrophils Relative %: 54.8 % (ref 43.0–77.0)
Platelets: 286 10*3/uL (ref 150.0–400.0)
RBC: 4.25 Mil/uL (ref 3.87–5.11)
RDW: 14.5 % (ref 11.5–15.5)
WBC: 6.3 10*3/uL (ref 4.0–10.5)

## 2018-03-16 LAB — COMPREHENSIVE METABOLIC PANEL
ALK PHOS: 73 U/L (ref 39–117)
ALT: 20 U/L (ref 0–35)
AST: 21 U/L (ref 0–37)
Albumin: 4.3 g/dL (ref 3.5–5.2)
BUN: 18 mg/dL (ref 6–23)
CO2: 26 mEq/L (ref 19–32)
CREATININE: 0.96 mg/dL (ref 0.40–1.20)
Calcium: 9.9 mg/dL (ref 8.4–10.5)
Chloride: 101 mEq/L (ref 96–112)
GFR: 60.62 mL/min (ref >60.00)
GLUCOSE: 120 mg/dL — AB (ref 70–99)
Potassium: 4.4 mEq/L (ref 3.5–5.1)
SODIUM: 136 meq/L (ref 135–145)
TOTAL PROTEIN: 7.9 g/dL (ref 6.0–8.3)
Total Bilirubin: 0.5 mg/dL (ref 0.2–1.2)

## 2018-03-16 LAB — TSH: TSH: 2.8 u[IU]/mL (ref 0.35–4.50)

## 2018-03-16 LAB — HEMOGLOBIN A1C: HEMOGLOBIN A1C: 6.4 % (ref 4.6–6.5)

## 2018-03-16 LAB — LDL CHOLESTEROL, DIRECT: Direct LDL: 106 mg/dL

## 2018-03-16 MED ORDER — ATORVASTATIN CALCIUM 20 MG PO TABS: 20.0000 mg | ORAL_TABLET | Freq: Every day | ORAL | 3 refills | Status: DC

## 2018-03-16 MED ORDER — ATENOLOL 25 MG PO TABS: 25.0000 mg | ORAL_TABLET | Freq: Every day | ORAL | 3 refills | Status: DC

## 2018-03-16 MED ORDER — LISINOPRIL 20 MG PO TABS: 20.0000 mg | ORAL_TABLET | Freq: Every day | ORAL | 3 refills | Status: DC

## 2018-03-16 NOTE — Progress Notes (Signed)
PCP notes:   Health maintenance:  No gaps identified.   Abnormal screenings:   Fall risk - hx of single fall Fall Risk  03/16/2018 03/16/2018 03/11/2017 03/07/2016 02/28/2015  Falls in the past year? Yes Yes No No No  Comment - tripped in parking lot - - -  Number falls in past yr: 1 1 - - -  Injury with Fall? Yes Yes - - -   Patient concerns:   Right hip pain - Pain scale: 2/10.   Nurse concerns:  None  Next PCP appt:   03/16/18 @ 1130  I reviewed health advisor's note, was available for consultation, and agree with documentation and plan. Loura Pardon MD

## 2018-03-16 NOTE — Patient Instructions (Signed)
Ms. Haberl , Thank you for taking time to come for your Medicare Wellness Visit. I appreciate your ongoing commitment to your health goals. Please review the following plan we discussed and let me know if I can assist you in the future.   These are the goals we discussed: Goals    . Patient Stated     Starting 03/16/2018, I will continue to take medications as prescribed.         This is a list of the screening recommended for you and due dates:  Health Maintenance  Topic Date Due  . DEXA scan (bone density measurement)  09/27/2020*  . Tetanus Vaccine  09/28/2023*  . Flu Shot  03/26/2018  . Mammogram  10/22/2019  . Colon Cancer Screening  07/04/2022  .  Hepatitis C: One time screening is recommended by Center for Disease Control  (CDC) for  adults born from [birth year] through 1966.   Completed  . Pneumonia vaccines  Completed  *Topic was postponed. The date shown is not the original due date.   Preventive Care for Adults  A healthy lifestyle and preventive care can promote health and wellness. Preventive health guidelines for adults include the following key practices.  . A routine yearly physical is a good way to check with your health care provider about your health and preventive screening. It is a chance to share any concerns and updates on your health and to receive a thorough exam.  . Visit your dentist for a routine exam and preventive care every 6 months. Brush your teeth twice a day and floss once a day. Good oral hygiene prevents tooth decay and gum disease.  . The frequency of eye exams is based on your age, health, family medical history, use  of contact lenses, and other factors. Follow your health care provider's recommendations for frequency of eye exams.  . Eat a healthy diet. Foods like vegetables, fruits, whole grains, low-fat dairy products, and lean protein foods contain the nutrients you need without too many calories. Decrease your intake of foods high in solid  fats, added sugars, and salt. Eat the right amount of calories for you. Get information about a proper diet from your health care provider, if necessary.  . Regular physical exercise is one of the most important things you can do for your health. Most adults should get at least 150 minutes of moderate-intensity exercise (any activity that increases your heart rate and causes you to sweat) each week. In addition, most adults need muscle-strengthening exercises on 2 or more days a week.  Silver Sneakers may be a benefit available to you. To determine eligibility, you may visit the website: www.silversneakers.com or contact program at [phone] Mon-Fri between 8AM-8PM.   . Maintain a healthy weight. The body mass index (BMI) is a screening tool to identify possible weight problems. It provides an estimate of body fat based on height and weight. Your health care provider can find your BMI and can help you achieve or maintain a healthy weight.   For adults 20 years and older: ? A BMI below 18.5 is considered underweight. ? A BMI of 18.5 to 24.9 is normal. ? A BMI of 25 to 29.9 is considered overweight. ? A BMI of 30 and above is considered obese.   . Maintain normal blood lipids and cholesterol levels by exercising and minimizing your intake of saturated fat. Eat a balanced diet with plenty of fruit and vegetables. Blood tests for lipids and cholesterol should begin  at age 21 and be repeated every 5 years. If your lipid or cholesterol levels are high, you are over 50, or you are at high risk for heart disease, you may need your cholesterol levels checked more frequently. Ongoing high lipid and cholesterol levels should be treated with medicines if diet and exercise are not working.  . If you smoke, find out from your health care provider how to quit. If you do not use tobacco, please do not start.  . If you choose to drink alcohol, please do not consume more than 2 drinks per day. One drink is  considered to be 12 ounces (355 mL) of beer, 5 ounces (148 mL) of wine, or 1.5 ounces (44 mL) of liquor.  . If you are 78-55 years old, ask your health care provider if you should take aspirin to prevent strokes.  . Use sunscreen. Apply sunscreen liberally and repeatedly throughout the day. You should seek shade when your shadow is shorter than you. Protect yourself by wearing long sleeves, pants, a wide-brimmed hat, and sunglasses year round, whenever you are outdoors.  . Once a month, do a whole body skin exam, using a mirror to look at the skin on your back. Tell your health care provider of new moles, moles that have irregular borders, moles that are larger than a pencil eraser, or moles that have changed in shape or color.

## 2018-03-16 NOTE — Progress Notes (Signed)
Subjective:   Cindy [name] is a 72 y.o. female who presents for Medicare Annual (Subsequent) preventive examination.  Review of Systems: N/A Cardiac Risk Factors include: advanced age (>59men, >76 women);dyslipidemia;hypertension     Objective:     Vitals: BP 130/80 (BP Location: Right Arm, Patient Position: Sitting, Cuff Size: Normal)   Pulse (!) 58   Temp 98.4 F (36.9 C) (Oral)   Ht 4' 11.5" (1.511 m) Comment: no shoes  Wt 138 lb 8 oz (62.8 kg)   SpO2 97%   BMI 27.51 kg/m   Body mass index is 27.51 kg/m.  Advanced Directives 03/16/2018 07/04/2017 03/11/2017 03/07/2016  Does Patient Have a Medical Advance Directive? Yes Yes Yes Yes  Type of Paramedic of [place];Living will [place];Living will [place];Living will [place];Living will  Does patient want to make changes to medical advance directive? - - - No - Patient declined  Copy of [place] in Chart? No - copy requested - No - copy requested No - copy requested    Tobacco Social History   Tobacco Use  Smoking Status Never Smoker  Smokeless Tobacco Never Used     Counseling given: No   Clinical Intake:  Pre-visit preparation completed: Yes  Pain Score: 2      Nutritional Status: BMI 25 -29 Overweight Nutritional Risks: None Diabetes: No  How often do you need to have someone help you when you read instructions, pamphlets, or other written materials from your doctor or pharmacy?: 1 - Never What is the last grade level you completed in school?: Bachelors degree  Interpreter Needed?: No  Comments: pt is a widow and lives alone Information entered by :: LPinson, LPN  Past Medical History:  Diagnosis Date  . Benign neoplasm of colon   . MVP (mitral valve prolapse)    very mild  . Other abnormal glucose   . Other and unspecified hyperlipidemia   . Personal history of other malignant neoplasm of  skin   . Unspecified essential hypertension    Past Surgical History:  Procedure Laterality Date  . CARDIAC CATHETERIZATION  1995   normal (after abnormal stress test)  . COLONOSCOPY  07/01/03   polyps  . MOHS SURGERY  11/09   for basal cell lesion   Family History  Problem Relation Age of Onset  . Macular degeneration Mother   . Sudden death Father 33  . Hyperlipidemia Brother   . Coronary artery disease Brother        CABG  . Hypertension Brother   . Colon cancer Neg Hx   . Esophageal cancer Neg Hx   . Rectal cancer Neg Hx   . Stomach cancer Neg Hx    Social History   Socioeconomic History  . Marital status: Widowed    Spouse name: Not on file  . Number of children: Not on file  . Years of education: Not on file  . Highest education level: Not on file  Occupational History  . Occupation: retired  Scientific laboratory technician  . Financial resource strain: Not on file  . Food insecurity:    Worry: Not on file    Inability: Not on file  . Transportation needs:    Medical: Not on file    Non-medical: Not on file  Tobacco Use  . Smoking status: Never Smoker  . Smokeless tobacco: Never Used  Substance and Sexual Activity  . Alcohol use: Yes  Alcohol/week: 4.2 - 8.4 oz    Types: 7 - 14 Shots of liquor per week    Comment: Regular  . Drug use: No  . Sexual activity: Never  Lifestyle  . Physical activity:    Days per week: Not on file    Minutes per session: Not on file  . Stress: Not on file  Relationships  . Social connections:    Talks on phone: Not on file    Gets together: Not on file    Attends religious service: Not on file    Active member of club or organization: Not on file    Attends meetings of clubs or organizations: Not on file    Relationship status: Not on file  Other Topics Concern  . Not on file  Social History Narrative   Retired-med tech      Married-husband has advanced parkinson's      No regular exercise    Outpatient Encounter Medications  as of 03/16/2018  Medication Sig  . Cholecalciferol (VITAMIN D-3) 1000 units CAPS Take 1 capsule by mouth daily.  . Loperamide HCl (IMODIUM A-D PO) Take by mouth as needed.  . Multiple Vitamins-Minerals (CENTRUM PO) Take 1 tablet by mouth daily.  Marland Kitchen OVER THE COUNTER MEDICATION   . triamcinolone cream (KENALOG) 0.1 % Apply 1 application topically 2 (two) times daily as needed.  . [DISCONTINUED] atenolol (TENORMIN) 25 MG tablet Take 1 tablet (25 mg total) by mouth daily.  . [DISCONTINUED] atorvastatin (LIPITOR) 20 MG tablet Take 1 tablet (20 mg total) by mouth daily.  . [DISCONTINUED] lisinopril (PRINIVIL,ZESTRIL) 20 MG tablet Take 1 tablet (20 mg total) by mouth daily.  . [DISCONTINUED] diphenoxylate-atropine (LOMOTIL) 2.5-0.025 MG tablet Take 1 tablet by mouth daily. (Patient not taking: Reported on 03/16/2018)  . [DISCONTINUED] 0.9 %  sodium chloride infusion    No facility-administered encounter medications on file as of 03/16/2018.     Activities of Daily Living In your present state of health, do you have any difficulty performing the following activities: 03/16/2018  Hearing? N  Vision? N  Difficulty concentrating or making decisions? N  Walking or climbing stairs? N  Dressing or bathing? N  Doing errands, shopping? N  Preparing Food and eating ? N  Using the Toilet? N  In the past six months, have you accidently leaked urine? N  Do you have problems with loss of bowel control? N  Managing your Medications? N  Managing your Finances? N  Housekeeping or managing your Housekeeping? N  Some recent data might be hidden    Patient Care Team: Tower, Wynelle Fanny, MD as PCP - General Agapito Games as Referring Physician (Optometry) Druscilla Brownie, MD as Consulting Physician (Dermatology)    Assessment:   This is a routine wellness examination for Cindy [name].  Hearing Screening   125Hz  250Hz  500Hz  1000Hz  2000Hz  3000Hz  4000Hz  6000Hz  8000Hz   Right ear:   40 40 40  40    Left ear:    40 40 40  40    Vision Screening Comments: [facility] exam in March 2019 [facility]    Exercise Activities and Dietary recommendations Current Exercise Habits: The patient does not participate in regular exercise at present, Exercise limited by: None identified  Goals    . Patient Stated     Starting 03/16/2018, I will continue to take medications as prescribed.         Fall Risk Fall Risk  03/16/2018 03/16/2018 03/11/2017 03/07/2016 02/28/2015  Falls  in the past year? Yes Yes No No No  Comment - tripped in parking lot - - -  Number falls in past yr: 1 1 - - -  Injury with Fall? Yes Yes - - -   Depression Screen PHQ 2/9 Scores 03/16/2018 03/11/2017 03/07/2016 02/28/2015  PHQ - 2 Score 0 0 0 0  PHQ- 9 Score 0 - - -     Cognitive Function MMSE - Mini Mental State Exam 03/16/2018 03/11/2017 03/07/2016  Orientation to time 5 5 5   Orientation to Place 5 5 5   Registration 3 3 3   Attention/ Calculation 0 0 0  Recall 3 3 3   Language- name 2 objects 0 0 0  Language- repeat 1 1 1   Language- follow 3 step command 3 3 3   Language- read & follow direction 0 0 0  Write a sentence 0 0 0  Copy design 0 0 0  Total score 20 20 20      PLEASE NOTE: A Mini-Cog screen was completed. Maximum score is 20. A value of 0 denotes this part of Folstein MMSE was not completed or the patient failed this part of the Mini-Cog screening.   Mini-Cog Screening Orientation to Time - Max 5 pts Orientation to Place - Max 5 pts Registration - Max 3 pts Recall - Max 3 pts Language Repeat - Max 1 pts Language Follow 3 Step Command - Max 3 pts     Immunization History  Administered Date(s) Administered  . Hepatitis B 07/03/1988, 07/30/1988, 01/01/1989  . Influenza Split 06/04/2011  . Influenza Whole 04/26/2009, 05/26/2009, 05/28/2010, 06/02/2012  . Influenza-Unspecified 06/01/2013, 05/27/2014  . Pneumococcal Conjugate-13 01/12/2015  . Pneumococcal Polysaccharide-23 10/24/2010  . Td 08/26/2004  . Zoster  05/07/2013    Screening Tests Health Maintenance  Topic Date Due  . DEXA SCAN  09/27/2020 (Originally 08/10/2010)  . TETANUS/TDAP  09/28/2023 (Originally 08/26/2014)  . INFLUENZA VACCINE  03/26/2018  . MAMMOGRAM  10/22/2019  . COLONOSCOPY  07/04/2022  . Hepatitis C Screening  Completed  . PNA vac Low Risk Adult  Completed       Plan:     I have personally reviewed, addressed, and noted the following in the patient's chart:  A. Medical and social history B. Use of alcohol, tobacco or illicit drugs  C. Current medications and supplements D. Functional ability and status E.  Nutritional status F.  Physical activity G. Advance directives H. List of other physicians I.  Hospitalizations, surgeries, and ER visits in previous 12 months J.  [place] to include hearing, vision, cognitive, depression L. Referrals and appointments - none  In addition, I have reviewed and discussed with patient certain preventive protocols, quality metrics, and best practice recommendations. A written personalized care plan for preventive services as well as general preventive health recommendations were provided to patient.  See attached scanned questionnaire for additional information.   Signed,   Lindell Noe, MHA, BS, LPN Health Coach

## 2018-03-16 NOTE — Progress Notes (Signed)
Subjective:    Patient ID: Cindy [name], female    DOB: [birth year], 72 y.o.   MRN: [id number]  HPI Here for annual f/u of chronic medical problems   Had a good trip to [place]  Good summer   Feeling good overall   Still having hip problems - sees Dr Lorelei Pont  Bursitis  Cannot get rid of it  Interferes with ability to exercise  Also steps    Wt Readings from Last 3 Encounters:  03/16/18 138 lb 8 oz (62.8 kg)  03/16/18 138 lb 8 oz (62.8 kg)  09/18/17 139 lb 8 oz (63.3 kg)  stable weight  She used to go to the gym- not going now due to hip pain  Some walking  27.51 kg/m    Had amw today  No concerns Had one fall this year in a parking lot -she tripped on construction net in a gravel parking lot  Hit face- abrasions but was ok    dexa -declines  Hx of osteopenia  Takes D3 No fractures  One fall -see above   Mammogram 2/19 nl Self exam - no lumps or changes   Colonoscopy 11/18-adenoma - 5 y recall  Had a hard time getting rid of gas after the scope Also has diverticulosis   zostavax 9/14  bp is stable today  No cp or palpitations or headaches or edema  No side effects to medicines  BP Readings from Last 3 Encounters:  03/16/18 130/80  03/16/18 130/80  09/18/17 118/74   takes atenolol and lisinopril    Hyperglycemia  Lab Results  Component Value Date   HGBA1C 6.2 03/11/2017  does not expect changes  Ate a lot in [place] -pasta  Eats very healthy at home    Due for labs (done this am)   Also for cholesterol  Lab Results  Component Value Date   CHOL 197 03/11/2017   HDL 58.50 03/11/2017   LDLCALC 76 02/07/2014   LDLDIRECT 98.0 03/11/2017   TRIG 348.0 (H) 03/11/2017   CHOLHDL 3 03/11/2017  atorvastatin and diet   Patient Active Problem List   Diagnosis Date Noted  . Right hip pain 03/16/2018  . Colon cancer screening 03/11/2017  . Routine general medical examination at a health care facility 02/28/2015  . Encounter for Medicare annual  wellness exam 02/03/2013  . Osteopenia 02/03/2012  . Hyperglycemia 10/10/2010  . COLONIC POLYPS 03/05/2010  . Hyperlipidemia 07/27/2009  . Essential hypertension 07/27/2009  . BASAL CELL CARCINOMA, HX OF 07/27/2009   Past Medical History:  Diagnosis Date  . Benign neoplasm of colon   . MVP (mitral valve prolapse)    very mild  . Other abnormal glucose   . Other and unspecified hyperlipidemia   . Personal history of other malignant neoplasm of skin   . Unspecified essential hypertension    Past Surgical History:  Procedure Laterality Date  . CARDIAC CATHETERIZATION  1995   normal (after abnormal stress test)  . COLONOSCOPY  07/01/03   polyps  . MOHS SURGERY  11/09   for basal cell lesion   Social History   Tobacco Use  . Smoking status: Never Smoker  . Smokeless tobacco: Never Used  Substance Use Topics  . Alcohol use: Yes    Alcohol/week: 4.2 - 8.4 oz    Types: 7 - 14 Shots of liquor per week    Comment: Regular  . Drug use: No   Family History  Problem Relation Age of Onset  .  Macular degeneration Mother   . Sudden death Father 4  . Hyperlipidemia Brother   . Coronary artery disease Brother        CABG  . Hypertension Brother   . Colon cancer Neg Hx   . Esophageal cancer Neg Hx   . Rectal cancer Neg Hx   . Stomach cancer Neg Hx    Allergies  Allergen Reactions  . Ampicillin     REACTION: rash  . Calcium     REACTION: GI intolerance  . Cephalexin     REACTION: rash   Current Outpatient Medications on File Prior to Visit  Medication Sig Dispense Refill  . Cholecalciferol (VITAMIN D-3) 1000 units CAPS Take 1 capsule by mouth daily.    . Multiple Vitamins-Minerals (CENTRUM PO) Take 1 tablet by mouth daily.    Marland Kitchen triamcinolone cream (KENALOG) 0.1 % Apply 1 application topically 2 (two) times daily as needed.  2   No current facility-administered medications on file prior to visit.       Review of Systems  Constitutional: Negative for activity change,  appetite change, fatigue, fever and unexpected weight change.  HENT: Negative for congestion, ear pain, rhinorrhea, sinus pressure and sore throat.   Eyes: Negative for pain, redness and visual disturbance.  Respiratory: Negative for cough, shortness of breath and wheezing.   Cardiovascular: Negative for chest pain and palpitations.  Gastrointestinal: Negative for abdominal pain, blood in stool, constipation and diarrhea.  Endocrine: Negative for polydipsia and polyuria.  Genitourinary: Negative for dysuria, frequency and urgency.  Musculoskeletal: Negative for arthralgias, back pain and myalgias.       Ongoing R hip pain  Skin: Negative for pallor and rash.  Allergic/Immunologic: Negative for environmental allergies.  Neurological: Negative for dizziness, syncope and headaches.  Hematological: Negative for adenopathy. Does not bruise/bleed easily.  Psychiatric/Behavioral: Negative for decreased concentration and dysphoric mood. The patient is not nervous/anxious.        Objective:   Physical Exam  Constitutional: She appears well-developed and well-nourished. No distress.  overwt and well app  HENT:  Head: Normocephalic and atraumatic.  Right Ear: External ear normal.  Left Ear: External ear normal.  Mouth/Throat: Oropharynx is clear and moist.  Eyes: Pupils are equal, round, and reactive to light. Conjunctivae and EOM are normal. No scleral icterus.  Neck: Normal range of motion. Neck supple. No JVD present. Carotid bruit is not present. No thyromegaly present.  Cardiovascular: Normal rate, regular rhythm, normal heart sounds and intact distal pulses. Exam reveals no gallop.  Pulmonary/Chest: Effort normal and breath sounds normal. No respiratory distress. She has no wheezes. She has no rales. She exhibits no tenderness. No breast tenderness, discharge or bleeding.  Abdominal: Soft. Bowel sounds are normal. She exhibits no distension, no abdominal bruit and no mass. There is no  tenderness.  Genitourinary: No breast tenderness, discharge or bleeding.  Genitourinary Comments: Breast exam: No mass, nodules, thickening, tenderness, bulging, retraction, inflamation, nipple discharge or skin changes noted.  No axillary or clavicular LA.      Musculoskeletal: Normal range of motion. She exhibits no edema or tenderness.  Nl gait  Some limited R hip rom due to discomfort  Lymphadenopathy:    She has no cervical adenopathy.  Neurological: She is alert. She has normal reflexes. She displays normal reflexes. No cranial nerve deficit. She exhibits normal muscle tone. Coordination normal.  Skin: Skin is warm and dry. No rash noted. No erythema. No pallor.  Solar lentigines diffusely  Some sks  scattered  Fair complexion Varicosities in ankles  Psychiatric: She has a normal mood and affect. Cognition and memory are normal.  Pleasant and talkative           Assessment & Plan:   Problem List Items Addressed This Visit      Cardiovascular and Mediastinum   Essential hypertension - Primary    bp in fair control at this time  BP Readings from Last 1 Encounters:  03/16/18 130/80   No changes needed Most recent labs reviewed  Disc lifstyle change with low sodium diet and exercise        Relevant Medications   atenolol (TENORMIN) 25 MG tablet   atorvastatin (LIPITOR) 20 MG tablet   lisinopril (PRINIVIL,ZESTRIL) 20 MG tablet     Musculoskeletal and Integument   Osteopenia    Pt declines dexa One fall /no fractures On vit D Some walking          Other   Colon cancer screening    Colonoscopy 11/18 - with adenoma Recall 5 y  Pt unsure if she will do that       Hyperglycemia    Due for A1C Last one 6.2 Unsure how it will look  Eating healthy at home-but just had vacation  disc imp of low glycemic diet and wt loss to prevent DM2       Hyperlipidemia    Disc goals for lipids and reasons to control them Rev last labs with pt Rev low sat fat diet in  detail Labs today Continues atorvastatin and generally low fat diet       Relevant Medications   atenolol (TENORMIN) 25 MG tablet   atorvastatin (LIPITOR) 20 MG tablet   lisinopril (PRINIVIL,ZESTRIL) 20 MG tablet   Right hip pain    This is ongoing  Told it was bursitis in the past She req ortho eval- ref done       Relevant Orders   Ambulatory referral to Orthopedic Surgery

## 2018-03-16 NOTE — Patient Instructions (Signed)
Take care of yourself  We will refer you to orthopedics for your hip   Keep exercising as tolerated   Will update you when labs return   Eat a healthy diet Try to get most of your carbohydrates from produce (with the exception of white potatoes)  Eat less bread/pasta/rice/snack foods/cereals/sweets and other items from the middle of the grocery store (processed carbs)

## 2018-03-16 NOTE — Assessment & Plan Note (Signed)
Disc goals for lipids and reasons to control them Rev last labs with pt Rev low sat fat diet in detail Labs today Continues atorvastatin and generally low fat diet

## 2018-03-16 NOTE — Assessment & Plan Note (Signed)
Due for A1C Last one 6.2 Unsure how it will look  Eating healthy at home-but just had vacation  disc imp of low glycemic diet and wt loss to prevent DM2

## 2018-03-16 NOTE — Assessment & Plan Note (Signed)
bp in fair control at this time  BP Readings from Last 1 Encounters:  03/16/18 130/80   No changes needed Most recent labs reviewed  Disc lifstyle change with low sodium diet and exercise

## 2018-03-16 NOTE — Assessment & Plan Note (Signed)
Colonoscopy 11/18 - with adenoma Recall 5 y  Pt unsure if she will do that

## 2018-03-16 NOTE — Assessment & Plan Note (Signed)
Pt declines dexa One fall /no fractures On vit D Some walking

## 2018-03-16 NOTE — Assessment & Plan Note (Signed)
This is ongoing  Told it was bursitis in the past She req ortho eval- ref done

## 2018-04-10 DIAGNOSIS — S76011A Strain of muscle, fascia and tendon of right hip, initial encounter: Secondary | ICD-10-CM | POA: Diagnosis not present

## 2018-04-10 DIAGNOSIS — M7061 Trochanteric bursitis, right hip: Secondary | ICD-10-CM | POA: Diagnosis not present

## 2018-04-10 DIAGNOSIS — M1611 Unilateral primary osteoarthritis, right hip: Secondary | ICD-10-CM | POA: Diagnosis not present

## 2018-04-10 DIAGNOSIS — M7071 Other bursitis of hip, right hip: Secondary | ICD-10-CM | POA: Diagnosis not present

## 2018-04-21 DIAGNOSIS — M25551 Pain in right hip: Secondary | ICD-10-CM | POA: Diagnosis not present

## 2018-04-21 DIAGNOSIS — M2560 Stiffness of unspecified joint, not elsewhere classified: Secondary | ICD-10-CM | POA: Diagnosis not present

## 2018-04-21 DIAGNOSIS — M6281 Muscle weakness (generalized): Secondary | ICD-10-CM | POA: Diagnosis not present

## 2018-04-24 DIAGNOSIS — M25551 Pain in right hip: Secondary | ICD-10-CM | POA: Diagnosis not present

## 2018-04-29 DIAGNOSIS — M25551 Pain in right hip: Secondary | ICD-10-CM | POA: Diagnosis not present

## 2018-05-01 DIAGNOSIS — M25551 Pain in right hip: Secondary | ICD-10-CM | POA: Diagnosis not present

## 2018-05-05 DIAGNOSIS — M25551 Pain in right hip: Secondary | ICD-10-CM | POA: Diagnosis not present

## 2018-05-07 DIAGNOSIS — M25551 Pain in right hip: Secondary | ICD-10-CM | POA: Diagnosis not present

## 2018-05-13 DIAGNOSIS — M25551 Pain in right hip: Secondary | ICD-10-CM | POA: Diagnosis not present

## 2018-05-15 DIAGNOSIS — M25551 Pain in right hip: Secondary | ICD-10-CM | POA: Diagnosis not present

## 2018-05-19 DIAGNOSIS — M25551 Pain in right hip: Secondary | ICD-10-CM | POA: Diagnosis not present

## 2018-05-26 DIAGNOSIS — M25551 Pain in right hip: Secondary | ICD-10-CM | POA: Diagnosis not present

## 2018-06-17 DIAGNOSIS — Z23 Encounter for immunization: Secondary | ICD-10-CM | POA: Diagnosis not present

## 2018-06-24 DIAGNOSIS — M25551 Pain in right hip: Secondary | ICD-10-CM | POA: Diagnosis not present

## 2018-06-26 DIAGNOSIS — M25551 Pain in right hip: Secondary | ICD-10-CM | POA: Diagnosis not present

## 2018-10-05 DIAGNOSIS — D1801 Hemangioma of skin and subcutaneous tissue: Secondary | ICD-10-CM | POA: Diagnosis not present

## 2018-10-05 DIAGNOSIS — L570 Actinic keratosis: Secondary | ICD-10-CM | POA: Diagnosis not present

## 2018-10-05 DIAGNOSIS — L821 Other seborrheic keratosis: Secondary | ICD-10-CM | POA: Diagnosis not present

## 2018-10-05 DIAGNOSIS — D225 Melanocytic nevi of trunk: Secondary | ICD-10-CM | POA: Diagnosis not present

## 2018-10-05 DIAGNOSIS — L814 Other melanin hyperpigmentation: Secondary | ICD-10-CM | POA: Diagnosis not present

## 2018-10-05 DIAGNOSIS — Z85828 Personal history of other malignant neoplasm of skin: Secondary | ICD-10-CM | POA: Diagnosis not present

## 2019-01-31 ENCOUNTER — Encounter: Payer: Self-pay | Admitting: Family Medicine

## 2019-02-01 NOTE — Telephone Encounter (Signed)
Patient scheduled for tomorrow

## 2019-02-02 ENCOUNTER — Encounter: Payer: Self-pay | Admitting: Family Medicine

## 2019-02-02 ENCOUNTER — Ambulatory Visit (INDEPENDENT_AMBULATORY_CARE_PROVIDER_SITE_OTHER): Payer: Medicare Other | Admitting: Family Medicine

## 2019-02-02 DIAGNOSIS — M1000 Idiopathic gout, unspecified site: Secondary | ICD-10-CM | POA: Diagnosis not present

## 2019-02-02 DIAGNOSIS — M109 Gout, unspecified: Secondary | ICD-10-CM | POA: Insufficient documentation

## 2019-02-02 MED ORDER — COLCHICINE 0.6 MG PO TABS: ORAL_TABLET | ORAL | 1 refills | Status: DC

## 2019-02-02 NOTE — Progress Notes (Signed)
Virtual Visit via Video Note  I connected with Cindy [name] on 02/02/19 at  3:45 PM EDT by a video enabled telemedicine application and verified that I am speaking with the correct person using two identifiers.  Location: Patient: home Provider: office    I discussed the limitations of evaluation and management by telemedicine and the availability of in person appointments. The patient expressed understanding and agreed to proceed.  History of Present Illness: Pt presents with great toe pain in R foot  Suspect gout   She had an episode in January Great toe pain -started while sleeping /very painful (could not stand the sheet to touch)  She treated with advil -it helped   This past weekend- Friday- uncomfortable Sat/sunday worse R great toe Swollen and red -could not walk well  Took advil and used ice  Starting to get better  Reviewed pictures she sent  Still swollen and sore   Brother has gout as well   She has read about diet changes  She has also cut alcohol   She did eat some cured meats prior - country ham  Also occ [place]   Lab Results  Component Value Date   CREATININE 0.96 03/16/2018   BUN 18 03/16/2018   NA 136 03/16/2018   K 4.4 03/16/2018   CL 101 03/16/2018   CO2 26 03/16/2018   Lab Results  Component Value Date   ALT 20 03/16/2018   AST 21 03/16/2018   ALKPHOS 73 03/16/2018   BILITOT 0.5 03/16/2018    Has chronic hip pain  No other joint swelling  No rashes  No tick bites or fevers    Review of Systems  Constitutional: Negative for chills, fever and malaise/fatigue.  Respiratory: Negative for cough and shortness of breath.   Cardiovascular: Negative for chest pain, palpitations and leg swelling.  Gastrointestinal: Negative for abdominal pain, nausea and vomiting.  Musculoskeletal: Positive for joint pain.  Skin: Negative for itching and rash.  Neurological: Negative for dizziness and weakness.    Patient Active Problem List   Diagnosis  Date Noted  . Gout 02/02/2019  . Right hip pain 03/16/2018  . Colon cancer screening 03/11/2017  . Routine general medical examination at a health care facility 02/28/2015  . Encounter for Medicare annual wellness exam 02/03/2013  . Osteopenia 02/03/2012  . Hyperglycemia 10/10/2010  . COLONIC POLYPS 03/05/2010  . Hyperlipidemia 07/27/2009  . Essential hypertension 07/27/2009  . BASAL CELL CARCINOMA, HX OF 07/27/2009   Past Medical History:  Diagnosis Date  . Benign neoplasm of colon   . MVP (mitral valve prolapse)    very mild  . Other abnormal glucose   . Other and unspecified hyperlipidemia   . Personal history of other malignant neoplasm of skin   . Unspecified essential hypertension    Past Surgical History:  Procedure Laterality Date  . CARDIAC CATHETERIZATION  1995   normal (after abnormal stress test)  . COLONOSCOPY  07/01/03   polyps  . MOHS SURGERY  11/09   for basal cell lesion   Social History   Tobacco Use  . Smoking status: Never Smoker  . Smokeless tobacco: Never Used  Substance Use Topics  . Alcohol use: Yes    Alcohol/week: 7.0 - 14.0 standard drinks    Types: 7 - 14 Shots of liquor per week    Comment: Regular  . Drug use: No   Family History  Problem Relation Age of Onset  . Macular degeneration Mother   .  Sudden death Father 44  . Hyperlipidemia Brother   . Coronary artery disease Brother        CABG  . Hypertension Brother   . Colon cancer Neg Hx   . Esophageal cancer Neg Hx   . Rectal cancer Neg Hx   . Stomach cancer Neg Hx    Allergies  Allergen Reactions  . Ampicillin     REACTION: rash  . Calcium     REACTION: GI intolerance  . Cephalexin     REACTION: rash   Current Outpatient Medications on File Prior to Visit  Medication Sig Dispense Refill  . atenolol (TENORMIN) 25 MG tablet Take 1 tablet (25 mg total) by mouth daily. 90 tablet 3  . atorvastatin (LIPITOR) 20 MG tablet Take 1 tablet (20 mg total) by mouth daily. 90 tablet  3  . Cholecalciferol (VITAMIN D-3) 1000 units CAPS Take 1 capsule by mouth daily.    Marland Kitchen lisinopril (PRINIVIL,ZESTRIL) 20 MG tablet Take 1 tablet (20 mg total) by mouth daily. 90 tablet 3  . Loperamide HCl (IMODIUM A-D PO) Take by mouth as needed.    Marland Kitchen OVER THE COUNTER MEDICATION     . triamcinolone cream (KENALOG) 0.1 % Apply 1 application topically 2 (two) times daily as needed.  2  . Multiple Vitamins-Minerals (CENTRUM PO) Take 1 tablet by mouth daily.     No current facility-administered medications on file prior to visit.     Observations/Objective: Patient appears well, in no distress Weight is baseline  No facial swelling or asymmetry Normal voice-not hoarse and no slurred speech No obvious tremor or mobility impairment Moving neck and UEs normally R great toe (MTP) joint is erythematous and mildly swollen (see picture under media), and pt is able to move it today No ecchymosis or signs of trauma  Able to hear the call well  No cough or shortness of breath during interview  Talkative and mentally sharp with no cognitive changes No skin changes on face or neck , no rash or pallor Affect is normal    Assessment and Plan: Problem List Items Addressed This Visit      Other   Gout - Primary    Right great toe  2nd flare since January - may have been caused by eating country ham  Disc low purine diet Disc good hydration Sent in colchicine for use acutely with food (watch for GI distress)  Will plan to check uric acid level /renal profile to see if she needs preventative medication  If worse or no improvement-also consider xray to look for erosive joint changes  Update if not starting to improve in a week or if worsening        Relevant Orders   Uric acid   Renal function panel       Follow Up Instructions: Take the colchicine as needed as directed for acute gout flare  Elevate foot/ice may help  Drink plenty of water Avoid high purine foods (like cured meats) and use  caution with alcohol  The office will call you to set up a lab appointment to check uric acid level    I discussed the assessment and treatment plan with the patient. The patient was provided an opportunity to ask questions and all were answered. The patient agreed with the plan and demonstrated an understanding of the instructions.   The patient was advised to call back or seek an in-person evaluation if the symptoms worsen or if the condition fails to  improve as anticipated.     Loura Pardon, MD

## 2019-02-02 NOTE — Patient Instructions (Addendum)
Take the colchicine as needed as directed for acute gout flare  Elevate foot/ice may help  Drink plenty of water Avoid high purine foods (like cured meats) and use caution with alcohol  The office will call you to set up a lab appointment to check uric acid level    Low-Purine Eating Plan A low-purine eating plan involves making food choices to limit your intake of purine. Purine is a kind of uric acid. Too much uric acid in your blood can cause certain conditions, such as gout and kidney stones. Eating a low-purine diet can help control these conditions. What are tips for following this plan? Reading food labels   Avoid foods with saturated or Trans fat.  Check the ingredient list of grains-based foods, such as bread and cereal, to make sure that they contain whole grains.  Check the ingredient list of sauces or soups to make sure they do not contain meat or fish.  When choosing soft drinks, check the ingredient list to make sure they do not contain high-fructose corn syrup. Shopping  Buy plenty of fresh fruits and vegetables.  Avoid buying canned or fresh fish.  Buy dairy products labeled as low-fat or nonfat.  Avoid buying premade or processed foods. These foods are often high in fat, salt (sodium), and added sugar. Cooking  Use olive oil instead of butter when cooking. Oils like olive oil, canola oil, and sunflower oil contain healthy fats. Meal planning  Learn which foods do or do not affect you. If you find out that a food tends to cause your gout symptoms to flare up, avoid eating that food. You can enjoy foods that do not cause problems. If you have any questions about a food item, talk with your dietitian or health care provider.  Limit foods high in fat, especially saturated fat. Fat makes it harder for your body to get rid of uric acid.  Choose foods that are lower in fat and are lean sources of protein. General guidelines  Limit alcohol intake to no more than 1  drink a day for nonpregnant women and 2 drinks a day for men. One drink equals 12 oz of beer, 5 oz of wine, or 1 oz of hard liquor. Alcohol can affect the way your body gets rid of uric acid.  Drink plenty of water to keep your urine clear or pale yellow. Fluids can help remove uric acid from your body.  If directed by your health care provider, take a vitamin C supplement.  Work with your health care provider and dietitian to develop a plan to achieve or maintain a healthy weight. Losing weight can help reduce uric acid in your blood. What foods are recommended? The items listed may not be a complete list. Talk with your dietitian about what dietary choices are best for you. Foods low in purines Foods low in purines do not need to be limited. These include:  All fruits.  All low-purine vegetables, pickles, and olives.  Breads, pasta, rice, cornbread, and popcorn. Cake and other baked goods.  All dairy foods.  Eggs, nuts, and nut butters.  Spices and condiments, such as salt, herbs, and vinegar.  Plant oils, butter, and margarine.  Water, sugar-free soft drinks, tea, coffee, and cocoa.  Vegetable-based soups, broths, sauces, and gravies. Foods moderate in purines Foods moderate in purines should be limited to the amounts listed.   cup of asparagus, cauliflower, spinach, mushrooms, or green peas, each day.  2/3 cup uncooked oatmeal, each day.  cup dry wheat bran or wheat germ, each day.  2-3 ounces of meat or poultry, each day.  4-6 ounces of shellfish, such as crab, lobster, oysters, or shrimp, each day.  1 cup cooked beans, peas, or lentils, each day.  Soup, broths, or bouillon made from meat or fish. Limit these foods as much as possible. What foods are not recommended? The items listed may not be a complete list. Talk with your dietitian about what dietary choices are best for you. Limit your intake of foods high in purines, including:  Beer and other  alcohol.  Meat-based gravy or sauce.  Canned or fresh fish, such as: ? Anchovies, sardines, herring, and tuna. ? Mussels and scallops. ? Codfish, trout, and haddock.  Berniece Salines.  Organ meats, such as: ? Liver or kidney. ? Tripe. ? Sweetbreads (thymus gland or pancreas).  Wild Clinical biochemist.  Yeast or yeast extract supplements.  Drinks sweetened with high-fructose corn syrup. Summary  Eating a low-purine diet can help control conditions caused by too much uric acid in the body, such as gout or kidney stones.  Choose low-purine foods, limit alcohol, and limit foods high in fat.  You will learn over time which foods do or do not affect you. If you find out that a food tends to cause your gout symptoms to flare up, avoid eating that food. This information is not intended to replace advice given to you by your health care provider. Make sure you discuss any questions you have with your health care provider. Document Released: 12/07/2010 Document Revised: 09/25/2016 Document Reviewed: 09/25/2016 Elsevier Interactive Patient Education  2019 [employer].

## 2019-02-02 NOTE — Assessment & Plan Note (Signed)
Right great toe  2nd flare since January - may have been caused by eating country ham  Disc low purine diet Disc good hydration Sent in colchicine for use acutely with food (watch for GI distress)  Will plan to check uric acid level /renal profile to see if she needs preventative medication  If worse or no improvement-also consider xray to look for erosive joint changes  Update if not starting to improve in a week or if worsening

## 2019-02-03 ENCOUNTER — Other Ambulatory Visit (INDEPENDENT_AMBULATORY_CARE_PROVIDER_SITE_OTHER): Payer: Medicare Other

## 2019-02-03 DIAGNOSIS — M1000 Idiopathic gout, unspecified site: Secondary | ICD-10-CM

## 2019-02-03 LAB — RENAL FUNCTION PANEL
Albumin: 4.1 g/dL (ref 3.5–5.2)
BUN: 17 mg/dL (ref 6–23)
CO2: 25 mEq/L (ref 19–32)
Calcium: 9.2 mg/dL (ref 8.4–10.5)
Chloride: 104 mEq/L (ref 96–112)
Creatinine, Ser: 0.88 mg/dL (ref 0.40–1.20)
GFR: 62.9 mL/min (ref >60.00)
Glucose, Bld: 99 mg/dL (ref 70–99)
Phosphorus: 3.7 mg/dL (ref 2.3–4.6)
Potassium: 4.2 mEq/L (ref 3.5–5.1)
Sodium: 137 mEq/L (ref 135–145)

## 2019-02-03 LAB — URIC ACID: Uric Acid, Serum: 6.4 mg/dL (ref 2.4–7.0)

## 2019-02-25 ENCOUNTER — Encounter: Payer: Self-pay | Admitting: Family Medicine

## 2019-02-25 DIAGNOSIS — M1000 Idiopathic gout, unspecified site: Secondary | ICD-10-CM

## 2019-03-01 NOTE — Telephone Encounter (Signed)
-----   Message from Cloyd Stagers, RT sent at 03/01/2019  3:40 PM EDT ----- Regarding: Xray Order for Tuesday 7.7.2020 Please place xray order for Tuesday 7.7.2020 Thanks Tam

## 2019-03-02 ENCOUNTER — Other Ambulatory Visit: Payer: Medicare Other

## 2019-03-02 ENCOUNTER — Other Ambulatory Visit: Payer: Self-pay

## 2019-03-02 ENCOUNTER — Ambulatory Visit (INDEPENDENT_AMBULATORY_CARE_PROVIDER_SITE_OTHER)
Admission: RE | Admit: 2019-03-02 | Discharge: 2019-03-02 | Disposition: A | Discharge status: Home / Self Care | Payer: Medicare Other | Source: Ambulatory Visit | Attending: Family Medicine | Admitting: Family Medicine

## 2019-03-02 DIAGNOSIS — M1000 Idiopathic gout, unspecified site: Secondary | ICD-10-CM

## 2019-03-02 DIAGNOSIS — M19071 Primary osteoarthritis, right ankle and foot: Secondary | ICD-10-CM | POA: Diagnosis not present

## 2019-03-03 ENCOUNTER — Telehealth: Payer: Self-pay | Admitting: Family Medicine

## 2019-03-03 ENCOUNTER — Encounter: Payer: Self-pay | Admitting: Family Medicine

## 2019-03-03 NOTE — Telephone Encounter (Signed)
-----   Message from Tammi Sou, [place] sent at 03/03/2019  1:09 PM EDT ----- Pt notified of Dr. Marliss Coots comments. Pt said she will give it a little more time to try and get relief from pain at home before she request a podiatry referral. She will call us back or mychart [place] if she gets to the point she wants a referral but she did have a few questions for Dr. Glori Bickers  ##PT REQUEST DR. Glori Bickers SEND HER A [place] QUESTIONS##  Pt said that she has taken one colchicine and she knows for gout she has to take it for a few days. Pt wanted to know if she needs to keep taking med given xray results, and if so how many tabs should she take and for how many days. Pt is aware she shouldn't take med long term so she just wanted to see how long to take med. Also pt has been taking advil for the pain but she knows it can be hard on kidneys so she wanted to see if you recommend her taking advil for pain and if so how many a day can she take and for how many days should she take med. Pt also said if Dr. Glori Bickers doesn't recommend her taking advil then what med should she take for pain. Again pt requested Dr. Glori Bickers to just mychart her with her answers to her questions

## 2019-03-29 ENCOUNTER — Other Ambulatory Visit: Payer: Self-pay

## 2019-03-29 ENCOUNTER — Encounter: Payer: Self-pay | Admitting: Family Medicine

## 2019-03-29 ENCOUNTER — Ambulatory Visit (INDEPENDENT_AMBULATORY_CARE_PROVIDER_SITE_OTHER): Payer: Medicare Other | Admitting: Family Medicine

## 2019-03-29 ENCOUNTER — Ambulatory Visit: Payer: Medicare Other

## 2019-03-29 VITALS — BP 136/74 | HR 61 | Temp 97.7°F | Ht 59.0 in | Wt 135.4 lb

## 2019-03-29 DIAGNOSIS — I10 Essential (primary) hypertension: Secondary | ICD-10-CM | POA: Diagnosis not present

## 2019-03-29 DIAGNOSIS — M8580 Other specified disorders of bone density and structure, unspecified site: Secondary | ICD-10-CM | POA: Diagnosis not present

## 2019-03-29 DIAGNOSIS — Z0000 Encounter for general adult medical examination without abnormal findings: Secondary | ICD-10-CM | POA: Diagnosis not present

## 2019-03-29 DIAGNOSIS — R7303 Prediabetes: Secondary | ICD-10-CM | POA: Diagnosis not present

## 2019-03-29 DIAGNOSIS — M1000 Idiopathic gout, unspecified site: Secondary | ICD-10-CM

## 2019-03-29 DIAGNOSIS — E7800 Pure hypercholesterolemia, unspecified: Secondary | ICD-10-CM

## 2019-03-29 LAB — COMPREHENSIVE METABOLIC PANEL
ALT: 21 U/L (ref 0–35)
AST: 20 U/L (ref 0–37)
Albumin: 4.4 g/dL (ref 3.5–5.2)
Alkaline Phosphatase: 70 U/L (ref 39–117)
BUN: 20 mg/dL (ref 6–23)
CO2: 26 mEq/L (ref 19–32)
Calcium: 9.9 mg/dL (ref 8.4–10.5)
Chloride: 101 mEq/L (ref 96–112)
Creatinine, Ser: 0.83 mg/dL (ref 0.40–1.20)
GFR: 67.27 mL/min (ref >60.00)
Glucose, Bld: 105 mg/dL — ABNORMAL HIGH (ref 70–99)
Potassium: 4.3 mEq/L (ref 3.5–5.1)
Sodium: 136 mEq/L (ref 135–145)
Total Bilirubin: 0.5 mg/dL (ref 0.2–1.2)
Total Protein: 7.1 g/dL (ref 6.0–8.3)

## 2019-03-29 LAB — CBC WITH DIFFERENTIAL/PLATELET
Basophils Absolute: 0.1 10*3/uL (ref 0.0–0.1)
Basophils Relative: 0.7 % (ref 0.0–3.0)
Eosinophils Absolute: 0.1 10*3/uL (ref 0.0–0.7)
Eosinophils Relative: 1.4 % (ref 0.0–5.0)
HCT: 39.2 % (ref 36.0–46.0)
Hemoglobin: 13.1 g/dL (ref 12.0–15.0)
Lymphocytes Relative: 34.8 % (ref 12.0–46.0)
Lymphs Abs: 2.6 10*3/uL (ref 0.7–4.0)
MCHC: 33.4 g/dL (ref 30.0–36.0)
MCV: 92.4 fl (ref 78.0–100.0)
Monocytes Absolute: 0.9 10*3/uL (ref 0.1–1.0)
Monocytes Relative: 11.8 % (ref 3.0–12.0)
Neutro Abs: 3.8 10*3/uL (ref 1.4–7.7)
Neutrophils Relative %: 51.3 % (ref 43.0–77.0)
Platelets: 281 10*3/uL (ref 150.0–400.0)
RBC: 4.24 Mil/uL (ref 3.87–5.11)
RDW: 14.1 % (ref 11.5–15.5)
WBC: 7.5 10*3/uL (ref 4.0–10.5)

## 2019-03-29 LAB — HEMOGLOBIN A1C: Hgb A1c MFr Bld: 6.6 % — ABNORMAL HIGH (ref 4.6–6.5)

## 2019-03-29 LAB — LDL CHOLESTEROL, DIRECT: Direct LDL: 97 mg/dL

## 2019-03-29 LAB — LIPID PANEL
Cholesterol: 186 mg/dL (ref 0–200)
HDL: 50.5 mg/dL (ref >39.00)
NonHDL: 135.85
Total CHOL/HDL Ratio: 4
Triglycerides: 321 mg/dL — ABNORMAL HIGH (ref 0.0–149.0)
VLDL: 64.2 mg/dL — ABNORMAL HIGH (ref 0.0–40.0)

## 2019-03-29 LAB — TSH: TSH: 2.58 u[IU]/mL (ref 0.35–4.50)

## 2019-03-29 MED ORDER — ATENOLOL 25 MG PO TABS: 25.0000 mg | ORAL_TABLET | Freq: Every day | ORAL | 3 refills | Status: DC

## 2019-03-29 MED ORDER — ATORVASTATIN CALCIUM 20 MG PO TABS: 20.0000 mg | ORAL_TABLET | Freq: Every day | ORAL | 3 refills | Status: DC

## 2019-03-29 MED ORDER — LISINOPRIL 20 MG PO TABS: 20.0000 mg | ORAL_TABLET | Freq: Every day | ORAL | 3 refills | Status: DC

## 2019-03-29 NOTE — Assessment & Plan Note (Addendum)
bp in fair control at this time  BP Readings from Last 1 Encounters:  03/29/19 136/74   No changes needed Most recent labs reviewed and labs ordered Disc lifstyle change with low sodium diet and exercise

## 2019-03-29 NOTE — Assessment & Plan Note (Signed)
Colchicine helps (gives diarrhea as well) Lab Results  Component Value Date   LABURIC 6.4 02/03/2019   This is re assuring  R foot looks better today

## 2019-03-29 NOTE — Assessment & Plan Note (Signed)
Disc goals for lipids and reasons to control them Rev last labs with pt (a year ago) Rev low sat fat diet in detail Lab today  She continues atorvasatatin and diet

## 2019-03-29 NOTE — Progress Notes (Signed)
Subjective:    Patient ID: Cindy [name], female    DOB: [birth year], 73 y.o.   MRN: [id number]  HPI Here for amw as well as annual review of chronic health problems   I have personally reviewed the Medicare Annual Wellness questionnaire and have noted 1. The patient's medical and social history 2. Their use of alcohol, tobacco or illicit drugs 3. Their current medications and supplements 4. The patient's functional ability including ADL's, fall risks, home safety risks and hearing or visual             impairment. 5. Diet and physical activities 6. Evidence for depression or mood disorders  The patients weight, height, BMI have been recorded in the chart and visual acuity is per eye clinic.  I have made referrals, counseling and provided education to the patient based review of the above and I have provided the pt with a written personalized care plan for preventive services. Reviewed and updated provider list, see scanned forms.  See scanned forms.  Routine anticipatory guidance given to patient.  See health maintenance. Colon cancer screening colonoscopy  11/18 with 5 y recall  Breast cancer screening  Mammogram 2/19 -is due /she will call to set it up  Self breast exam-no lumps  Flu vaccine 10/19-plans to get one in the fall  Tetanus vaccine Td 1/06 -thinks it may be covered at a pharmacy  Pneumovax completed  Zoster vaccine 9/14 zostavax  dexa h/o osteopenia   Declined dexa recently  Still declines due to the pandemic  Falls -none  Fractures - none  Supplements -takes D  Exercise - recent gout stopped it ,  She has some options at home , has a stepper and some print outs from PT / resistance things  Advance directive : has living will and POA - her poa is daughter  Cognitive function addressed- see scanned forms- and if abnormal then additional documentation follows No concerns  occ cannot think of a name- then it comes to her .   PMH and SH reviewed  Meds, vitals,  and allergies reviewed.   ROS: See HPI.  Otherwise negative.    Weight  Wt Readings from Last 3 Encounters:  03/29/19 135 lb 7 oz (61.4 kg)  03/16/18 138 lb 8 oz (62.8 kg)  03/16/18 138 lb 8 oz (62.8 kg)  stable  27.36 kg/m   Vision [facility]   Hearing Screening   125Hz  250Hz  500Hz  1000Hz  2000Hz  3000Hz  4000Hz  6000Hz  8000Hz   Right ear:   40 40 40  40    Left ear:   40 0 40  40    Vision Screening Comments: Pt had eye exam on June 2020 with [facility]    Depression screen : no issues with depression  Frustrated by pandemic   Gout is doing better Finally got better with colchicine - gave her diarrhea however    Hyperlipidemia Lab Results  Component Value Date   CHOL 194 03/16/2018   HDL 57.10 03/16/2018   LDLCALC 76 02/07/2014   LDLDIRECT 106.0 03/16/2018   TRIG 247.0 (H) 03/16/2018   CHOLHDL 3 03/16/2018   Due for labs Atorvastatin and diet  Diet is good    Prediabetes Lab Results  Component Value Date   HGBA1C 6.4 03/16/2018  due for labs   Patient Active Problem List   Diagnosis Date Noted  . Gout 02/02/2019  . Right hip pain 03/16/2018  . Colon cancer screening 03/11/2017  . Routine general medical examination  at a health care facility 02/28/2015  . Encounter for Medicare annual wellness exam 02/03/2013  . Osteopenia 02/03/2012  . Prediabetes 10/10/2010  . COLONIC POLYPS 03/05/2010  . Hyperlipidemia 07/27/2009  . Essential hypertension 07/27/2009  . BASAL CELL CARCINOMA, HX OF 07/27/2009   Past Medical History:  Diagnosis Date  . Benign neoplasm of colon   . MVP (mitral valve prolapse)    very mild  . Other abnormal glucose   . Other and unspecified hyperlipidemia   . Personal history of other malignant neoplasm of skin   . Unspecified essential hypertension    Past Surgical History:  Procedure Laterality Date  . CARDIAC CATHETERIZATION  1995   normal (after abnormal stress test)  . COLONOSCOPY  07/01/03   polyps  . MOHS SURGERY  11/09    for basal cell lesion   Social History   Tobacco Use  . Smoking status: Never Smoker  . Smokeless tobacco: Never Used  Substance Use Topics  . Alcohol use: Yes    Alcohol/week: 7.0 - 14.0 standard drinks    Types: 7 - 14 Shots of liquor per week    Comment: Regular  . Drug use: No   Family History  Problem Relation Age of Onset  . Macular degeneration Mother   . Sudden death Father 65  . Hyperlipidemia Brother   . Coronary artery disease Brother        CABG  . Hypertension Brother   . Colon cancer Neg Hx   . Esophageal cancer Neg Hx   . Rectal cancer Neg Hx   . Stomach cancer Neg Hx    Allergies  Allergen Reactions  . Ampicillin     REACTION: rash  . Calcium     REACTION: GI intolerance  . Cephalexin     REACTION: rash   Current Outpatient Medications on File Prior to Visit  Medication Sig Dispense Refill  . Cholecalciferol (VITAMIN D-3) 1000 units CAPS Take 1 capsule by mouth daily.    . colchicine 0.6 MG tablet Take 1-2 pills by mouth with food at the beginning of a gout flare.  Take one pill an hour later.  Then one pill every 6 hours as needed for gout pain with food 30 tablet 1  . diclofenac sodium (VOLTAREN) 1 % GEL Apply 2 g topically 4 (four) times daily as needed.    . Loperamide HCl (IMODIUM A-D PO) Take by mouth as needed.    Marland Kitchen OVER THE COUNTER MEDICATION     . triamcinolone cream (KENALOG) 0.1 % Apply 1 application topically 2 (two) times daily as needed.  2   No current facility-administered medications on file prior to visit.     Review of Systems  Constitutional: Negative for activity change, appetite change, fatigue, fever and unexpected weight change.  HENT: Negative for congestion, ear pain, rhinorrhea, sinus pressure and sore throat.   Eyes: Negative for pain, redness and visual disturbance.  Respiratory: Negative for cough, shortness of breath and wheezing.   Cardiovascular: Negative for chest pain and palpitations.  Gastrointestinal:  Negative for abdominal pain, blood in stool, constipation and diarrhea.  Endocrine: Negative for polydipsia and polyuria.  Genitourinary: Negative for dysuria, frequency and urgency.  Musculoskeletal: Positive for arthralgias. Negative for back pain and myalgias.       Right foot is feeling better after a bout of gout  Skin: Negative for pallor and rash.  Allergic/Immunologic: Negative for environmental allergies.  Neurological: Negative for dizziness, syncope and headaches.  Hematological: Negative for adenopathy. Does not bruise/bleed easily.  Psychiatric/Behavioral: Negative for decreased concentration and dysphoric mood. The patient is not nervous/anxious.        Objective:   Physical Exam Constitutional:      General: She is not in acute distress.    Appearance: Normal appearance. She is well-developed and normal weight. She is not ill-appearing or diaphoretic.  HENT:     Head: Normocephalic and atraumatic.     Right Ear: Tympanic membrane, ear canal and external ear normal.     Left Ear: Tympanic membrane, ear canal and external ear normal.     Nose: Nose normal.     Mouth/Throat:     Mouth: Mucous membranes are moist.     Pharynx: Oropharynx is clear. No posterior oropharyngeal erythema.  Eyes:     General: No scleral icterus.    Conjunctiva/sclera: Conjunctivae normal.     Pupils: Pupils are equal, round, and reactive to light.  Neck:     Musculoskeletal: Normal range of motion and neck supple. No muscular tenderness.     Thyroid: No thyromegaly.     Vascular: No carotid bruit or JVD.  Cardiovascular:     Rate and Rhythm: Normal rate and regular rhythm.     Pulses: Normal pulses.     Heart sounds: Normal heart sounds. No gallop.   Pulmonary:     Effort: Pulmonary effort is normal. No respiratory distress.     Breath sounds: Normal breath sounds. No wheezing or rales.     Comments: Good air exch Chest:     Chest wall: No tenderness.  Abdominal:     General: Bowel  sounds are normal. There is no distension or abdominal bruit.     Palpations: Abdomen is soft. There is no mass.     Tenderness: There is no abdominal tenderness.  Genitourinary:    Comments: Breast exam: No mass, nodules, thickening, tenderness, bulging, retraction, inflamation, nipple discharge or skin changes noted.  No axillary or clavicular LA.     Musculoskeletal: Normal range of motion.        General: No tenderness.     Right lower leg: No edema.     Left lower leg: No edema.     Comments: R first MTP joint is much less red and swollen  Lymphadenopathy:     Cervical: No cervical adenopathy.  Skin:    General: Skin is warm and dry.     Coloration: Skin is not pale.     Findings: No erythema or rash.     Comments: Solar lentigines diffusely   Neurological:     Mental Status: She is alert. She is disoriented.     Cranial Nerves: No cranial nerve deficit.     Motor: No weakness or abnormal muscle tone.     Coordination: Coordination normal.     Gait: Gait normal.     Deep Tendon Reflexes: Reflexes are normal and symmetric.  Psychiatric:        Mood and Affect: Mood normal.           Assessment & Plan:   Problem List Items Addressed This Visit      Cardiovascular and Mediastinum   Essential hypertension    bp in fair control at this time  BP Readings from Last 1 Encounters:  03/29/19 136/74   No changes needed Most recent labs reviewed and labs ordered Disc lifstyle change with low sodium diet and exercise  Relevant Medications   lisinopril (ZESTRIL) 20 MG tablet   atenolol (TENORMIN) 25 MG tablet   atorvastatin (LIPITOR) 20 MG tablet   Other Relevant Orders   Comprehensive metabolic panel   Lipid panel   TSH   CBC with Differential/Platelet     Musculoskeletal and Integument   Osteopenia    Pt declines dexa at this time  No falls or fx Taking vit D Walks for exercise when gout is not a problem Will continue to follow         Other    Hyperlipidemia    Disc goals for lipids and reasons to control them Rev last labs with pt (a year ago) Rev low sat fat diet in detail Lab today  She continues atorvasatatin and diet         Relevant Medications   lisinopril (ZESTRIL) 20 MG tablet   atenolol (TENORMIN) 25 MG tablet   atorvastatin (LIPITOR) 20 MG tablet   Other Relevant Orders   Lipid panel   Prediabetes    Last A1C 6.4 Lab drawn today  Diet is good- avoiding excess processed carbs Wt is stable  Pending results       Relevant Orders   Hemoglobin A1c   Encounter for Medicare annual wellness exam - Primary    Reviewed health habits including diet and exercise and skin cancer prevention Reviewed appropriate screening tests for age  Also reviewed health mt list, fam hx and immunization status , as well as social and family history   See HPI Labs ordered  Pt plans to schedule her own mammogram Also to get a flu shot in the fall  Has an adv directive  No cognitive concerns Reassuring hearing/vision and depression screen  Enc good health habits- add exercise in the home when able       Gout    Colchicine helps (gives diarrhea as well) Lab Results  Component Value Date   LABURIC 6.4 02/03/2019   This is re assuring  R foot looks better today

## 2019-03-29 NOTE — Assessment & Plan Note (Signed)
Reviewed health habits including diet and exercise and skin cancer prevention Reviewed appropriate screening tests for age  Also reviewed health mt list, fam hx and immunization status , as well as social and family history   See HPI Labs ordered  Pt plans to schedule her own mammogram Also to get a flu shot in the fall  Has an adv directive  No cognitive concerns Reassuring hearing/vision and depression screen  Enc good health habits- add exercise in the home when able

## 2019-03-29 NOTE — Patient Instructions (Addendum)
Make sure to schedule your mammogram   Please get a flu shot this fall   When your foot feels better get back to exercise indoors   Take care of yourself  Keep up a good fluid intake

## 2019-03-29 NOTE — Assessment & Plan Note (Signed)
Pt declines dexa at this time  No falls or fx Taking vit D Walks for exercise when gout is not a problem Will continue to follow

## 2019-03-29 NOTE — Assessment & Plan Note (Signed)
Last A1C 6.4 Lab drawn today  Diet is good- avoiding excess processed carbs Wt is stable  Pending results

## 2019-04-16 ENCOUNTER — Other Ambulatory Visit: Payer: Self-pay | Admitting: Family Medicine

## 2019-04-16 DIAGNOSIS — Z1231 Encounter for screening mammogram for malignant neoplasm of breast: Secondary | ICD-10-CM

## 2019-05-25 ENCOUNTER — Ambulatory Visit (INDEPENDENT_AMBULATORY_CARE_PROVIDER_SITE_OTHER): Payer: Medicare Other

## 2019-05-25 DIAGNOSIS — Z23 Encounter for immunization: Secondary | ICD-10-CM

## 2019-06-23 ENCOUNTER — Other Ambulatory Visit: Payer: Self-pay

## 2019-06-23 ENCOUNTER — Ambulatory Visit
Admission: RE | Admit: 2019-06-23 | Discharge: 2019-06-23 | Disposition: A | Discharge status: Home / Self Care | Payer: Medicare Other | Source: Ambulatory Visit | Attending: Family Medicine | Admitting: Family Medicine

## 2019-06-23 DIAGNOSIS — Z1231 Encounter for screening mammogram for malignant neoplasm of breast: Secondary | ICD-10-CM

## 2019-07-05 ENCOUNTER — Telehealth: Payer: Self-pay | Admitting: Family Medicine

## 2019-07-05 DIAGNOSIS — I10 Essential (primary) hypertension: Secondary | ICD-10-CM

## 2019-07-05 DIAGNOSIS — R7303 Prediabetes: Secondary | ICD-10-CM

## 2019-07-05 NOTE — Telephone Encounter (Signed)
-----   Message from Ellamae Sia sent at 06/30/2019 12:55 PM EST ----- Regarding: lab orders for Tuesday, 11.10.20 Lab orders

## 2019-07-06 ENCOUNTER — Other Ambulatory Visit (INDEPENDENT_AMBULATORY_CARE_PROVIDER_SITE_OTHER): Payer: Medicare Other

## 2019-07-06 DIAGNOSIS — I10 Essential (primary) hypertension: Secondary | ICD-10-CM

## 2019-07-06 DIAGNOSIS — R7303 Prediabetes: Secondary | ICD-10-CM

## 2019-07-06 LAB — BASIC METABOLIC PANEL
BUN: 24 mg/dL — ABNORMAL HIGH (ref 6–23)
CO2: 24 mEq/L (ref 19–32)
Calcium: 9.6 mg/dL (ref 8.4–10.5)
Chloride: 102 mEq/L (ref 96–112)
Creatinine, Ser: 0.91 mg/dL (ref 0.40–1.20)
GFR: 60.45 mL/min (ref >60.00)
Glucose, Bld: 108 mg/dL — ABNORMAL HIGH (ref 70–99)
Potassium: 4.8 mEq/L (ref 3.5–5.1)
Sodium: 134 mEq/L — ABNORMAL LOW (ref 135–145)

## 2019-07-06 LAB — HEMOGLOBIN A1C: Hgb A1c MFr Bld: 6.3 % (ref 4.6–6.5)

## 2019-09-05 ENCOUNTER — Ambulatory Visit: Payer: Medicare Other | Attending: Internal Medicine

## 2019-09-05 DIAGNOSIS — Z23 Encounter for immunization: Secondary | ICD-10-CM | POA: Diagnosis not present

## 2019-09-05 NOTE — Progress Notes (Signed)
   Covid-19 Vaccination Clinic  Name:  Cindy [name]    MRN: [id number] DOB: December 17, 1945  09/05/2019  [name] was observed post Covid-19 immunization for 15 minutes without incidence. She was provided with Vaccine Information Sheet and instruction to access the V-Safe system.   [name] was instructed to call 911 with any severe reactions post vaccine: Marland Kitchen Difficulty breathing  . Swelling of your face and throat  . A fast heartbeat  . A bad rash all over your body  . Dizziness and weakness    Immunizations Administered    Name Date Dose VIS Date Route   Pfizer COVID-19 Vaccine 09/05/2019  1:56 PM 0.3 mL 08/06/2019 Intramuscular   Manufacturer: [employer], [employer]   Lot: [id number]   [place]: [id number]

## 2019-09-13 DIAGNOSIS — Z20828 Contact with and (suspected) exposure to other viral communicable diseases: Secondary | ICD-10-CM | POA: Diagnosis not present

## 2019-09-26 ENCOUNTER — Ambulatory Visit: Payer: Medicare Other | Attending: Internal Medicine

## 2019-09-26 DIAGNOSIS — Z23 Encounter for immunization: Secondary | ICD-10-CM | POA: Insufficient documentation

## 2019-09-26 NOTE — Progress Notes (Signed)
   Covid-19 Vaccination Clinic  Name:  Cindy [name]    MRN: [id number] DOB: April 27, 1946  09/26/2019  [name] was observed post Covid-19 immunization for 15 minutes without incidence. She was provided with Vaccine Information Sheet and instruction to access the V-Safe system.   [name] was instructed to call 911 with any severe reactions post vaccine: Marland Kitchen Difficulty breathing  . Swelling of your face and throat  . A fast heartbeat  . A bad rash all over your body  . Dizziness and weakness    Immunizations Administered    Name Date Dose VIS Date Route   Pfizer COVID-19 Vaccine 09/26/2019 10:31 AM 0.3 mL 08/06/2019 Intramuscular   Manufacturer: [place]   Lot: [id number]   [place]: [id number]

## 2019-11-15 ENCOUNTER — Encounter: Payer: Self-pay | Admitting: Family Medicine

## 2019-11-16 ENCOUNTER — Telehealth: Payer: Self-pay | Admitting: Family Medicine

## 2019-11-16 DIAGNOSIS — M1000 Idiopathic gout, unspecified site: Secondary | ICD-10-CM

## 2019-11-16 NOTE — Telephone Encounter (Signed)
-----   Message from Cloyd Stagers, RT sent at 11/15/2019  1:29 PM EDT ----- Regarding: Lab Orders for Thursday 3.25.2021 Please place lab orders for Thursday 3.25.2021, appt notes state "f/u labs" Thank you, Dyke Maes RT(R)

## 2019-11-18 ENCOUNTER — Other Ambulatory Visit: Payer: Medicare Other

## 2019-11-22 ENCOUNTER — Other Ambulatory Visit: Payer: Self-pay

## 2019-11-22 ENCOUNTER — Other Ambulatory Visit (INDEPENDENT_AMBULATORY_CARE_PROVIDER_SITE_OTHER): Payer: Medicare Other

## 2019-11-22 DIAGNOSIS — M1000 Idiopathic gout, unspecified site: Secondary | ICD-10-CM | POA: Diagnosis not present

## 2019-11-22 LAB — BASIC METABOLIC PANEL
BUN: 19 mg/dL (ref 6–23)
CO2: 26 mEq/L (ref 19–32)
Calcium: 9.9 mg/dL (ref 8.4–10.5)
Chloride: 102 mEq/L (ref 96–112)
Creatinine, Ser: 0.88 mg/dL (ref 0.40–1.20)
GFR: 62.77 mL/min (ref >60.00)
Glucose, Bld: 99 mg/dL (ref 70–99)
Potassium: 3.8 mEq/L (ref 3.5–5.1)
Sodium: 136 mEq/L (ref 135–145)

## 2019-11-22 LAB — URIC ACID: Uric Acid, Serum: 6.3 mg/dL (ref 2.4–7.0)

## 2019-11-30 ENCOUNTER — Encounter: Payer: Self-pay | Admitting: Family Medicine

## 2019-11-30 DIAGNOSIS — M10079 Idiopathic gout, unspecified ankle and foot: Secondary | ICD-10-CM

## 2019-11-30 NOTE — Telephone Encounter (Signed)
Rheumatology referral

## 2020-02-25 NOTE — Progress Notes (Signed)
Office Visit Note  Patient: Cindy [name]             Date of Birth: 02-May-1946           MRN: [id number]             PCP: Abner Greenspan, MD Referring: Tower, Wynelle Fanny, MD Visit Date: 03/09/2020 Occupation: @GUAROCC @  Subjective:  Pain in toes.   History of Present Illness: Cindy [name] is a 74 y.o. female seen in consultation per request of her PCP.  According to the patient in January 2020 she developed right toe swelling which was very painful and woke her up in the middle of the night.  She went and took some Advil which helped her symptoms.  The symptoms recurred in June 2020 in the same toe which did not respond to Advil.  She had a virtual visit with Dr. Glori Bickers and had a x-ray.  She recalls her uric acid level was 6.4.  As her symptoms did not respond she took some colchicine and it caused diarrhea and then eventually the symptoms improved.  In March 2021 she had similar episode in her left foot and the great toe it got to the point that the dorsum of her foot was also swollen.  She states at the time her uric acid level was 6.3.  She got the prescription of colchicine and took 2 tablets at one time.  Followed by 1 tablet later she states she had severe diarrhea which was difficult to manage.  Her symptoms eventually resolved.  She states she still have some sensitivity in her toes when she wear shoes.  None of the other joints are painful or swollen.  She has some chronic discomfort in her hips which she relates to osteoarthritis.  She drinks wine and hard liquor.  She used to drink it on daily basis but now she drinks 3-4 times a week.  She is avoiding red meat.  She eats shrimp.  Activities of Daily Living:  Patient reports morning stiffness for a couple of hours.   Patient Reports nocturnal pain.  Difficulty dressing/grooming: Denies Difficulty climbing stairs: Reports Difficulty getting out of chair: Denies Difficulty using hands for taps, buttons, cutlery, and/or writing:  Denies  Review of Systems  Constitutional: Negative for fatigue.  HENT: Negative for mouth sores, mouth dryness and nose dryness.   Eyes: Negative for itching and dryness.  Respiratory: Negative for shortness of breath and difficulty breathing.   Cardiovascular: Negative for chest pain and palpitations.  Gastrointestinal: Positive for diarrhea. Negative for blood in stool and constipation.  Endocrine: Negative for increased urination.  Genitourinary: Negative for difficulty urinating.  Musculoskeletal: Positive for arthralgias, joint pain, joint swelling, myalgias, morning stiffness, muscle tenderness and myalgias.  Skin: Negative for color change, rash and redness.  Allergic/Immunologic: Negative for susceptible to infections.  Neurological: Negative for dizziness, numbness, headaches, memory loss and weakness.  Hematological: Negative for bruising/bleeding tendency.  Psychiatric/Behavioral: Negative for confusion.    PMFS History:  Patient Active Problem List   Diagnosis Date Noted  . Gout 02/02/2019  . Right hip pain 03/16/2018  . Colon cancer screening 03/11/2017  . Routine general medical examination at a health care facility 02/28/2015  . Encounter for Medicare annual wellness exam 02/03/2013  . Osteopenia 02/03/2012  . Prediabetes 10/10/2010  . COLONIC POLYPS 03/05/2010  . Hyperlipidemia 07/27/2009  . Essential hypertension 07/27/2009  . BASAL CELL CARCINOMA, HX OF 07/27/2009    Past Medical History:  Diagnosis  Date  . Benign neoplasm of colon   . MVP (mitral valve prolapse)    very mild  . Other abnormal glucose   . Other and unspecified hyperlipidemia   . Personal history of other malignant neoplasm of skin   . Unspecified essential hypertension     Family History  Problem Relation Age of Onset  . Macular degeneration Mother   . Sudden death Father 63  . Heart disease Brother   . Gout Brother   . Arthritis Brother   . Healthy Daughter   . Colon cancer Neg  Hx   . Esophageal cancer Neg Hx   . Rectal cancer Neg Hx   . Stomach cancer Neg Hx    Past Surgical History:  Procedure Laterality Date  . CARDIAC CATHETERIZATION  1995   normal (after abnormal stress test)  . COLONOSCOPY  07/01/03   polyps  . MOHS SURGERY  11/09   for basal cell lesion   Social History   Social History Narrative   Retired-med tech      Married-husband has advanced parkinson's      No regular exercise   Immunization History  Administered Date(s) Administered  . Fluad Quad(high Dose 65+) 05/25/2019  . Hepatitis B 07/03/1988, 07/30/1988, 01/01/1989  . Influenza Split 06/04/2011  . Influenza Whole 04/26/2009, 05/26/2009, 05/28/2010, 06/02/2012  . Influenza-Unspecified 06/01/2013, 05/27/2014, 05/26/2018  . PFIZER SARS-COV-2 Vaccination 09/05/2019, 09/26/2019  . Pneumococcal Conjugate-13 01/12/2015  . Pneumococcal Polysaccharide-23 10/24/2010  . Td 08/26/2004  . Zoster 05/07/2013     Objective: Vital Signs: BP (!) 165/80 (BP Location: Right Arm, Patient Position: Sitting, Cuff Size: Normal)   Pulse 60   Resp 13   Ht 5' (1.524 m)   Wt 137 lb 9.6 oz (62.4 kg)   BMI 26.87 kg/m    Physical Exam Vitals and nursing note reviewed.  Constitutional:      Appearance: She is well-developed.  HENT:     Head: Normocephalic and atraumatic.  Eyes:     Conjunctiva/sclera: Conjunctivae normal.  Cardiovascular:     Rate and Rhythm: Normal rate and regular rhythm.     Heart sounds: Normal heart sounds.  Pulmonary:     Effort: Pulmonary effort is normal.     Breath sounds: Normal breath sounds.  Abdominal:     General: Bowel sounds are normal.     Palpations: Abdomen is soft.  Musculoskeletal:     Cervical back: Normal range of motion.  Lymphadenopathy:     Cervical: No cervical adenopathy.  Skin:    General: Skin is warm and dry.     Capillary Refill: Capillary refill takes less than 2 seconds.  Neurological:     Mental Status: She is alert and oriented  to person, place, and time.  Psychiatric:        Behavior: Behavior normal.      Musculoskeletal Exam: C-spine thoracic and lumbar spine good range of motion.  Shoulder joints, elbow joints, wrist joints with good range of motion.  She has bilateral DIP thickening with no synovitis.  She has some limitation of range of motion of her hip joints.  Knee joints with good range of motion without involved swelling or effusion.  She has good range of motion of bilateral ankle joints.  She has some redness and tenderness over left first MTP joint.  All other joints with full range of motion.  CDAI Exam: CDAI Score: -- Patient Global: --; Provider Global: -- Swollen: --; Tender: -- Joint Exam  03/09/2020   No joint exam has been documented for this visit   There is currently no information documented on the homunculus. Go to the Rheumatology activity and complete the homunculus joint exam.  Investigation: No additional findings.  Imaging: XR Foot 2 Views Left  Result Date: 03/09/2020 First MTP, PIP and DIP narrowing was noted.  No erosive changes were noted.  No intertarsal or tibiotalar joint space narrowing was noted.  Dorsal spurring and inferior calcaneal spur was noted. Impression: These findings are consistent with osteoarthritis of the foot.   Recent Labs: Lab Results  Component Value Date   WBC 7.5 03/29/2019   HGB 13.1 03/29/2019   PLT 281.0 03/29/2019   NA 136 11/22/2019   K 3.8 11/22/2019   CL 102 11/22/2019   CO2 26 11/22/2019   GLUCOSE 99 11/22/2019   BUN 19 11/22/2019   CREATININE 0.88 11/22/2019   BILITOT 0.5 03/29/2019   ALKPHOS 70 03/29/2019   AST 20 03/29/2019   ALT 21 03/29/2019   PROT 7.1 03/29/2019   ALBUMIN 4.4 03/29/2019   CALCIUM 9.9 11/22/2019    Speciality Comments: No specialty comments available.  Procedures:  No procedures performed Allergies: Ampicillin, Calcium, Cephalexin, and Colchicine   Assessment / Plan:     Visit Diagnoses: Chronic  idiopathic gout involving toe without tophus, unspecified laterality -patient had several episodes of gout in the last 1-1/2-year involving her first MTPs.  Detailed counseling regimen gout was provided.  She does not have significant hyperuricemia.  But she drinks alcoholic beverage 3-4 times a week which she was previously drinking on daily basis.  She also consumes red meat and shellfish.  Dietary modifications were discussed at length.  She developed diarrhea when she took colchicine 3 tablets a day.  We are detailed counseling regarding use of colchicine.  I have advised her to try half a tablet and then go to 1 tablet daily.  We will add allopurinol 100 mg p.o. daily 1 week after taking colchicine.  She will take allopurinol 1 tablet daily for 1 week and then increase it to 2 tablets daily.  Pain in both feet -she continues to have some discomfort in her left first MTP joint.  I reviewed x-ray of her right foot from March 02, 2019 which showed some degenerative changes but no erosive changes were noted.  Plan: Uric acid, Sedimentation rate, Rheumatoid factor, Cyclic citrul peptide antibody, IgG, XR Foot 2 Views Left.  X-ray findings are consistent with osteoarthritis.  No erosive changes were noted.  X-ray findings were discussed with the patient.  Primary osteoarthritis of both hands-clinical findings are consistent with osteoarthritis.  She is not having much discomfort.  Medication monitoring encounter - Plan: CBC with Differential/Platelet, COMPLETE METABOLIC PANEL WITH GFR today as baseline.  We will recheck labs in 1 month after starting the medication.  Essential hypertension-blood pressure is still elevated.  Have advised her to monitor blood pressure closely.  Other medical problems are listed as follows:  Hx of colonic polyps  History of basal cell carcinoma  History of hyperlipidemia  Prediabetes  Orders: Orders Placed This Encounter  Procedures  . XR Foot 2 Views Left  . CBC  with Differential/Platelet  . COMPLETE METABOLIC PANEL WITH GFR  . Uric acid  . Sedimentation rate  . Rheumatoid factor  . Cyclic citrul peptide antibody, IgG   Meds ordered this encounter  Medications  . colchicine 0.6 MG tablet    Sig: Take 1 tablet (0.6 mg total)  by mouth daily.    Dispense:  30 tablet    Refill:  1  . allopurinol (ZYLOPRIM) 100 MG tablet    Sig: Take 100mg  by mouth daily for 1 week, then increase to 200mg  by mouth daily.    Dispense:  49 tablet    Refill:  0      Follow-Up Instructions: Return for Gout.   Bo Merino, MD  Note - This record has been created using Editor, commissioning.  Chart creation errors have been sought, but may not always  have been located. Such creation errors do not reflect on  the standard of medical care.

## 2020-03-09 ENCOUNTER — Ambulatory Visit (INDEPENDENT_AMBULATORY_CARE_PROVIDER_SITE_OTHER): Payer: Medicare Other | Admitting: Rheumatology

## 2020-03-09 ENCOUNTER — Encounter: Payer: Self-pay | Admitting: Rheumatology

## 2020-03-09 ENCOUNTER — Other Ambulatory Visit: Payer: Self-pay

## 2020-03-09 ENCOUNTER — Ambulatory Visit: Payer: Self-pay

## 2020-03-09 VITALS — BP 165/80 | HR 60 | Resp 13 | Ht 60.0 in | Wt 137.6 lb

## 2020-03-09 DIAGNOSIS — Z85828 Personal history of other malignant neoplasm of skin: Secondary | ICD-10-CM

## 2020-03-09 DIAGNOSIS — M79671 Pain in right foot: Secondary | ICD-10-CM

## 2020-03-09 DIAGNOSIS — Z5181 Encounter for therapeutic drug level monitoring: Secondary | ICD-10-CM | POA: Diagnosis not present

## 2020-03-09 DIAGNOSIS — I10 Essential (primary) hypertension: Secondary | ICD-10-CM | POA: Diagnosis not present

## 2020-03-09 DIAGNOSIS — M19042 Primary osteoarthritis, left hand: Secondary | ICD-10-CM | POA: Diagnosis not present

## 2020-03-09 DIAGNOSIS — M19041 Primary osteoarthritis, right hand: Secondary | ICD-10-CM | POA: Diagnosis not present

## 2020-03-09 DIAGNOSIS — Z86010 Personal history of colonic polyps: Secondary | ICD-10-CM | POA: Diagnosis not present

## 2020-03-09 DIAGNOSIS — M79672 Pain in left foot: Secondary | ICD-10-CM | POA: Diagnosis not present

## 2020-03-09 DIAGNOSIS — M1A0790 Idiopathic chronic gout, unspecified ankle and foot, without tophus (tophi): Secondary | ICD-10-CM | POA: Diagnosis not present

## 2020-03-09 DIAGNOSIS — Z8639 Personal history of other endocrine, nutritional and metabolic disease: Secondary | ICD-10-CM

## 2020-03-09 DIAGNOSIS — R7303 Prediabetes: Secondary | ICD-10-CM | POA: Diagnosis not present

## 2020-03-09 MED ORDER — COLCHICINE 0.6 MG PO TABS
0.6000 mg | ORAL_TABLET | Freq: Every day | ORAL | 1 refills | Status: DC
Start: 2020-03-09 — End: 2020-05-03

## 2020-03-09 MED ORDER — ALLOPURINOL 100 MG PO TABS: ORAL_TABLET | ORAL | 0 refills | Status: DC

## 2020-03-09 NOTE — Patient Instructions (Signed)
Take colchicine 0.6 mg by mouth daily and stay on it.  Start allopurinol 100 mg tablet, 1 tablet daily 1 week after starting colchicine.  Take allopurinol 1 tablet daily for 1 week and then increase it to 2 tablets daily.  Stay on the same dose of allopurinol and colchicine.  Return in 1 month for lab work.

## 2020-03-10 LAB — CBC WITH DIFFERENTIAL/PLATELET
Absolute Monocytes: 780 cells/uL (ref 200–950)
Basophils Absolute: 68 cells/uL (ref 0–200)
Basophils Relative: 0.9 %
Eosinophils Absolute: 83 cells/uL (ref 15–500)
Eosinophils Relative: 1.1 %
HCT: 38.6 % (ref 35.0–45.0)
Hemoglobin: 12.9 g/dL (ref 11.7–15.5)
Lymphs Abs: 2678 cells/uL (ref 850–3900)
MCH: 30.7 pg (ref 27.0–33.0)
MCHC: 33.4 g/dL (ref 32.0–36.0)
MCV: 91.9 fL (ref 80.0–100.0)
MPV: 10.6 fL (ref 7.5–12.5)
Monocytes Relative: 10.4 %
Neutro Abs: 3893 cells/uL (ref 1500–7800)
Neutrophils Relative %: 51.9 %
Platelets: 303 10*3/uL (ref 140–400)
RBC: 4.2 10*6/uL (ref 3.80–5.10)
RDW: 13.1 % (ref 11.0–15.0)
Total Lymphocyte: 35.7 %
WBC: 7.5 10*3/uL (ref 3.8–10.8)

## 2020-03-10 LAB — COMPLETE METABOLIC PANEL WITH GFR
AG Ratio: 1.5 (calc) (ref 1.0–2.5)
ALT: 18 U/L (ref 6–29)
AST: 20 U/L (ref 10–35)
Albumin: 4.3 g/dL (ref 3.6–5.1)
Alkaline phosphatase (APISO): 71 U/L (ref 37–153)
BUN/Creatinine Ratio: 22 (calc) (ref 6–22)
BUN: 21 mg/dL (ref 7–25)
CO2: 22 mmol/L (ref 20–32)
Calcium: 9.8 mg/dL (ref 8.6–10.4)
Chloride: 102 mmol/L (ref 98–110)
Creat: 0.95 mg/dL — ABNORMAL HIGH (ref 0.60–0.93)
GFR, Est African American: 68 mL/min/{1.73_m2} (ref >=60)
GFR, Est Non African American: 59 mL/min/{1.73_m2} — ABNORMAL LOW (ref >=60)
Globulin: 2.9 g/dL (calc) (ref 1.9–3.7)
Glucose, Bld: 106 mg/dL — ABNORMAL HIGH (ref 65–99)
Potassium: 4.8 mmol/L (ref 3.5–5.3)
Sodium: 138 mmol/L (ref 135–146)
Total Bilirubin: 0.4 mg/dL (ref 0.2–1.2)
Total Protein: 7.2 g/dL (ref 6.1–8.1)

## 2020-03-10 LAB — URIC ACID: Uric Acid, Serum: 8.7 mg/dL — ABNORMAL HIGH (ref 2.5–7.0)

## 2020-03-10 LAB — SEDIMENTATION RATE: Sed Rate: 25 mm/h (ref 0–30)

## 2020-03-10 LAB — RHEUMATOID FACTOR: Rheumatoid fact SerPl-aCnc: <14 IU/mL (ref <14)

## 2020-03-10 LAB — CYCLIC CITRUL PEPTIDE ANTIBODY, IGG: Cyclic Citrullin Peptide Ab: <16 UNITS

## 2020-03-10 NOTE — Progress Notes (Signed)
CBC is normal.  Creatinine is mildly elevated.  Uric acid is elevated at 8.7.  The goal is to have uric acid below 6.  We discussed use of colchicine and allopurinol yesterday.  Patient should continue with the treatment plan as discussed during the office visit.

## 2020-03-17 ENCOUNTER — Other Ambulatory Visit: Payer: Self-pay | Admitting: Family Medicine

## 2020-03-27 NOTE — Progress Notes (Signed)
Office Visit Note  Patient: Cindy [name]             Date of Birth: [birth year]           MRN: [id number]             PCP: Abner Greenspan, MD Referring: Tower, Wynelle Fanny, MD Visit Date: 04/04/2020 Occupation: @GUAROCC @  Subjective:  Medication management.   History of Present Illness: Cindy [name] is a 74 y.o. female with history of gout and osteoarthritis.  She was a started on colchicine and allopurinol combination at the last visit.  She has been taking colchicine once a day and allopurinol 200 mg a day.  She has noticed great improvement in her symptoms.  She states she has not had any discomfort in her feet.  She denies any gout flares.  She has been tolerating medications well.  Activities of Daily Living:  Patient reports morning stiffness for a few  minutes.   Patient Reports nocturnal pain.  Difficulty dressing/grooming: Denies Difficulty climbing stairs: Reports Difficulty getting out of chair: Denies Difficulty using hands for taps, buttons, cutlery, and/or writing: Denies  Review of Systems  Constitutional: Negative for fatigue.  HENT: Negative for mouth sores, mouth dryness and nose dryness.   Eyes: Negative for itching and dryness.  Respiratory: Negative for shortness of breath and difficulty breathing.   Cardiovascular: Negative for chest pain and palpitations.  Gastrointestinal: Negative for blood in stool, constipation and diarrhea.  Endocrine: Negative for increased urination.  Genitourinary: Negative for difficulty urinating.  Musculoskeletal: Positive for arthralgias, joint pain and morning stiffness. Negative for joint swelling, myalgias, muscle tenderness and myalgias.  Skin: Negative for color change, rash and redness.  Allergic/Immunologic: Negative for susceptible to infections.  Neurological: Negative for dizziness, numbness, headaches, memory loss and weakness.  Hematological: Negative for bruising/bleeding tendency.  Psychiatric/Behavioral:  Negative for confusion and sleep disturbance.    PMFS History:  Patient Active Problem List   Diagnosis Date Noted  . Gout 02/02/2019  . Right hip pain 03/16/2018  . Colon cancer screening 03/11/2017  . Routine general medical examination at a health care facility 02/28/2015  . Encounter for Medicare annual wellness exam 02/03/2013  . Osteopenia 02/03/2012  . Prediabetes 10/10/2010  . COLONIC POLYPS 03/05/2010  . Hyperlipidemia 07/27/2009  . Essential hypertension 07/27/2009  . BASAL CELL CARCINOMA, HX OF 07/27/2009    Past Medical History:  Diagnosis Date  . Benign neoplasm of colon   . MVP (mitral valve prolapse)    very mild  . Other abnormal glucose   . Other and unspecified hyperlipidemia   . Personal history of other malignant neoplasm of skin   . Unspecified essential hypertension     Family History  Problem Relation Age of Onset  . Macular degeneration Mother   . Sudden death Father 55  . Heart disease Brother   . Gout Brother   . Arthritis Brother   . Healthy Daughter   . Colon cancer Neg Hx   . Esophageal cancer Neg Hx   . Rectal cancer Neg Hx   . Stomach cancer Neg Hx    Past Surgical History:  Procedure Laterality Date  . CARDIAC CATHETERIZATION  1995   normal (after abnormal stress test)  . COLONOSCOPY  07/01/03   polyps  . MOHS SURGERY  11/09   for basal cell lesion   Social History   Social History Narrative   Retired-med tech      Married-husband has advanced parkinson's  No regular exercise   Immunization History  Administered Date(s) Administered  . Fluad Quad(high Dose 65+) 05/25/2019  . Hepatitis B 07/03/1988, 07/30/1988, 01/01/1989  . Influenza Split 06/04/2011  . Influenza Whole 04/26/2009, 05/26/2009, 05/28/2010, 06/02/2012  . Influenza, High Dose Seasonal PF 06/17/2018  . Influenza,inj,Quad PF,6+ Mos 06/18/2017  . Influenza-Unspecified 06/01/2013, 05/27/2014, 05/26/2018  . PFIZER SARS-COV-2 Vaccination 09/05/2019,  09/26/2019  . Pneumococcal Conjugate-13 01/12/2015  . Pneumococcal Polysaccharide-23 10/24/2010  . Td 08/26/2004  . Zoster 05/07/2013     Objective: Vital Signs: BP (!) 154/79 (BP Location: Left Arm, Patient Position: Sitting, Cuff Size: Normal)   Pulse 61   Resp 15   Ht 4' 11.25" (1.505 m)   Wt 135 lb 9.6 oz (61.5 kg)   BMI 27.16 kg/m    Physical Exam Vitals and nursing note reviewed.  Constitutional:      Appearance: She is well-developed.  HENT:     Head: Normocephalic and atraumatic.  Eyes:     Conjunctiva/sclera: Conjunctivae normal.  Cardiovascular:     Rate and Rhythm: Normal rate and regular rhythm.     Heart sounds: Normal heart sounds.  Pulmonary:     Effort: Pulmonary effort is normal.     Breath sounds: Normal breath sounds.  Abdominal:     General: Bowel sounds are normal.     Palpations: Abdomen is soft.  Musculoskeletal:     Cervical back: Normal range of motion.  Lymphadenopathy:     Cervical: No cervical adenopathy.  Skin:    General: Skin is warm and dry.     Capillary Refill: Capillary refill takes less than 2 seconds.  Neurological:     Mental Status: She is alert and oriented to person, place, and time.  Psychiatric:        Behavior: Behavior normal.      Musculoskeletal Exam: C-spine was in good range of motion.  Shoulder joints, elbow joints, wrist joints with good range of motion.  She has bilateral PIP and DIP thickening in her hands and feet.  Hip joints and knee joints in good range of motion with no synovitis.  No warmth or swelling was noted in any of her joints.  CDAI Exam: CDAI Score: -- Patient Global: --; Provider Global: -- Swollen: --; Tender: -- Joint Exam 04/04/2020   No joint exam has been documented for this visit   There is currently no information documented on the homunculus. Go to the Rheumatology activity and complete the homunculus joint exam.  Investigation: No additional findings.  Imaging: XR Foot 2 Views  Left  Result Date: 03/09/2020 First MTP, PIP and DIP narrowing was noted.  No erosive changes were noted.  No intertarsal or tibiotalar joint space narrowing was noted.  Dorsal spurring and inferior calcaneal spur was noted. Impression: These findings are consistent with osteoarthritis of the foot.   Recent Labs: Lab Results  Component Value Date   WBC 7.5 03/09/2020   HGB 12.9 03/09/2020   PLT 303 03/09/2020   NA 138 03/09/2020   K 4.8 03/09/2020   CL 102 03/09/2020   CO2 22 03/09/2020   GLUCOSE 106 (H) 03/09/2020   BUN 21 03/09/2020   CREATININE 0.95 (H) 03/09/2020   BILITOT 0.4 03/09/2020   ALKPHOS 70 03/29/2019   AST 20 03/09/2020   ALT 18 03/09/2020   PROT 7.2 03/09/2020   ALBUMIN 4.4 03/29/2019   CALCIUM 9.8 03/09/2020   GFRAA 68 03/09/2020  March 09, 2020 uric acid 8.7, RF negative, anti-CCP  negative, ESR 25  Speciality Comments: No specialty comments available.  Procedures:  No procedures performed Allergies: Ampicillin, Calcium, Cephalexin, and Colchicine   Assessment / Plan:     Visit Diagnoses: Chronic idiopathic gout involving toe without tophus, unspecified laterality -she is doing very well without any increased joint pain or swelling.  She denies having any gout flares.  She had chronic discomfort in her feet which is resolved.  She has been taking colchicine 0.6 mg once a day and allopurinol 200 mg a day.  I plan to obtain her uric acid level today.  If it is not below 6.0 then I will increase allopurinol to 300 mg p.o. daily.  After increasing the dose of allopurinol we will check labs in a month which would include CBC CMP and uric acid and then every 3 months.  Once labs are stable we can check every 6 months.  Plan: Uric acid, Uric acid  Hyperuricemia -last labs showed hyperuricemia.  I discussed with her that uric acid levels are usually low at the time of flare and the increase was the flare is over.  Cindy [name] consumes red meat and shellfish.  She states since  her last visit she has changed her diet and has not consumed any red meat or shellfish.  She stopped drinking alcohol.  Although she would like to resume that in the near future.  Medication monitoring encounter - Colchicine 3 times daily cause diarrhea in the past.  She was started on colchicine 1 p.o. daily and allopurinol 100 mg p.o. dailyx 1 week then 200 mg p.o. dail - Plan: CBC with Differential/Platelet, COMPLETE METABOLIC PANEL WITH GFR, today.  CBC with Differential/Platelet, COMPLETE METABOLIC PANEL WITH GFR in 1 month after increasing the dose of allopurinol.  History of alcohol use - 3-4 times a week-she has cut down on her alcohol consumption.  Primary osteoarthritis of both hands-she has DIP and PIP thickening but no synovitis.  Primary osteoarthritis of both feet-she is osteoarthritic changes.  She had no tenderness on palpation.  Essential hypertension-her blood pressure is still elevated.  Have advised her to monitor blood pressure closely.  History of hyperlipidemia-she is on Lipitor and is monitoring her diet.  Prediabetes  Hx of colonic polyps  History of basal cell carcinoma   Patient is fully vaccinated against COVID-19.  I have advised her to continue to wear mask, practice social distancing and hand hygiene.  She was also advised in case she develops COVID-19 infection she may be a candidate to receive monoclonal antibody infusion.  Orders: Orders Placed This Encounter  Procedures  . CBC with Differential/Platelet  . COMPLETE METABOLIC PANEL WITH GFR  . Uric acid  . CBC with Differential/Platelet  . COMPLETE METABOLIC PANEL WITH GFR  . Uric acid   No orders of the defined types were placed in this encounter.   Follow-Up Instructions: Return in about 5 weeks (around 05/09/2020) for Gout, Osteoarthritis.   Bo Merino, MD  Note - This record has been created using Editor, commissioning.  Chart creation errors have been sought, but may not always  have  been located. Such creation errors do not reflect on  the standard of medical care.

## 2020-03-31 ENCOUNTER — Ambulatory Visit (INDEPENDENT_AMBULATORY_CARE_PROVIDER_SITE_OTHER): Payer: Medicare Other | Admitting: Family Medicine

## 2020-03-31 ENCOUNTER — Encounter: Payer: Self-pay | Admitting: Family Medicine

## 2020-03-31 ENCOUNTER — Ambulatory Visit (INDEPENDENT_AMBULATORY_CARE_PROVIDER_SITE_OTHER): Payer: Medicare Other

## 2020-03-31 ENCOUNTER — Other Ambulatory Visit: Payer: Self-pay

## 2020-03-31 VITALS — BP 130/82 | HR 57 | Temp 96.5°F | Ht 59.25 in | Wt 133.4 lb

## 2020-03-31 DIAGNOSIS — M8580 Other specified disorders of bone density and structure, unspecified site: Secondary | ICD-10-CM | POA: Diagnosis not present

## 2020-03-31 DIAGNOSIS — M10079 Idiopathic gout, unspecified ankle and foot: Secondary | ICD-10-CM

## 2020-03-31 DIAGNOSIS — R7303 Prediabetes: Secondary | ICD-10-CM | POA: Diagnosis not present

## 2020-03-31 DIAGNOSIS — E7800 Pure hypercholesterolemia, unspecified: Secondary | ICD-10-CM | POA: Diagnosis not present

## 2020-03-31 DIAGNOSIS — Z0000 Encounter for general adult medical examination without abnormal findings: Secondary | ICD-10-CM | POA: Diagnosis not present

## 2020-03-31 DIAGNOSIS — I10 Essential (primary) hypertension: Secondary | ICD-10-CM

## 2020-03-31 LAB — LIPID PANEL
Cholesterol: 171 mg/dL (ref 0–200)
HDL: 46.6 mg/dL (ref >39.00)
NonHDL: 124.62
Total CHOL/HDL Ratio: 4
Triglycerides: 245 mg/dL — ABNORMAL HIGH (ref 0.0–149.0)
VLDL: 49 mg/dL — ABNORMAL HIGH (ref 0.0–40.0)

## 2020-03-31 LAB — TSH: TSH: 2.85 u[IU]/mL (ref 0.35–4.50)

## 2020-03-31 LAB — LDL CHOLESTEROL, DIRECT: Direct LDL: 88 mg/dL

## 2020-03-31 LAB — HEMOGLOBIN A1C: Hgb A1c MFr Bld: 6.3 % (ref 4.6–6.5)

## 2020-03-31 MED ORDER — ATENOLOL 25 MG PO TABS: ORAL_TABLET | ORAL | 3 refills | Status: DC

## 2020-03-31 MED ORDER — LISINOPRIL 20 MG PO TABS: ORAL_TABLET | ORAL | 3 refills | Status: DC

## 2020-03-31 MED ORDER — ATORVASTATIN CALCIUM 20 MG PO TABS: ORAL_TABLET | ORAL | 3 refills | Status: DC

## 2020-03-31 NOTE — Progress Notes (Signed)
Subjective:   Cindy [name] is a 74 y.o. female who presents for Medicare Annual (Subsequent) preventive examination.  Review of Systems: N/A     I connected with the patient today by telephone and verified that I am speaking with the correct person using two identifiers. Location patient: home Location nurse: work Persons participating in the virtual visit: patient, Marine scientist.   I discussed the limitations, risks, security and privacy concerns of performing an evaluation and management service by telephone and the availability of in person appointments. I also discussed with the patient that there may be a patient responsible charge related to this service. The patient expressed understanding and verbally consented to this telephonic visit.    Interactive audio and video telecommunications were attempted between this nurse and patient, however failed, due to patient having technical difficulties OR patient did not have access to video capability.  We continued and completed visit with audio only.     Cardiac Risk Factors include: advanced age (>12men, >29 women);hypertension     Objective:    Today's Vitals   There is no height or weight on file to calculate BMI.  Advanced Directives 03/31/2020 03/16/2018 07/04/2017 03/11/2017 03/07/2016  Does Patient Have a Medical Advance Directive? Yes Yes Yes Yes Yes  Type of Paramedic of [place];Living will [place];Living will [place];Living will [place];Living will [place];Living will  Does patient want to make changes to medical advance directive? - - - - No - Patient declined  Copy of [place] in Chart? No - copy requested No - copy requested - No - copy requested No - copy requested    Current Medications (verified) Outpatient Encounter Medications as of 03/31/2020  Medication Sig  . allopurinol (ZYLOPRIM) 100 MG  tablet Take 100mg  by mouth daily for 1 week, then increase to 200mg  by mouth daily.  Marland Kitchen atenolol (TENORMIN) 25 MG tablet TAKE 1 TABLET(25 MG) BY MOUTH DAILY  . atorvastatin (LIPITOR) 20 MG tablet TAKE 1 TABLET(20 MG) BY MOUTH DAILY  . Cholecalciferol (VITAMIN D-3) 1000 units CAPS Take 1 capsule by mouth daily.  . colchicine 0.6 MG tablet Take 1 tablet (0.6 mg total) by mouth daily.  Marland Kitchen lisinopril (ZESTRIL) 20 MG tablet TAKE 1 TABLET(20 MG) BY MOUTH DAILY  . Loperamide HCl (IMODIUM A-D PO) Take by mouth as needed.  . triamcinolone cream (KENALOG) 0.1 % Apply 1 application topically 2 (two) times daily as needed.   No facility-administered encounter medications on file as of 03/31/2020.    Allergies (verified) Ampicillin, Calcium, Cephalexin, and Colchicine   History: Past Medical History:  Diagnosis Date  . Benign neoplasm of colon   . MVP (mitral valve prolapse)    very mild  . Other abnormal glucose   . Other and unspecified hyperlipidemia   . Personal history of other malignant neoplasm of skin   . Unspecified essential hypertension    Past Surgical History:  Procedure Laterality Date  . CARDIAC CATHETERIZATION  1995   normal (after abnormal stress test)  . COLONOSCOPY  07/01/03   polyps  . MOHS SURGERY  11/09   for basal cell lesion   Family History  Problem Relation Age of Onset  . Macular degeneration Mother   . Sudden death Father 62  . Heart disease Brother   . Gout Brother   . Arthritis Brother   . Healthy Daughter   . Colon cancer Neg Hx   .  Esophageal cancer Neg Hx   . Rectal cancer Neg Hx   . Stomach cancer Neg Hx    Social History   Socioeconomic History  . Marital status: Widowed    Spouse name: Not on file  . Number of children: Not on file  . Years of education: Not on file  . Highest education level: Not on file  Occupational History  . Occupation: retired  Tobacco Use  . Smoking status: Never Smoker  . Smokeless tobacco: Never Used  Vaping Use    . Vaping Use: Never used  Substance and Sexual Activity  . Alcohol use: Yes    Alcohol/week: 0.0 standard drinks    Comment: occasional  . Drug use: No  . Sexual activity: Never  Other Topics Concern  . Not on file  Social History Narrative   Retired-med tech      Married-husband has advanced parkinson's      No regular exercise   Social Determinants of Health   Financial Resource Strain: Low Risk   . Difficulty of Paying Living Expenses: Not hard at all  Food Insecurity: No Food Insecurity  . Worried About Charity fundraiser in the Last Year: Never true  . Ran Out of Food in the Last Year: Never true  Transportation Needs: No Transportation Needs  . Lack of Transportation (Medical): No  . Lack of Transportation (Non-Medical): No  Physical Activity: Sufficiently Active  . Days of Exercise per Week: 7 days  . Minutes of Exercise per Session: 60 min  Stress: No Stress Concern Present  . Feeling of Stress : Not at all  Social Connections:   . Frequency of Communication with Friends and Family:   . Frequency of Social Gatherings with Friends and Family:   . Attends Religious Services:   . Active Member of Clubs or Organizations:   . Attends Archivist Meetings:   Marland Kitchen Marital Status:     Tobacco Counseling Counseling given: Not Answered   Clinical Intake:  Pre-visit preparation completed: Yes  Pain : No/denies pain     Nutritional Risks: None Diabetes: No  How often do you need to have someone help you when you read instructions, pamphlets, or other written materials from your doctor or pharmacy?: 1 - Never What is the last grade level you completed in school?: bachelors  Diabetic: No Nutrition Risk Assessment:  Has the patient had any N/V/D within the last 2 months?  No  Does the patient have any non-healing wounds?  No  Has the patient had any unintentional weight loss or weight gain?  No   Diabetes:  Is the patient diabetic?  No  If  diabetic, was a CBG obtained today?  No  Did the patient bring in their glucometer from home?  No  How often do you monitor your CBG's? N/A.   Financial Strains and Diabetes Management:  Are you having any financial strains with the device, your supplies or your medication? N/A.  Does the patient want to be seen by Chronic Care Management for management of their diabetes?  N/A Would the patient like to be referred to a Nutritionist or for Diabetic Management?  N/A     Interpreter Needed?: No  Information entered by :: CJohnson, LPN   Activities of Daily Living In your present state of health, do you have any difficulty performing the following activities: 03/31/2020  Hearing? N  Vision? N  Difficulty concentrating or making decisions? N  Walking or climbing stairs?  N  Dressing or bathing? N  Doing errands, shopping? N  Preparing Food and eating ? N  Using the Toilet? N  In the past six months, have you accidently leaked urine? N  Do you have problems with loss of bowel control? N  Managing your Medications? N  Managing your Finances? N  Housekeeping or managing your Housekeeping? N  Some recent data might be hidden    Patient Care Team: Tower, Wynelle Fanny, MD as PCP - General Agapito Games as Referring Physician (Optometry) Druscilla Brownie, MD as Consulting Physician (Dermatology)  Indicate any recent Medical Services you may have received from other than [facility] providers in the past year (date may be approximate).     Assessment:   This is a routine wellness examination for Cindy [name].  Hearing/Vision screen  Hearing Screening   125Hz  250Hz  500Hz  1000Hz  2000Hz  3000Hz  4000Hz  6000Hz  8000Hz   Right ear:           Left ear:           Vision Screening Comments: Patient gets annual eye exams   Dietary issues and exercise activities discussed: Current Exercise Habits: The patient does not participate in regular exercise at present, Exercise limited by: None  identified  Goals    .  Increase physical activity (pt-stated)      Starting 03/11/2017, I will resume exercise as tolerated for at least 30 min 2-3 days per week.     .  Patient Stated      Starting 03/16/2018, I will continue to take medications as prescribed.      .  Patient Stated      03/31/2020, I will maintain and continue medications as prescribed.       Depression Screen PHQ 2/9 Scores 03/31/2020 03/29/2019 03/16/2018 03/11/2017 03/07/2016 02/28/2015 02/07/2014  PHQ - 2 Score 0 0 0 0 0 0 0  PHQ- 9 Score 0 - 0 - - - -    Fall Risk Fall Risk  03/31/2020 03/29/2019 03/16/2018 03/16/2018 03/11/2017  Falls in the past year? 0 0 Yes Yes No  Comment - - - tripped in parking lot -  Number falls in past yr: 0 0 1 1 -  Injury with Fall? 0 0 Yes Yes -  Risk for fall due to : Medication side effect - - - -  Follow up Falls evaluation completed Falls evaluation completed - - -    Any stairs in or around the home? Yes  If so, are there any without handrails? No  Home free of loose throw rugs in walkways, pet beds, electrical cords, etc? Yes  Adequate lighting in your home to reduce risk of falls? Yes   ASSISTIVE DEVICES UTILIZED TO PREVENT FALLS:  Life alert? No  Use of a cane, walker or w/c? No  Grab bars in the bathroom? No  Shower chair or bench in shower? No  Elevated toilet seat or a handicapped toilet? No   TIMED UP AND GO:  Was the test performed? N/A, telephonic visit .   Cognitive Function: MMSE - Mini Mental State Exam 03/31/2020 03/16/2018 03/11/2017 03/07/2016  Orientation to time 5 5 5 5   Orientation to Place 5 5 5 5   Registration 3 3 3 3   Attention/ Calculation 5 0 0 0  Recall 3 3 3 3   Language- name 2 objects - 0 0 0  Language- repeat 1 1 1 1   Language- follow 3 step command - 3 3 3   Language- read & follow direction -  0 0 0  Write a sentence - 0 0 0  Copy design - 0 0 0  Total score - 20 20 20   Mini Cog  Mini-Cog screen was completed. Maximum score is 22. A value of 0  denotes this part of the MMSE was not completed or the patient failed this part of the Mini-Cog screening.       Immunizations Immunization History  Administered Date(s) Administered  . Fluad Quad(high Dose 65+) 05/25/2019  . Hepatitis B 07/03/1988, 07/30/1988, 01/01/1989  . Influenza Split 06/04/2011  . Influenza Whole 04/26/2009, 05/26/2009, 05/28/2010, 06/02/2012  . Influenza, High Dose Seasonal PF 06/17/2018  . Influenza,inj,Quad PF,6+ Mos 06/18/2017  . Influenza-Unspecified 06/01/2013, 05/27/2014, 05/26/2018  . PFIZER SARS-COV-2 Vaccination 09/05/2019, 09/26/2019  . Pneumococcal Conjugate-13 01/12/2015  . Pneumococcal Polysaccharide-23 10/24/2010  . Td 08/26/2004  . Zoster 05/07/2013    TDAP status: Due, Education has been provided regarding the importance of this vaccine. Advised may receive this vaccine at local pharmacy or Health Dept. Aware to provide a copy of the vaccination record if obtained from local pharmacy or Health Dept. Verbalized acceptance and understanding. Flu Vaccine status: due, available in the office at the end of August Pneumococcal vaccine status: Up to date Covid-19 vaccine status: Completed vaccines  Qualifies for Shingles Vaccine? Yes   Zostavax completed Yes   Shingrix Completed?: No.    Education has been provided regarding the importance of this vaccine. Patient has been advised to call insurance company to determine out of pocket expense if they have not yet received this vaccine. Advised may also receive vaccine at local pharmacy or Health Dept. Verbalized acceptance and understanding.  Screening Tests Health Maintenance  Topic Date Due  . INFLUENZA VACCINE  03/26/2020  . DEXA SCAN  09/27/2020 (Originally 08/10/2010)  . TETANUS/TDAP  09/28/2023 (Originally 08/26/2014)  . MAMMOGRAM  06/22/2021  . COLONOSCOPY  07/04/2022  . COVID-19 Vaccine  Completed  . Hepatitis C Screening  Completed  . PNA vac Low Risk Adult  Completed    Health  Maintenance  Health Maintenance Due  Topic Date Due  . INFLUENZA VACCINE  03/26/2020    Colorectal cancer screening: Completed 07/04/2017. Repeat every 5 years Mammogram status: Completed 06/23/2019. Repeat every year Bone Density status: declined  Lung Cancer Screening: (Low Dose CT Chest recommended if Age 22-80 years, 30 pack-year currently smoking OR have quit w/in 15years.) does not qualify.    Additional Screening:  Hepatitis C Screening: does qualify; Completed 08/27/1998  Vision Screening: Recommended annual ophthalmology exams for early detection of glaucoma and other disorders of the eye. Is the patient up to date with their annual eye exam?  Yes  Who is the provider or what is the name of the office in which the patient attends annual eye exams? [facility] If pt is not established with a provider, would they like to be referred to a provider to establish care? No .   Dental Screening: Recommended annual dental exams for proper oral hygiene  Community Resource Referral / Chronic Care Management: CRR required this visit?  No   CCM required this visit?  No      Plan:     I have personally reviewed and noted the following in the patient's chart:   . Medical and social history . Use of alcohol, tobacco or illicit drugs  . Current medications and supplements . Functional ability and status . Nutritional status . Physical activity . Advanced directives . List of other physicians .  Hospitalizations, surgeries, and ER visits in previous 12 months . Vitals . Screenings to include cognitive, depression, and falls . Referrals and appointments  In addition, I have reviewed and discussed with patient certain preventive protocols, quality metrics, and best practice recommendations. A written personalized care plan for preventive services as well as general preventive health recommendations were provided to patient.   Due to this being a telephonic visit, the after  visit summary with patients personalized plan was offered to patient via mail or my-chart. Patient preferred to pick up at office at next visit.   Andrez Grime, LPN   11/30/3956

## 2020-03-31 NOTE — Progress Notes (Signed)
PCP notes:  Health Maintenance: Tdap- decline-insurance/financial   Abnormal Screenings: none   Patient concerns: none   Nurse concerns: none   Next PCP appt.: Today @ 10:45 am

## 2020-03-31 NOTE — Patient Instructions (Addendum)
If you are interested in the new shingles vaccine (Shingrix) - call your local pharmacy to check on coverage and availability  If affordable, get on a wait list at your pharmacy to get the vaccine.  Labs today   Keep taking good care of yourself

## 2020-03-31 NOTE — Assessment & Plan Note (Signed)
In foot Seeing rheumatology- is now on allopurinol and titrating Reviewed labs

## 2020-03-31 NOTE — Patient Instructions (Signed)
[name] , Thank you for taking time to come for your Medicare Wellness Visit. I appreciate your ongoing commitment to your health goals. Please review the following plan we discussed and let me know if I can assist you in the future.   Screening recommendations/referrals: Colonoscopy: Up to date, completed 07/04/2017, due 06/2022 Mammogram: Up to date, completed 06/23/2019, due 05/2020 Bone Density: declined Recommended yearly ophthalmology/optometry visit for glaucoma screening and checkup Recommended yearly dental visit for hygiene and checkup  Vaccinations: Influenza vaccine: due, will be available in the office at the end of August Pneumococcal vaccine: Completed series Tdap vaccine: decline-insurance/financial Shingles vaccine: due, check with your insurance regarding coverage   Covid-19:Completed series  Advanced directives: Please bring a copy of your POA (Power of Attorney) and/or Living Will to your next appointment.   Conditions/risks identified: hypertension  Next appointment: Follow up in one year for your annual wellness visit    Preventive Care 33 Years and Older, Female Preventive care refers to lifestyle choices and visits with your health care provider that can promote health and wellness. What does preventive care include?  A yearly physical exam. This is also called an annual well check.  Dental exams once or twice a year.  Routine eye exams. Ask your health care provider how often you should have your eyes checked.  Personal lifestyle choices, including:  Daily care of your teeth and gums.  Regular physical activity.  Eating a healthy diet.  Avoiding tobacco and drug use.  Limiting alcohol use.  Practicing safe sex.  Taking low-dose aspirin every day.  Taking vitamin and mineral supplements as recommended by your health care provider. What happens during an annual well check? The services and screenings done by your health care provider  during your annual well check will depend on your age, overall health, lifestyle risk factors, and family history of disease. Counseling  Your health care provider may ask you questions about your:  Alcohol use.  Tobacco use.  Drug use.  Emotional well-being.  Home and relationship well-being.  Sexual activity.  Eating habits.  History of falls.  Memory and ability to understand (cognition).  Work and work Statistician.  Reproductive health. Screening  You may have the following tests or measurements:  Height, weight, and BMI.  Blood pressure.  Lipid and cholesterol levels. These may be checked every 5 years, or more frequently if you are over 29 years old.  Skin check.  Lung cancer screening. You may have this screening every year starting at age 34 if you have a 30-pack-year history of smoking and currently smoke or have quit within the past 15 years.  Fecal occult blood test (FOBT) of the stool. You may have this test every year starting at age 22.  Flexible sigmoidoscopy or colonoscopy. You may have a sigmoidoscopy every 5 years or a colonoscopy every 10 years starting at age 54.  Hepatitis C blood test.  Hepatitis B blood test.  Sexually transmitted disease (STD) testing.  Diabetes screening. This is done by checking your blood sugar (glucose) after you have not eaten for a while (fasting). You may have this done every 1-3 years.  Bone density scan. This is done to screen for osteoporosis. You may have this done starting at age 47.  Mammogram. This may be done every 1-2 years. Talk to your health care provider about how often you should have regular mammograms. Talk with your health care provider about your test results, treatment options, and if necessary, the  need for more tests. Vaccines  Your health care provider may recommend certain vaccines, such as:  Influenza vaccine. This is recommended every year.  Tetanus, diphtheria, and acellular pertussis  (Tdap, Td) vaccine. You may need a Td booster every 10 years.  Zoster vaccine. You may need this after age 51.  Pneumococcal 13-valent conjugate (PCV13) vaccine. One dose is recommended after age 76.  Pneumococcal polysaccharide (PPSV23) vaccine. One dose is recommended after age 45. Talk to your health care provider about which screenings and vaccines you need and how often you need them. This information is not intended to replace advice given to you by your health care provider. Make sure you discuss any questions you have with your health care provider. Document Released: 09/08/2015 Document Revised: 05/01/2016 Document Reviewed: 06/13/2015 Elsevier Interactive Patient Education  2017 [place] Prevention in the Home Falls can cause injuries. They can happen to people of all ages. There are many things you can do to make your home safe and to help prevent falls. What can I do on the outside of my home?  Regularly fix the edges of walkways and driveways and fix any cracks.  Remove anything that might make you trip as you walk through a door, such as a raised step or threshold.  Trim any bushes or trees on the path to your home.  Use bright outdoor lighting.  Clear any walking paths of anything that might make someone trip, such as rocks or tools.  Regularly check to see if handrails are loose or broken. Make sure that both sides of any steps have handrails.  Any raised decks and porches should have guardrails on the edges.  Have any leaves, snow, or ice cleared regularly.  Use sand or salt on walking paths during winter.  Clean up any spills in your garage right away. This includes oil or grease spills. What can I do in the bathroom?  Use night lights.  Install grab bars by the toilet and in the tub and shower. Do not use towel bars as grab bars.  Use non-skid mats or decals in the tub or shower.  If you need to sit down in the shower, use a plastic, non-slip  stool.  Keep the floor dry. Clean up any water that spills on the floor as soon as it happens.  Remove soap buildup in the tub or shower regularly.  Attach bath mats securely with double-sided non-slip rug tape.  Do not have throw rugs and other things on the floor that can make you trip. What can I do in the bedroom?  Use night lights.  Make sure that you have a light by your bed that is easy to reach.  Do not use any sheets or blankets that are too big for your bed. They should not hang down onto the floor.  Have a firm chair that has side arms. You can use this for support while you get dressed.  Do not have throw rugs and other things on the floor that can make you trip. What can I do in the kitchen?  Clean up any spills right away.  Avoid walking on wet floors.  Keep items that you use a lot in easy-to-reach places.  If you need to reach something above you, use a strong step stool that has a grab bar.  Keep electrical cords out of the way.  Do not use floor polish or wax that makes floors slippery. If you must use wax,  use non-skid floor wax.  Do not have throw rugs and other things on the floor that can make you trip. What can I do with my stairs?  Do not leave any items on the stairs.  Make sure that there are handrails on both sides of the stairs and use them. Fix handrails that are broken or loose. Make sure that handrails are as long as the stairways.  Check any carpeting to make sure that it is firmly attached to the stairs. Fix any carpet that is loose or worn.  Avoid having throw rugs at the top or bottom of the stairs. If you do have throw rugs, attach them to the floor with carpet tape.  Make sure that you have a light switch at the top of the stairs and the bottom of the stairs. If you do not have them, ask someone to add them for you. What else can I do to help prevent falls?  Wear shoes that:  Do not have high heels.  Have rubber bottoms.  Are  comfortable and fit you well.  Are closed at the toe. Do not wear sandals.  If you use a stepladder:  Make sure that it is fully opened. Do not climb a closed stepladder.  Make sure that both sides of the stepladder are locked into place.  Ask someone to hold it for you, if possible.  Clearly mark and make sure that you can see:  Any grab bars or handrails.  First and last steps.  Where the edge of each step is.  Use tools that help you move around (mobility aids) if they are needed. These include:  Canes.  Walkers.  Scooters.  Crutches.  Turn on the lights when you go into a dark area. Replace any light bulbs as soon as they burn out.  Set up your furniture so you have a clear path. Avoid moving your furniture around.  If any of your floors are uneven, fix them.  If there are any pets around you, be aware of where they are.  Review your medicines with your doctor. Some medicines can make you feel dizzy. This can increase your chance of falling. Ask your doctor what other things that you can do to help prevent falls. This information is not intended to replace advice given to you by your health care provider. Make sure you discuss any questions you have with your health care provider. Document Released: 06/08/2009 Document Revised: 01/18/2016 Document Reviewed: 09/16/2014 Elsevier Interactive Patient Education  2017 [employer].

## 2020-03-31 NOTE — Progress Notes (Signed)
Subjective:    Patient ID: Cindy [name], female    DOB: Apr 04, 1946, 74 y.o.   MRN: [id number]  This visit occurred during the SARS-CoV-2 public health emergency.  Safety protocols were in place, including screening questions prior to the visit, additional usage of staff PPE, and extensive cleaning of exam room while observing appropriate contact time as indicated for disinfecting solutions.    HPI Pt presents for annual f/u of chronic medical problems   Wt Readings from Last 3 Encounters:  03/31/20 133 lb 7 oz (60.5 kg)  03/09/20 137 lb 9.6 oz (62.4 kg)  03/29/19 135 lb 7 oz (61.4 kg)  wt is down- she is watching diet - avoiding etoh (for gout)   26.72 kg/m   Not doing a lot  Has been to the beach  Same routine otherwise   amw was today  Noted tetanus postponed/financial  Flu shot - will get in the fall  pna vaccines are complete Vaccinated for covid  Zoster status -zostavax 9/14/may be interested in shingrix later   Mammogram 10/20 Self breast exam -no lumps or changes   Colonoscopy 11/18   dexa-declines Falls - none  Fractures -none  Exercise -curtailed by gout/when she can she likes to walk  Taking vitamin D   HTN bp is stable today  No cp or palpitations or headaches or edema  No side effects to medicines  BP Readings from Last 3 Encounters:  03/31/20 130/82  03/09/20 (!) 165/80  03/29/19 136/74     Due for labs  Pulse Readings from Last 3 Encounters:  03/31/20 (!) 57  03/09/20 60  03/29/19 61      Hyperlipidemia Lab Results  Component Value Date   CHOL 186 03/29/2019   HDL 50.50 03/29/2019   LDLCALC 76 02/07/2014   LDLDIRECT 97.0 03/29/2019   TRIG 321.0 (H) 03/29/2019   CHOLHDL 4 03/29/2019   Due for labs  Atorvastatin and diet   Prediabetes Lab Results  Component Value Date   HGBA1C 6.3 07/06/2019   Pt had rheumatology labs on 7/15 Lab Results  Component Value Date   CREATININE 0.95 (H) 03/09/2020   BUN 21 03/09/2020   NA  138 03/09/2020   K 4.8 03/09/2020   CL 102 03/09/2020   CO2 22 03/09/2020   Glucose 106 Globulin 2.9 Lab Results  Component Value Date   WBC 7.5 03/09/2020   HGB 12.9 03/09/2020   HCT 38.6 03/09/2020   MCV 91.9 03/09/2020   PLT 303 03/09/2020  sed rate 25  Struggling through gout-seeing rheumatology  Taking allopurinol  She is starting to improve finally- L foot   Patient Active Problem List   Diagnosis Date Noted  . Gout 02/02/2019  . Right hip pain 03/16/2018  . Colon cancer screening 03/11/2017  . Routine general medical examination at a health care facility 02/28/2015  . Encounter for Medicare annual wellness exam 02/03/2013  . Osteopenia 02/03/2012  . Prediabetes 10/10/2010  . COLONIC POLYPS 03/05/2010  . Hyperlipidemia 07/27/2009  . Essential hypertension 07/27/2009  . BASAL CELL CARCINOMA, HX OF 07/27/2009   Past Medical History:  Diagnosis Date  . Benign neoplasm of colon   . MVP (mitral valve prolapse)    very mild  . Other abnormal glucose   . Other and unspecified hyperlipidemia   . Personal history of other malignant neoplasm of skin   . Unspecified essential hypertension    Past Surgical History:  Procedure Laterality Date  . [place]  normal (after abnormal stress test)  . COLONOSCOPY  07/01/03   polyps  . MOHS SURGERY  11/09   for basal cell lesion   Social History   Tobacco Use  . Smoking status: Never Smoker  . Smokeless tobacco: Never Used  Vaping Use  . Vaping Use: Never used  Substance Use Topics  . Alcohol use: Yes    Alcohol/week: 0.0 standard drinks    Comment: occasional  . Drug use: No   Family History  Problem Relation Age of Onset  . Macular degeneration Mother   . Sudden death Father 11  . Heart disease Brother   . Gout Brother   . Arthritis Brother   . Healthy Daughter   . Colon cancer Neg Hx   . Esophageal cancer Neg Hx   . Rectal cancer Neg Hx   . Stomach cancer Neg Hx    Allergies    Allergen Reactions  . Ampicillin     REACTION: rash  . Calcium     REACTION: GI intolerance  . Cephalexin     REACTION: rash  . Colchicine     Diarrhea   Current Outpatient Medications on File Prior to Visit  Medication Sig Dispense Refill  . allopurinol (ZYLOPRIM) 100 MG tablet Take 100mg  by mouth daily for 1 week, then increase to 200mg  by mouth daily. 49 tablet 0  . Cholecalciferol (VITAMIN D-3) 1000 units CAPS Take 1 capsule by mouth daily.    . colchicine 0.6 MG tablet Take 1 tablet (0.6 mg total) by mouth daily. 30 tablet 1  . Loperamide HCl (IMODIUM A-D PO) Take by mouth as needed.    . triamcinolone cream (KENALOG) 0.1 % Apply 1 application topically 2 (two) times daily as needed.  2   No current facility-administered medications on file prior to visit.    Review of Systems  Constitutional: Negative for activity change, appetite change, fatigue, fever and unexpected weight change.  HENT: Negative for congestion, ear pain, rhinorrhea, sinus pressure and sore throat.   Eyes: Negative for pain, redness and visual disturbance.  Respiratory: Negative for cough, shortness of breath and wheezing.   Cardiovascular: Negative for chest pain and palpitations.  Gastrointestinal: Negative for abdominal pain, blood in stool, constipation and diarrhea.  Endocrine: Negative for polydipsia and polyuria.  Genitourinary: Negative for dysuria, frequency and urgency.  Musculoskeletal: Positive for arthralgias and joint swelling. Negative for back pain and myalgias.  Skin: Negative for pallor and rash.  Allergic/Immunologic: Negative for environmental allergies.  Neurological: Negative for dizziness, syncope and headaches.  Hematological: Negative for adenopathy. Does not bruise/bleed easily.  Psychiatric/Behavioral: Negative for decreased concentration and dysphoric mood. The patient is not nervous/anxious.        Objective:   Physical Exam Constitutional:      General: She is not in  acute distress.    Appearance: Normal appearance. She is well-developed and normal weight. She is not ill-appearing or diaphoretic.  HENT:     Head: Normocephalic and atraumatic.     Right Ear: Tympanic membrane, ear canal and external ear normal.     Left Ear: Tympanic membrane, ear canal and external ear normal.     Nose: Nose normal. No congestion.     Mouth/Throat:     Mouth: Mucous membranes are moist.     Pharynx: Oropharynx is clear. No posterior oropharyngeal erythema.  Eyes:     General: No scleral icterus.    Extraocular Movements: Extraocular movements intact.     Conjunctiva/sclera:  Conjunctivae normal.     Pupils: Pupils are equal, round, and reactive to light.  Neck:     Thyroid: No thyromegaly.     Vascular: No carotid bruit or JVD.  Cardiovascular:     Rate and Rhythm: Normal rate and regular rhythm.     Pulses: Normal pulses.     Heart sounds: Normal heart sounds. No gallop.   Pulmonary:     Effort: Pulmonary effort is normal. No respiratory distress.     Breath sounds: Normal breath sounds. No wheezing.     Comments: Good air exch Chest:     Chest wall: No tenderness.  Abdominal:     General: Bowel sounds are normal. There is no distension or abdominal bruit.     Palpations: Abdomen is soft. There is no mass.     Tenderness: There is no abdominal tenderness.     Hernia: No hernia is present.  Genitourinary:    Comments: Breast exam: No mass, nodules, thickening, tenderness, bulging, retraction, inflamation, nipple discharge or skin changes noted.  No axillary or clavicular LA.     Musculoskeletal:        General: No tenderness. Normal range of motion.     Cervical back: Normal range of motion and neck supple. No rigidity. No muscular tenderness.     Right lower leg: No edema.     Left lower leg: No edema.     Comments: No kyphosis  No acute joint changes   Lymphadenopathy:     Cervical: No cervical adenopathy.  Skin:    General: Skin is warm and dry.      Coloration: Skin is not pale.     Findings: No erythema or rash.     Comments: Solar lentigines diffusely   Neurological:     Mental Status: She is alert. Mental status is at baseline.     Cranial Nerves: No cranial nerve deficit.     Motor: No abnormal muscle tone.     Coordination: Coordination normal.     Gait: Gait normal.     Deep Tendon Reflexes: Reflexes are normal and symmetric. Reflexes normal.  Psychiatric:        Mood and Affect: Mood normal.        Cognition and Memory: Cognition and memory normal.           Assessment & Plan:   Problem List Items Addressed This Visit      Cardiovascular and Mediastinum   Essential hypertension - Primary    bp in fair control at this time  BP Readings from Last 1 Encounters:  03/31/20 130/82   No changes needed Most recent labs reviewed  Disc lifstyle change with low sodium diet and exercise        Relevant Medications   lisinopril (ZESTRIL) 20 MG tablet   atenolol (TENORMIN) 25 MG tablet   atorvastatin (LIPITOR) 20 MG tablet   Other Relevant Orders   Lipid panel (Completed)   TSH (Completed)     Musculoskeletal and Integument   Osteopenia    Declines dexa  No falls or fractures Walking for exercise  Taking vit D        Other   Hyperlipidemia    Due to labs on atorvastatin and diet  Has been well controlled Disc goals for lipids and reasons to control them Rev last labs with pt Rev low sat fat diet in detail       Relevant Medications   lisinopril (ZESTRIL) 20 MG  tablet   atenolol (TENORMIN) 25 MG tablet   atorvastatin (LIPITOR) 20 MG tablet   Other Relevant Orders   Lipid panel (Completed)   Prediabetes    A1C done  Mindful of sugar in her diet       Relevant Orders   Hemoglobin A1c (Completed)   Gout    In foot Seeing rheumatology- is now on allopurinol and titrating Reviewed labs

## 2020-04-02 NOTE — Assessment & Plan Note (Signed)
Declines dexa  No falls or fractures Walking for exercise  Taking vit D

## 2020-04-02 NOTE — Assessment & Plan Note (Signed)
A1C done  Mindful of sugar in her diet

## 2020-04-02 NOTE — Assessment & Plan Note (Signed)
Due to labs on atorvastatin and diet  Has been well controlled Disc goals for lipids and reasons to control them Rev last labs with pt Rev low sat fat diet in detail

## 2020-04-02 NOTE — Assessment & Plan Note (Signed)
bp in fair control at this time  BP Readings from Last 1 Encounters:  03/31/20 130/82   No changes needed Most recent labs reviewed  Disc lifstyle change with low sodium diet and exercise

## 2020-04-04 ENCOUNTER — Telehealth: Payer: Self-pay

## 2020-04-04 ENCOUNTER — Encounter: Payer: Self-pay | Admitting: Rheumatology

## 2020-04-04 ENCOUNTER — Other Ambulatory Visit: Payer: Self-pay

## 2020-04-04 ENCOUNTER — Ambulatory Visit (INDEPENDENT_AMBULATORY_CARE_PROVIDER_SITE_OTHER): Payer: Medicare Other | Admitting: Rheumatology

## 2020-04-04 VITALS — BP 154/79 | HR 61 | Resp 15 | Ht 59.25 in | Wt 135.6 lb

## 2020-04-04 DIAGNOSIS — E790 Hyperuricemia without signs of inflammatory arthritis and tophaceous disease: Secondary | ICD-10-CM | POA: Diagnosis not present

## 2020-04-04 DIAGNOSIS — Z860100 Personal history of colon polyps, unspecified: Secondary | ICD-10-CM

## 2020-04-04 DIAGNOSIS — M19042 Primary osteoarthritis, left hand: Secondary | ICD-10-CM | POA: Diagnosis not present

## 2020-04-04 DIAGNOSIS — Z85828 Personal history of other malignant neoplasm of skin: Secondary | ICD-10-CM

## 2020-04-04 DIAGNOSIS — M19072 Primary osteoarthritis, left ankle and foot: Secondary | ICD-10-CM

## 2020-04-04 DIAGNOSIS — Z8639 Personal history of other endocrine, nutritional and metabolic disease: Secondary | ICD-10-CM | POA: Diagnosis not present

## 2020-04-04 DIAGNOSIS — Z5181 Encounter for therapeutic drug level monitoring: Secondary | ICD-10-CM

## 2020-04-04 DIAGNOSIS — M1A0790 Idiopathic chronic gout, unspecified ankle and foot, without tophus (tophi): Secondary | ICD-10-CM

## 2020-04-04 DIAGNOSIS — I10 Essential (primary) hypertension: Secondary | ICD-10-CM | POA: Diagnosis not present

## 2020-04-04 DIAGNOSIS — M19041 Primary osteoarthritis, right hand: Secondary | ICD-10-CM

## 2020-04-04 DIAGNOSIS — Z87898 Personal history of other specified conditions: Secondary | ICD-10-CM

## 2020-04-04 DIAGNOSIS — M19071 Primary osteoarthritis, right ankle and foot: Secondary | ICD-10-CM

## 2020-04-04 DIAGNOSIS — R7303 Prediabetes: Secondary | ICD-10-CM

## 2020-04-04 NOTE — Patient Instructions (Signed)
Return in 1 month for lab work after increasing allopurinol dose.

## 2020-04-04 NOTE — Telephone Encounter (Signed)
Uric acid level was checked today. Per Dr. Estanislado Pandy, If it is not below 6.0 then I will increase allopurinol to 300 mg p.o. daily.  After increasing the dose of allopurinol we will check labs in a month which would include CBC CMP and uric acid and then every 3 months.

## 2020-04-05 ENCOUNTER — Other Ambulatory Visit: Payer: Self-pay | Admitting: *Deleted

## 2020-04-05 LAB — CBC WITH DIFFERENTIAL/PLATELET
Absolute Monocytes: 779 cells/uL (ref 200–950)
Basophils Absolute: 79 cells/uL (ref 0–200)
Basophils Relative: 1.2 %
Eosinophils Absolute: 99 cells/uL (ref 15–500)
Eosinophils Relative: 1.5 %
HCT: 39.5 % (ref 35.0–45.0)
Hemoglobin: 13.2 g/dL (ref 11.7–15.5)
Lymphs Abs: 2897 cells/uL (ref 850–3900)
MCH: 29.9 pg (ref 27.0–33.0)
MCHC: 33.4 g/dL (ref 32.0–36.0)
MCV: 89.6 fL (ref 80.0–100.0)
MPV: 11.1 fL (ref 7.5–12.5)
Monocytes Relative: 11.8 %
Neutro Abs: 2746 cells/uL (ref 1500–7800)
Neutrophils Relative %: 41.6 %
Platelets: 276 10*3/uL (ref 140–400)
RBC: 4.41 10*6/uL (ref 3.80–5.10)
RDW: 13.8 % (ref 11.0–15.0)
Total Lymphocyte: 43.9 %
WBC: 6.6 10*3/uL (ref 3.8–10.8)

## 2020-04-05 LAB — COMPLETE METABOLIC PANEL WITH GFR
AG Ratio: 1.5 (calc) (ref 1.0–2.5)
ALT: 34 U/L — ABNORMAL HIGH (ref 6–29)
AST: 32 U/L (ref 10–35)
Albumin: 4.4 g/dL (ref 3.6–5.1)
Alkaline phosphatase (APISO): 80 U/L (ref 37–153)
BUN/Creatinine Ratio: 16 (calc) (ref 6–22)
BUN: 16 mg/dL (ref 7–25)
CO2: 26 mmol/L (ref 20–32)
Calcium: 9.7 mg/dL (ref 8.6–10.4)
Chloride: 103 mmol/L (ref 98–110)
Creat: 0.99 mg/dL — ABNORMAL HIGH (ref 0.60–0.93)
GFR, Est African American: 65 mL/min/{1.73_m2} (ref >=60)
GFR, Est Non African American: 56 mL/min/{1.73_m2} — ABNORMAL LOW (ref >=60)
Globulin: 3 g/dL (calc) (ref 1.9–3.7)
Glucose, Bld: 90 mg/dL (ref 65–99)
Potassium: 3.9 mmol/L (ref 3.5–5.3)
Sodium: 139 mmol/L (ref 135–146)
Total Bilirubin: 0.5 mg/dL (ref 0.2–1.2)
Total Protein: 7.4 g/dL (ref 6.1–8.1)

## 2020-04-05 LAB — URIC ACID: Uric Acid, Serum: 4.1 mg/dL (ref 2.5–7.0)

## 2020-04-05 MED ORDER — ALLOPURINOL 100 MG PO TABS: 200.0000 mg | ORAL_TABLET | Freq: Every day | ORAL | 2 refills | Status: DC

## 2020-04-05 NOTE — Telephone Encounter (Signed)
-----   Message from Bo Merino, MD sent at 04/05/2020  8:01 AM EDT ----- Uric acid is in desirable range.  CBC is normal.GFR is low and stable.Liver functions mildly elevated.Most likely due to Lipitor.She should avoid NSAIDs.

## 2020-04-05 NOTE — Telephone Encounter (Signed)
Uric Acid on 04/04/2020: 4.1

## 2020-04-05 NOTE — Telephone Encounter (Signed)
Patient requesting refill on Allopurinol.

## 2020-04-05 NOTE — Progress Notes (Signed)
Uric acid is in desirable range.  CBC is normal.GFR is low and stable.Liver functions mildly elevated.Most likely due to Lipitor.She should avoid NSAIDs.

## 2020-04-25 NOTE — Progress Notes (Signed)
Office Visit Note  Patient: Cindy [name]             Date of Birth: Mar 18, 1946           MRN: [id number]             PCP: Abner Greenspan, MD Referring: Tower, Wynelle Fanny, MD Visit Date: 05/09/2020 Occupation: @GUAROCC @  Subjective:  Medication monitoring   History of Present Illness: Cindy [name] is a 74 y.o. female with history of osteoarthritis and gout. She is taking allopurinol 100 mg 2 tablets by mouth daily and colchicine 0.6 mg 1 tablet by mouth daily for management of gout.  She is tolerating allopurinol and colchicine as prescribed.  She denies any recent gout flares.  She reports that in the past if she had to increase the dose of colchicine during a flare she would experience significant diarrhea but has not had any diarrhea while taking it once a day.  She experiences intermittent discomfort due to trochanter bursitis of both hips.  She states that she has gone to physical therapy in the past and tries to perform home exercises on a regular basis.  She states that her discomfort is exacerbated by climbing steps.  She denies any other joint pain or joint swelling at this time.    Activities of Daily Living:  Patient reports morning stiffness for 15  minutes.   Patient Reports nocturnal pain.  Difficulty dressing/grooming: Denies Difficulty climbing stairs: Reports Difficulty getting out of chair: Denies Difficulty using hands for taps, buttons, cutlery, and/or writing: Reports  Review of Systems  Constitutional: Negative for fatigue.  HENT: Negative for mouth sores, mouth dryness and nose dryness.   Eyes: Negative for visual disturbance and dryness.  Respiratory: Negative for shortness of breath and difficulty breathing.   Cardiovascular: Negative for chest pain, palpitations and swelling in legs/feet.  Gastrointestinal: Positive for diarrhea.  Endocrine: Negative for increased urination.  Genitourinary: Negative for difficulty urinating and hematuria.    Musculoskeletal: Positive for arthralgias and joint pain. Negative for joint swelling, myalgias, muscle weakness, muscle tenderness and myalgias.  Skin: Negative for color change, rash and hair loss.  Allergic/Immunologic: Negative for susceptible to infections.  Neurological: Negative for dizziness and headaches.  Hematological: Negative for bruising/bleeding tendency.  Psychiatric/Behavioral: Negative for sleep disturbance.    PMFS History:  Patient Active Problem List   Diagnosis Date Noted  . Gout 02/02/2019  . Right hip pain 03/16/2018  . Colon cancer screening 03/11/2017  . Routine general medical examination at a health care facility 02/28/2015  . Encounter for Medicare annual wellness exam 02/03/2013  . Osteopenia 02/03/2012  . Prediabetes 10/10/2010  . COLONIC POLYPS 03/05/2010  . Hyperlipidemia 07/27/2009  . Essential hypertension 07/27/2009  . BASAL CELL CARCINOMA, HX OF 07/27/2009    Past Medical History:  Diagnosis Date  . Benign neoplasm of colon   . MVP (mitral valve prolapse)    very mild  . Other abnormal glucose   . Other and unspecified hyperlipidemia   . Personal history of other malignant neoplasm of skin   . Unspecified essential hypertension     Family History  Problem Relation Age of Onset  . Macular degeneration Mother   . Sudden death Father 26  . Heart disease Brother   . Gout Brother   . Arthritis Brother   . Healthy Daughter   . Colon cancer Neg Hx   . Esophageal cancer Neg Hx   . Rectal cancer Neg Hx   .  Stomach cancer Neg Hx    Past Surgical History:  Procedure Laterality Date  . CARDIAC CATHETERIZATION  1995   normal (after abnormal stress test)  . COLONOSCOPY  07/01/03   polyps  . MOHS SURGERY  11/09   for basal cell lesion   Social History   Social History Narrative   Retired-med tech      Married-husband has advanced parkinson's      No regular exercise   Immunization History  Administered Date(s) Administered  .  Fluad Quad(high Dose 65+) 05/25/2019  . Hepatitis B 07/03/1988, 07/30/1988, 01/01/1989  . Influenza Split 06/04/2011  . Influenza Whole 04/26/2009, 05/26/2009, 05/28/2010, 06/02/2012  . Influenza, High Dose Seasonal PF 06/17/2018  . Influenza,inj,Quad PF,6+ Mos 06/18/2017  . Influenza-Unspecified 06/01/2013, 05/27/2014, 05/26/2018  . PFIZER SARS-COV-2 Vaccination 09/05/2019, 09/26/2019  . Pneumococcal Conjugate-13 01/12/2015  . Pneumococcal Polysaccharide-23 10/24/2010  . Td 08/26/2004  . Zoster 05/07/2013     Objective: Vital Signs: BP (!) 144/79 (BP Location: Left Arm, Patient Position: Sitting, Cuff Size: Small)   Pulse 65   Resp 14   Wt 136 lb (61.7 kg)   BMI 27.24 kg/m    Physical Exam Vitals and nursing note reviewed.  Constitutional:      Appearance: She is well-developed.  HENT:     Head: Normocephalic and atraumatic.  Eyes:     Conjunctiva/sclera: Conjunctivae normal.  Pulmonary:     Effort: Pulmonary effort is normal.  Abdominal:     Palpations: Abdomen is soft.  Musculoskeletal:     Cervical back: Normal range of motion.  Lymphadenopathy:     Cervical: No cervical adenopathy.  Skin:    General: Skin is warm and dry.     Capillary Refill: Capillary refill takes less than 2 seconds.  Neurological:     Mental Status: She is alert and oriented to person, place, and time.  Psychiatric:        Behavior: Behavior normal.      Musculoskeletal Exam: C-spine, thoracic spine, lumbar spine have good range of motion.  Shoulder joints, elbow joints, wrist joints, MCPs, PIPs, DIPs have good range of motion with no synovitis.  She has mild DIP thickening consistent with osteoarthritis of both hands.  She has complete fist formation bilaterally.  Hip joints have good range of motion with no discomfort.  She has tenderness over both trochanteric bursa.  Knee joints have good range of motion with no warmth or effusion.  She has bilateral knee crepitus.  Ankle joints have good  range of motion with no tenderness or inflammation.  No tenderness of MTP joints.   CDAI Exam: CDAI Score: -- Patient Global: --; Provider Global: -- Swollen: --; Tender: -- Joint Exam 05/09/2020   No joint exam has been documented for this visit   There is currently no information documented on the homunculus. Go to the Rheumatology activity and complete the homunculus joint exam.  Investigation: No additional findings.  Imaging: No results found.  Recent Labs: Lab Results  Component Value Date   WBC 6.5 05/03/2020   HGB 12.8 05/03/2020   PLT 270 05/03/2020   NA 138 05/03/2020   K 4.2 05/03/2020   CL 103 05/03/2020   CO2 23 05/03/2020   GLUCOSE 106 (H) 05/03/2020   BUN 20 05/03/2020   CREATININE 1.04 (H) 05/03/2020   BILITOT 0.4 05/03/2020   ALKPHOS 70 03/29/2019   AST 27 05/03/2020   ALT 32 (H) 05/03/2020   PROT 6.8 05/03/2020   ALBUMIN  4.4 03/29/2019   CALCIUM 9.5 05/03/2020   GFRAA 61 05/03/2020    Speciality Comments: No specialty comments available.  Procedures:  No procedures performed Allergies: Ampicillin, Calcium, Cephalexin, and Colchicine   Assessment / Plan:     Visit Diagnoses: Chronic idiopathic gout involving toe without tophus, unspecified laterality - She has a history of episodic gout flares in her feet for the past 1.5 years prior to starting on Allopurinol.  She was started on allopurinol after her initial office visit in July 2021.  She has titrated the dose of allopurinol to 100 mg 2 tablets by mouth twice daily and is taking colchicine 0.6 mg 1 tablet by mouth daily.  According to the patient in the past she took colchicine several times a day for the management of acute gout flares which caused significant diarrhea.  She has been tolerating both medications as prescribed.  She has not had any recent gout flares.  She is not experiencing any increased joint pain and has no joint warmth or swelling on examination.  Her uric acid level was 8.7  prior to starting on allopurinol and has trended down to 3.9 on 05/03/2020.  She will continue taking allopurinol 200 mg daily and colchicine 0.6 mg 1 tablet by mouth daily.  We will recheck her uric acid level in 3 months and if she continues to be clinically doing well and her uric acid level stable we will advised her to only take colchicine as needed and continue on allopurinol as prescribed.  She will follow-up in the office in 3 months.  She was advised to notify us if she develops signs or symptoms of a gout flare before then.- Plan: COMPLETE METABOLIC PANEL WITH GFR, CBC with Differential/Platelet, Uric acid  Hyperuricemia - Uric acid was 3.9 on 05/03/2020.  She will continue taking allopurinol 100 mg 2 tablets by mouth daily.  We will recheck uric acid in 3 months.- Plan: Uric acid  Medication monitoring encounter -CBC and CMP were updated on 05/03/2020.  CBC was within normal limits.  Creatinine was 1.04 and GFR was 53.  ALT was 32.  She was advised to avoid taking NSAIDs, Tylenol, and alcohol use.  Plan: COMPLETE METABOLIC PANEL WITH GFR, CBC with Differential/Platelet  Primary osteoarthritis of both hands: She has mild DIP thickening consistent with osteoarthritis of both hands.  No tenderness or inflammation was noted on exam.  She is able to make a complete fist bilaterally.  Joint protection and muscle strengthening were discussed.  Primary osteoarthritis of both feet: She has mild osteoarthritis changes in both feet.  No joint tenderness or inflammation noted.    Other medical conditions are listed as follows:   History of alcohol use  Essential hypertension  History of hyperlipidemia  Hx of colonic polyps  Prediabetes  History of basal cell carcinoma  Orders: Orders Placed This Encounter  Procedures  . COMPLETE METABOLIC PANEL WITH GFR  . CBC with Differential/Platelet  . Uric acid   No orders of the defined types were placed in this encounter.   Follow-Up Instructions:  Return in about 3 months (around 08/08/2020) for Gout, Osteoarthritis.   Ofilia Neas, PA-C  Note - This record has been created using Dragon software.  Chart creation errors have been sought, but may not always  have been located. Such creation errors do not reflect on  the standard of medical care.

## 2020-05-03 ENCOUNTER — Other Ambulatory Visit: Payer: Self-pay | Admitting: Rheumatology

## 2020-05-03 ENCOUNTER — Other Ambulatory Visit: Payer: Self-pay

## 2020-05-03 DIAGNOSIS — Z5181 Encounter for therapeutic drug level monitoring: Secondary | ICD-10-CM

## 2020-05-03 DIAGNOSIS — M1A0790 Idiopathic chronic gout, unspecified ankle and foot, without tophus (tophi): Secondary | ICD-10-CM | POA: Diagnosis not present

## 2020-05-03 MED ORDER — COLCHICINE 0.6 MG PO TABS: 0.6000 mg | ORAL_TABLET | Freq: Every day | ORAL | 1 refills | Status: DC

## 2020-05-03 NOTE — Telephone Encounter (Signed)
Patient requested prescription refill of Colchicine to be sent to [facility] in [place].

## 2020-05-03 NOTE — Telephone Encounter (Signed)
Last Visit: 04/04/2020 Next Visit: 05/09/2020 Labs: 04/04/2020 Uric acid is in desirable range. CBC is normal.GFR is low and stable.Liver functions mildly elevated  Current Dose per office note 04/04/2020: colchicine 0.6 mg once a day  DX: Chronic idiopathic gout involving toe without tophus, unspecified laterality  Okay to refill Colchicine?

## 2020-05-04 LAB — CBC WITH DIFFERENTIAL/PLATELET
Absolute Monocytes: 624 cells/uL (ref 200–950)
Basophils Absolute: 52 cells/uL (ref 0–200)
Basophils Relative: 0.8 %
Eosinophils Absolute: 72 cells/uL (ref 15–500)
Eosinophils Relative: 1.1 %
HCT: 38.6 % (ref 35.0–45.0)
Hemoglobin: 12.8 g/dL (ref 11.7–15.5)
Lymphs Abs: 2477 cells/uL (ref 850–3900)
MCH: 30 pg (ref 27.0–33.0)
MCHC: 33.2 g/dL (ref 32.0–36.0)
MCV: 90.6 fL (ref 80.0–100.0)
MPV: 11.2 fL (ref 7.5–12.5)
Monocytes Relative: 9.6 %
Neutro Abs: 3276 cells/uL (ref 1500–7800)
Neutrophils Relative %: 50.4 %
Platelets: 270 10*3/uL (ref 140–400)
RBC: 4.26 10*6/uL (ref 3.80–5.10)
RDW: 14.2 % (ref 11.0–15.0)
Total Lymphocyte: 38.1 %
WBC: 6.5 10*3/uL (ref 3.8–10.8)

## 2020-05-04 LAB — COMPLETE METABOLIC PANEL WITH GFR
AG Ratio: 1.6 (calc) (ref 1.0–2.5)
ALT: 32 U/L — ABNORMAL HIGH (ref 6–29)
AST: 27 U/L (ref 10–35)
Albumin: 4.2 g/dL (ref 3.6–5.1)
Alkaline phosphatase (APISO): 73 U/L (ref 37–153)
BUN/Creatinine Ratio: 19 (calc) (ref 6–22)
BUN: 20 mg/dL (ref 7–25)
CO2: 23 mmol/L (ref 20–32)
Calcium: 9.5 mg/dL (ref 8.6–10.4)
Chloride: 103 mmol/L (ref 98–110)
Creat: 1.04 mg/dL — ABNORMAL HIGH (ref 0.60–0.93)
GFR, Est African American: 61 mL/min/{1.73_m2} (ref >=60)
GFR, Est Non African American: 53 mL/min/{1.73_m2} — ABNORMAL LOW (ref >=60)
Globulin: 2.6 g/dL (calc) (ref 1.9–3.7)
Glucose, Bld: 106 mg/dL — ABNORMAL HIGH (ref 65–99)
Potassium: 4.2 mmol/L (ref 3.5–5.3)
Sodium: 138 mmol/L (ref 135–146)
Total Bilirubin: 0.4 mg/dL (ref 0.2–1.2)
Total Protein: 6.8 g/dL (ref 6.1–8.1)

## 2020-05-04 LAB — URIC ACID: Uric Acid, Serum: 3.9 mg/dL (ref 2.5–7.0)

## 2020-05-04 NOTE — Progress Notes (Signed)
Uric acid is in desirable range.  GFR is low and stable.  LFTs are mildly elevated.  She is on statins and ACE inhibitor's which could be contributing to her abnormal labs.

## 2020-05-09 ENCOUNTER — Other Ambulatory Visit: Payer: Self-pay

## 2020-05-09 ENCOUNTER — Encounter: Payer: Self-pay | Admitting: Physician Assistant

## 2020-05-09 ENCOUNTER — Ambulatory Visit (INDEPENDENT_AMBULATORY_CARE_PROVIDER_SITE_OTHER): Payer: Medicare Other | Admitting: Physician Assistant

## 2020-05-09 VITALS — BP 144/79 | HR 65 | Resp 14 | Wt 136.0 lb

## 2020-05-09 DIAGNOSIS — Z85828 Personal history of other malignant neoplasm of skin: Secondary | ICD-10-CM | POA: Diagnosis not present

## 2020-05-09 DIAGNOSIS — Z8639 Personal history of other endocrine, nutritional and metabolic disease: Secondary | ICD-10-CM | POA: Diagnosis not present

## 2020-05-09 DIAGNOSIS — E790 Hyperuricemia without signs of inflammatory arthritis and tophaceous disease: Secondary | ICD-10-CM

## 2020-05-09 DIAGNOSIS — Z5181 Encounter for therapeutic drug level monitoring: Secondary | ICD-10-CM | POA: Diagnosis not present

## 2020-05-09 DIAGNOSIS — M1A0790 Idiopathic chronic gout, unspecified ankle and foot, without tophus (tophi): Secondary | ICD-10-CM

## 2020-05-09 DIAGNOSIS — M19072 Primary osteoarthritis, left ankle and foot: Secondary | ICD-10-CM

## 2020-05-09 DIAGNOSIS — M19042 Primary osteoarthritis, left hand: Secondary | ICD-10-CM | POA: Diagnosis not present

## 2020-05-09 DIAGNOSIS — M19071 Primary osteoarthritis, right ankle and foot: Secondary | ICD-10-CM | POA: Diagnosis not present

## 2020-05-09 DIAGNOSIS — Z87898 Personal history of other specified conditions: Secondary | ICD-10-CM | POA: Diagnosis not present

## 2020-05-09 DIAGNOSIS — R7303 Prediabetes: Secondary | ICD-10-CM

## 2020-05-09 DIAGNOSIS — Z86010 Personal history of colonic polyps: Secondary | ICD-10-CM | POA: Diagnosis not present

## 2020-05-09 DIAGNOSIS — M19041 Primary osteoarthritis, right hand: Secondary | ICD-10-CM | POA: Diagnosis not present

## 2020-05-09 DIAGNOSIS — I10 Essential (primary) hypertension: Secondary | ICD-10-CM | POA: Diagnosis not present

## 2020-05-09 NOTE — Patient Instructions (Signed)
Standing Labs We placed an order today for your standing lab work.   Please have your standing labs drawn in December  If possible, please have your labs drawn 2 weeks prior to your appointment so that the provider can discuss your results at your appointment.  We have open lab daily Monday through Thursday from 8:30-12:30 PM and 1:30-4:30 PM and Friday from 8:30-12:30 PM and 1:30-4:00 PM at the office of Dr. Shaili Deveshwar, [facility].   Please be advised, patients with office appointments requiring lab work will take precedents over walk-in lab work.  If possible, please come for your lab work on Monday and Friday afternoons, as you may experience shorter wait times. The office is located at [address], [address], [place], [place] [address] No appointment is necessary.   Labs are drawn by [facility]. Please bring your co-pay at the time of your lab draw.  You may receive a bill from [facility] for your lab work.  If you wish to have your labs drawn at another location, please call the office 24 hours in advance to send orders.  If you have any questions regarding directions or hours of operation,  please call [phone].   As a reminder, please drink plenty of water prior to coming for your lab work. Thanks!  

## 2020-05-30 ENCOUNTER — Other Ambulatory Visit: Payer: Self-pay | Admitting: Internal Medicine

## 2020-05-30 ENCOUNTER — Ambulatory Visit: Payer: Medicare Other | Attending: Internal Medicine

## 2020-05-30 DIAGNOSIS — Z23 Encounter for immunization: Secondary | ICD-10-CM

## 2020-05-30 NOTE — Progress Notes (Signed)
   Covid-19 Vaccination Clinic  Name:  Cindy [name]    MRN: [id number] DOB: [birth year]  05/30/2020  Cindy [name] was observed post Covid-19 immunization for 15 minutes without incident. She was provided with Vaccine Information Sheet and instruction to access the V-Safe system.   Cindy [name] was instructed to call 911 with any severe reactions post vaccine: Marland Kitchen Difficulty breathing  . Swelling of face and throat  . A fast heartbeat  . A bad rash all over body  . Dizziness and weakness

## 2020-06-19 DIAGNOSIS — Z23 Encounter for immunization: Secondary | ICD-10-CM | POA: Diagnosis not present

## 2020-07-03 ENCOUNTER — Other Ambulatory Visit: Payer: Self-pay | Admitting: Rheumatology

## 2020-07-03 MED ORDER — COLCHICINE 0.6 MG PO TABS
0.6000 mg | ORAL_TABLET | Freq: Every day | ORAL | 0 refills | Status: DC
Start: 2020-07-03 — End: 2021-07-04

## 2020-07-03 NOTE — Telephone Encounter (Signed)
Last Visit: 05/09/2020 Next Visit: 09/05/2020 Labs: 05/09/2020 Uric acid is in desirable range. GFR is low and stable. LFTs are mildly elevated.  Current Dose per office note 05/09/2020: allopurinol 100 mg 2 tablets by mouth daily.  DX: Hyperuricemia  Spoke with patient and verified that she is taking Allopurinol 200 mg daily.  Okay to refill Allopurinol and Colchicine?

## 2020-08-23 NOTE — Progress Notes (Signed)
Office Visit Note  Patient: Cindy [name]             Date of Birth: 09/13/1945           MRN: [id number]             PCP: Abner Greenspan, MD Referring: Tower, Wynelle Fanny, MD Visit Date: 09/05/2020 Occupation: @GUAROCC @  Subjective:  Discuss medications   History of Present Illness: Cindy [name] is a 74 y.o. female with history of gout.  Patient is taking allopurinol 200 mg by mouth daily and colchicine 0.6 mg 1 tablet by mouth daily for management of gout.  She is tolerating both medications without any side effects.  She has not had any recent gout flares.  She reports that her last gout flare was in March 2021.  She would like to discuss discontinuing colchicine.  Patient reports that she has intermittent discomfort due to trochanter bursitis in both hips.  She states her pain is exacerbated by laying on her sides at night.  She states overall she has been sleeping well.  She has occasional arthralgias and joint stiffness but denies any joint swelling at this time.    Activities of Daily Living:  Patient reports morning stiffness for 20 minutes.   Patient Reports nocturnal pain.  Difficulty dressing/grooming: Denies Difficulty climbing stairs: Reports Difficulty getting out of chair: Denies Difficulty using hands for taps, buttons, cutlery, and/or writing: Denies  Review of Systems  Constitutional: Negative for fatigue.  HENT: Negative for mouth dryness.   Eyes: Negative for dryness.  Respiratory: Negative for shortness of breath.   Cardiovascular: Negative for swelling in legs/feet.  Gastrointestinal: Positive for diarrhea.  Endocrine: Negative for cold intolerance.  Genitourinary: Negative for difficulty urinating.  Musculoskeletal: Positive for arthralgias, joint pain and morning stiffness.  Skin: Negative for rash.  Allergic/Immunologic: Negative for susceptible to infections.  Neurological: Negative for numbness.  Hematological: Negative for bruising/bleeding  tendency.  Psychiatric/Behavioral: Negative for sleep disturbance.    PMFS History:  Patient Active Problem List   Diagnosis Date Noted  . Gout 02/02/2019  . Right hip pain 03/16/2018  . Colon cancer screening 03/11/2017  . Routine general medical examination at a health care facility 02/28/2015  . Encounter for Medicare annual wellness exam 02/03/2013  . Osteopenia 02/03/2012  . Prediabetes 10/10/2010  . COLONIC POLYPS 03/05/2010  . Hyperlipidemia 07/27/2009  . Essential hypertension 07/27/2009  . BASAL CELL CARCINOMA, HX OF 07/27/2009    Past Medical History:  Diagnosis Date  . Benign neoplasm of colon   . Gout   . MVP (mitral valve prolapse)    very mild  . Other abnormal glucose   . Other and unspecified hyperlipidemia   . Personal history of other malignant neoplasm of skin   . Unspecified essential hypertension     Family History  Problem Relation Age of Onset  . Macular degeneration Mother   . Sudden death Father 6  . Heart disease Brother   . Gout Brother   . Arthritis Brother   . Healthy Daughter   . Colon cancer Neg Hx   . Esophageal cancer Neg Hx   . Rectal cancer Neg Hx   . Stomach cancer Neg Hx    Past Surgical History:  Procedure Laterality Date  . CARDIAC CATHETERIZATION  1995   normal (after abnormal stress test)  . COLONOSCOPY  07/01/03   polyps  . MOHS SURGERY  11/09   for basal cell lesion   Social History  Social History Narrative   Retired-med tech      Married-husband has advanced parkinson's      No regular exercise   Immunization History  Administered Date(s) Administered  . Fluad Quad(high Dose 65+) 05/25/2019  . Hepatitis B 07/03/1988, 07/30/1988, 01/01/1989  . Influenza Split 06/04/2011  . Influenza Whole 04/26/2009, 05/26/2009, 05/28/2010, 06/02/2012  . Influenza, High Dose Seasonal PF 06/17/2018  . Influenza,inj,Quad PF,6+ Mos 06/18/2017  . Influenza-Unspecified 06/01/2013, 05/27/2014, 05/26/2018  . PFIZER SARS-COV-2  Vaccination 09/05/2019, 09/26/2019, 05/30/2020  . Pneumococcal Conjugate-13 01/12/2015  . Pneumococcal Polysaccharide-23 10/24/2010  . Td 08/26/2004  . Zoster 05/07/2013     Objective: Vital Signs: BP (!) 166/75 (BP Location: Left Arm, Patient Position: Sitting, Cuff Size: Normal)   Pulse 65   Resp 17   Ht 5' (1.524 m)   Wt 138 lb (62.6 kg)   BMI 26.95 kg/m    Physical Exam Vitals and nursing note reviewed.  Constitutional:      Appearance: She is well-developed and well-nourished.  HENT:     Head: Normocephalic and atraumatic.  Eyes:     Extraocular Movements: EOM normal.     Conjunctiva/sclera: Conjunctivae normal.  Cardiovascular:     Pulses: Intact distal pulses.  Pulmonary:     Effort: Pulmonary effort is normal.  Abdominal:     Palpations: Abdomen is soft.  Musculoskeletal:     Cervical back: Normal range of motion.  Skin:    General: Skin is warm and dry.     Capillary Refill: Capillary refill takes less than 2 seconds.  Neurological:     Mental Status: She is alert and oriented to person, place, and time.  Psychiatric:        Mood and Affect: Mood and affect normal.        Behavior: Behavior normal.      Musculoskeletal Exam: C-spine, thoracic spine, lumbar spine good range of motion with no discomfort.  No midline spinal tenderness.  No SI joint tenderness.  Shoulder joints, elbow joints, wrist joints, MCPs, PIPs, DIPs have good range of motion with no synovitis.  PIP and DIP thickening consistent with osteoarthritis of both hands noted.  She is able to make a complete fist bilaterally.  Hip joints have slightly limited range of motion with no discomfort.  Tenderness over trochanteric bursa of both hips and IT bands.  Knee joints have good range of motion with no warmth or effusion.  Ankle joints have good range of motion with no tenderness or inflammation.  No tenderness of MTP joints  CDAI Exam: CDAI Score: -- Patient Global: --; Provider Global:  -- Swollen: --; Tender: -- Joint Exam 09/05/2020   No joint exam has been documented for this visit   There is currently no information documented on the homunculus. Go to the Rheumatology activity and complete the homunculus joint exam.  Investigation: No additional findings.  Imaging: No results found.  Recent Labs: Lab Results  Component Value Date   WBC 8.5 08/30/2020   HGB 12.6 08/30/2020   PLT 339 08/30/2020   NA 138 08/30/2020   K 4.6 08/30/2020   CL 101 08/30/2020   CO2 28 08/30/2020   GLUCOSE 96 08/30/2020   BUN 18 08/30/2020   CREATININE 0.87 08/30/2020   BILITOT 0.4 08/30/2020   ALKPHOS 70 03/29/2019   AST 30 08/30/2020   ALT 38 (H) 08/30/2020   PROT 7.0 08/30/2020   ALBUMIN 4.4 03/29/2019   CALCIUM 9.6 08/30/2020   GFRAA 76 08/30/2020  Speciality Comments: No specialty comments available.  Procedures:  No procedures performed Allergies: Ampicillin, Calcium, Cephalexin, and Colchicine   Assessment / Plan:     Visit Diagnoses: Chronic idiopathic gout involving toe without tophus, unspecified laterality - She has not had any signs or symptoms of a gout flare recently.  She has clinically been doing well on allopurinol 100 mg 2 tablets by mouth daily and colchicine 0.6 mg 1 tablet by mouth daily.  She has been tolerating both medications without any side effects.  According to the patient her last flare was in March 2021.  Prior to starting on allopurinol she had episodic flares in her feet over about 1-1/2 years.  She has not had any recurrence of symptoms since starting on allopurinol and colchicine.  Her uric acid level was 4.1 on 08/30/2020.  She will continue taking allopurinol as prescribed.  We discussed the importance of avoiding a purine rich diet, high fructose corn syrup, and alcohol use.  She will start to only take colchicine 0.6 mg 1 tablet by mouth daily as needed during gout flares.  She was advised to notify us if she develops signs or symptoms of  a gout flare.  She will follow-up in the office in 6 months.  Hyperuricemia: Uric acid was 4.1 on 08/30/2020.  She will continue taking allopurinol 100 mg 2 tablets by mouth daily.  Medication monitoring encounter - Allopurinol 100 mg 2 tablets by mouth daily, colchicine 0.6mg  1 tablet by mouth daily as needed.  Uric acid was 4.1 on 08/30/2020.  CBC and CMP were updated on 08/30/2020 and were reviewed with the patient today in the office.  History of alcohol use: No alcohol use recently.  Primary osteoarthritis of both hands: She has PIP and DIP thickening consistent with osteoarthritis of both hands.  No tenderness or inflammation was noted.  She is able to make a complete fist bilaterally.  Primary osteoarthritis of both feet - She has mild osteoarthritis changes in both feet.  She has good range of motion of both ankle joints with no discomfort.  No tenderness or inflammation of ankles noted.  No tenderness of MTP joints.  She has not been experiencing any discomfort in her feet at this time.  We discussed the importance of wearing proper fitting shoes.  Trochanteric bursitis of both hips: She experiences intermittent discomfort due to trochanter bursitis of both hips.  Her discomfort is typically exacerbated by laying on her sides at night or climbing steps.  She was encouraged to start performing stretching exercises on a daily basis.  She has a handout of these exercises at home.  She has also been a physical therapy in the past.  Other medical conditions are listed as follows:  Essential hypertension  Hx of colonic polyps  History of hyperlipidemia  History of basal cell carcinoma  Prediabetes  Orders: No orders of the defined types were placed in this encounter.  No orders of the defined types were placed in this encounter.   Follow-Up Instructions: Return in about 6 months (around 03/05/2021) for Gout.   Gearldine Bienenstock, PA-C  Note - This record has been created using Dragon  software.  Chart creation errors have been sought, but may not always  have been located. Such creation errors do not reflect on  the standard of medical care.

## 2020-08-30 ENCOUNTER — Other Ambulatory Visit: Payer: Self-pay | Admitting: *Deleted

## 2020-08-30 DIAGNOSIS — M1A0790 Idiopathic chronic gout, unspecified ankle and foot, without tophus (tophi): Secondary | ICD-10-CM | POA: Diagnosis not present

## 2020-08-30 DIAGNOSIS — Z5181 Encounter for therapeutic drug level monitoring: Secondary | ICD-10-CM

## 2020-08-30 DIAGNOSIS — E790 Hyperuricemia without signs of inflammatory arthritis and tophaceous disease: Secondary | ICD-10-CM

## 2020-08-31 LAB — CBC WITH DIFFERENTIAL/PLATELET
Absolute Monocytes: 859 cells/uL (ref 200–950)
Basophils Absolute: 68 cells/uL (ref 0–200)
Basophils Relative: 0.8 %
Eosinophils Absolute: 77 cells/uL (ref 15–500)
Eosinophils Relative: 0.9 %
HCT: 37.1 % (ref 35.0–45.0)
Hemoglobin: 12.6 g/dL (ref 11.7–15.5)
Lymphs Abs: 2958 cells/uL (ref 850–3900)
MCH: 31.6 pg (ref 27.0–33.0)
MCHC: 34 g/dL (ref 32.0–36.0)
MCV: 93 fL (ref 80.0–100.0)
MPV: 10.7 fL (ref 7.5–12.5)
Monocytes Relative: 10.1 %
Neutro Abs: 4539 cells/uL (ref 1500–7800)
Neutrophils Relative %: 53.4 %
Platelets: 339 10*3/uL (ref 140–400)
RBC: 3.99 10*6/uL (ref 3.80–5.10)
RDW: 14.4 % (ref 11.0–15.0)
Total Lymphocyte: 34.8 %
WBC: 8.5 10*3/uL (ref 3.8–10.8)

## 2020-08-31 LAB — COMPLETE METABOLIC PANEL WITH GFR
AG Ratio: 1.8 (calc) (ref 1.0–2.5)
ALT: 38 U/L — ABNORMAL HIGH (ref 6–29)
AST: 30 U/L (ref 10–35)
Albumin: 4.5 g/dL (ref 3.6–5.1)
Alkaline phosphatase (APISO): 78 U/L (ref 37–153)
BUN: 18 mg/dL (ref 7–25)
CO2: 28 mmol/L (ref 20–32)
Calcium: 9.6 mg/dL (ref 8.6–10.4)
Chloride: 101 mmol/L (ref 98–110)
Creat: 0.87 mg/dL (ref 0.60–0.93)
GFR, Est African American: 76 mL/min/{1.73_m2} (ref >=60)
GFR, Est Non African American: 65 mL/min/{1.73_m2} (ref >=60)
Globulin: 2.5 g/dL (calc) (ref 1.9–3.7)
Glucose, Bld: 96 mg/dL (ref 65–99)
Potassium: 4.6 mmol/L (ref 3.5–5.3)
Sodium: 138 mmol/L (ref 135–146)
Total Bilirubin: 0.4 mg/dL (ref 0.2–1.2)
Total Protein: 7 g/dL (ref 6.1–8.1)

## 2020-08-31 LAB — URIC ACID: Uric Acid, Serum: 4.1 mg/dL (ref 2.5–7.0)

## 2020-08-31 NOTE — Progress Notes (Signed)
Uric acid is within the desirable range. CBC WNL. ALT mildly elevated. AST WNL.  Please advise the patient to avoid tylenol, NSAID use, and alcohol use. We will continue to monitor.

## 2020-09-05 ENCOUNTER — Other Ambulatory Visit: Payer: Self-pay

## 2020-09-05 ENCOUNTER — Ambulatory Visit (INDEPENDENT_AMBULATORY_CARE_PROVIDER_SITE_OTHER): Payer: Medicare Other | Admitting: Physician Assistant

## 2020-09-05 ENCOUNTER — Encounter: Payer: Self-pay | Admitting: Physician Assistant

## 2020-09-05 VITALS — BP 166/75 | HR 65 | Resp 17 | Ht 60.0 in | Wt 138.0 lb

## 2020-09-05 DIAGNOSIS — I10 Essential (primary) hypertension: Secondary | ICD-10-CM

## 2020-09-05 DIAGNOSIS — M1A0790 Idiopathic chronic gout, unspecified ankle and foot, without tophus (tophi): Secondary | ICD-10-CM | POA: Diagnosis not present

## 2020-09-05 DIAGNOSIS — Z86010 Personal history of colonic polyps: Secondary | ICD-10-CM

## 2020-09-05 DIAGNOSIS — Z5181 Encounter for therapeutic drug level monitoring: Secondary | ICD-10-CM

## 2020-09-05 DIAGNOSIS — Z8639 Personal history of other endocrine, nutritional and metabolic disease: Secondary | ICD-10-CM

## 2020-09-05 DIAGNOSIS — M19071 Primary osteoarthritis, right ankle and foot: Secondary | ICD-10-CM

## 2020-09-05 DIAGNOSIS — M19041 Primary osteoarthritis, right hand: Secondary | ICD-10-CM

## 2020-09-05 DIAGNOSIS — R7303 Prediabetes: Secondary | ICD-10-CM

## 2020-09-05 DIAGNOSIS — M19042 Primary osteoarthritis, left hand: Secondary | ICD-10-CM

## 2020-09-05 DIAGNOSIS — M19072 Primary osteoarthritis, left ankle and foot: Secondary | ICD-10-CM

## 2020-09-05 DIAGNOSIS — Z87898 Personal history of other specified conditions: Secondary | ICD-10-CM

## 2020-09-05 DIAGNOSIS — M7061 Trochanteric bursitis, right hip: Secondary | ICD-10-CM

## 2020-09-05 DIAGNOSIS — E790 Hyperuricemia without signs of inflammatory arthritis and tophaceous disease: Secondary | ICD-10-CM | POA: Diagnosis not present

## 2020-09-05 DIAGNOSIS — M7062 Trochanteric bursitis, left hip: Secondary | ICD-10-CM

## 2020-09-05 DIAGNOSIS — Z85828 Personal history of other malignant neoplasm of skin: Secondary | ICD-10-CM

## 2020-10-01 ENCOUNTER — Other Ambulatory Visit: Payer: Self-pay | Admitting: Physician Assistant

## 2020-10-02 NOTE — Telephone Encounter (Signed)
Last Visit: 09/05/2020 Next Visit: 03/06/2021 Labs: 08/30/2020, Uric acid is within the desirable range. CBC WNL. ALT mildly elevated. AST WNL. Please advise the patient to avoid tylenol, NSAID use, and alcohol use. We will continue to monitor.   Current Dose per office note 09/05/2020, allopurinol 100 mg 2 tablets by mouth daily  DX: Chronic idiopathic gout involving toe without tophus, unspecified laterality   Okay to refill Allopurinol?

## 2020-10-23 ENCOUNTER — Other Ambulatory Visit: Payer: Self-pay | Admitting: Family Medicine

## 2020-10-23 DIAGNOSIS — Z1231 Encounter for screening mammogram for malignant neoplasm of breast: Secondary | ICD-10-CM

## 2020-11-29 ENCOUNTER — Other Ambulatory Visit: Payer: Self-pay

## 2020-11-29 ENCOUNTER — Ambulatory Visit
Admission: RE | Admit: 2020-11-29 | Discharge: 2020-11-29 | Disposition: A | Discharge status: Home / Self Care | Payer: Medicare Other | Source: Ambulatory Visit

## 2020-11-29 DIAGNOSIS — Z1231 Encounter for screening mammogram for malignant neoplasm of breast: Secondary | ICD-10-CM | POA: Diagnosis not present

## 2020-12-01 ENCOUNTER — Ambulatory Visit: Payer: Medicare Other | Attending: Internal Medicine

## 2020-12-01 DIAGNOSIS — Z23 Encounter for immunization: Secondary | ICD-10-CM

## 2020-12-01 NOTE — Progress Notes (Signed)
   Covid-19 Vaccination Clinic  Name:  Cindy [name]    MRN: [id number] DOB: 08/09/1946  12/01/2020  [name] was observed post Covid-19 immunization for 15 minutes without incident. She was provided with Vaccine Information Sheet and instruction to access the V-Safe system.   [name] was instructed to call 911 with any severe reactions post vaccine: Marland Kitchen Difficulty breathing  . Swelling of face and throat  . A fast heartbeat  . A bad rash all over body  . Dizziness and weakness   Immunizations Administered    Name Date Dose VIS Date Route   PFIZER Comrnaty(Gray TOP) Covid-19 Vaccine 12/01/2020 11:44 AM 0.3 mL 08/03/2020 Intramuscular   Manufacturer: [employer], [employer]   Lot: [id number]   [place]: [phone]

## 2020-12-06 ENCOUNTER — Other Ambulatory Visit: Payer: Self-pay

## 2020-12-06 MED ORDER — PFIZER-BIONT COVID-19 VAC-TRIS 30 MCG/0.3ML IM SUSP
INTRAMUSCULAR | 0 refills | Status: DC
  Filled 2020-12-06: qty 0.3, 20d supply, fill #0

## 2020-12-08 ENCOUNTER — Other Ambulatory Visit: Payer: Self-pay

## 2020-12-20 ENCOUNTER — Other Ambulatory Visit: Payer: Self-pay | Admitting: Physician Assistant

## 2020-12-20 NOTE — Telephone Encounter (Signed)
Next Visit: 03/06/2021  Last Visit: 09/05/2020  Last Fill: 10/02/2020   DX: Chronic idiopathic gout involving toe without tophus, unspecified laterality   Current Dose per office note 09/05/2020, Allopurinol 100 mg 2 tablets by mouth daily  Labs: 08/30/2020, Uric acid is within the desirable range. CBC WNL. ALT mildly elevated. AST WNL. Please advise the patient to avoid tylenol, NSAID use, and alcohol use. We will continue to monitor.   Okay to refill Allopurinol?

## 2021-02-21 NOTE — Progress Notes (Signed)
Office Visit Note  Patient: Cindy [name]             Date of Birth: Nov 08, 1945           MRN: [id number]             PCP: Abner Greenspan, MD Referring: Tower, Wynelle Fanny, MD Visit Date: 03/06/2021 Occupation: @GUAROCC @  Subjective:  Medication monitoring.   History of Present Illness: Cindy [name] is a 75 y.o. female with a history of osteoarthritis and gouty arthropathy.  She denies having any gout flares since she started taking allopurinol.  She has occasional twinges in her foot which resolves by itself.  She continues to have some stiffness and discomfort in her hands and feet due to underlying osteoarthritis.  She has had chronic lower back pain for a while which is a still bothering her.  Activities of Daily Living:  Patient reports morning stiffness for 15-20 minutes.   Patient Reports nocturnal pain.  Difficulty dressing/grooming: Denies Difficulty climbing stairs: Reports Difficulty getting out of chair: Denies Difficulty using hands for taps, buttons, cutlery, and/or writing: Denies  Review of Systems  Constitutional:  Negative for fatigue.  HENT:  Negative for mouth sores, mouth dryness and nose dryness.   Eyes:  Negative for pain, itching and dryness.  Respiratory:  Negative for shortness of breath and difficulty breathing.   Cardiovascular:  Negative for chest pain and palpitations.  Gastrointestinal:  Negative for blood in stool, constipation and diarrhea.  Endocrine: Negative for increased urination.  Genitourinary:  Negative for difficulty urinating.  Musculoskeletal:  Positive for joint pain, joint pain and morning stiffness. Negative for joint swelling, myalgias, muscle tenderness and myalgias.  Skin:  Negative for color change, rash and redness.  Allergic/Immunologic: Negative for susceptible to infections.  Neurological:  Negative for dizziness, numbness, headaches, memory loss and weakness.  Hematological:  Negative for bruising/bleeding tendency.   Psychiatric/Behavioral:  Negative for confusion.    PMFS History:  Patient Active Problem List   Diagnosis Date Noted   Gout 02/02/2019   Right hip pain 03/16/2018   Colon cancer screening 03/11/2017   Routine general medical examination at a health care facility 02/28/2015   Encounter for Medicare annual wellness exam 02/03/2013   Osteopenia 02/03/2012   Prediabetes 10/10/2010   COLONIC POLYPS 03/05/2010   Hyperlipidemia 07/27/2009   Essential hypertension 07/27/2009   BASAL CELL CARCINOMA, HX OF 07/27/2009    Past Medical History:  Diagnosis Date   Benign neoplasm of colon    Gout    MVP (mitral valve prolapse)    very mild   Other abnormal glucose    Other and unspecified hyperlipidemia    Personal history of other malignant neoplasm of skin    Unspecified essential hypertension     Family History  Problem Relation Age of Onset   Macular degeneration Mother    Sudden death Father 29   Heart disease Brother    Gout Brother    Arthritis Brother    Healthy Daughter    Colon cancer Neg Hx    Esophageal cancer Neg Hx    Rectal cancer Neg Hx    Stomach cancer Neg Hx    Past Surgical History:  Procedure Laterality Date   CARDIAC CATHETERIZATION  1995   normal (after abnormal stress test)   COLONOSCOPY  07/01/03   polyps   MOHS SURGERY  11/09   for basal cell lesion   Social History   Social History Narrative  Retired-med tech      Married-husband has advanced parkinson's      No regular exercise   Immunization History  Administered Date(s) Administered   Fluad Quad(high Dose 65+) 05/25/2019   Hepatitis B 07/03/1988, 07/30/1988, 01/01/1989   Influenza Split 06/04/2011   Influenza Whole 04/26/2009, 05/26/2009, 05/28/2010, 06/02/2012   Influenza, High Dose Seasonal PF 06/17/2018   Influenza,inj,Quad PF,6+ Mos 06/18/2017   Influenza-Unspecified 06/01/2013, 05/27/2014, 05/26/2018   PFIZER Comirnaty(Gray Top)Covid-19 Tri-Sucrose Vaccine 12/01/2020    PFIZER(Purple Top)SARS-COV-2 Vaccination 09/05/2019, 09/26/2019, 05/30/2020   Pneumococcal Conjugate-13 01/12/2015   Pneumococcal Polysaccharide-23 10/24/2010   Td 08/26/2004   Zoster, Live 05/07/2013     Objective: Vital Signs: BP (!) 149/81 (BP Location: Left Arm, Patient Position: Sitting, Cuff Size: Normal)   Pulse (!) 58   Ht 5' (1.524 m)   Wt 141 lb 12.8 oz (64.3 kg)   BMI 27.69 kg/m    Physical Exam Vitals and nursing note reviewed.  Constitutional:      Appearance: She is well-developed.  HENT:     Head: Normocephalic and atraumatic.  Eyes:     Conjunctiva/sclera: Conjunctivae normal.  Cardiovascular:     Rate and Rhythm: Normal rate and regular rhythm.     Heart sounds: Normal heart sounds.  Pulmonary:     Effort: Pulmonary effort is normal.     Breath sounds: Normal breath sounds.  Abdominal:     General: Bowel sounds are normal.     Palpations: Abdomen is soft.  Musculoskeletal:     Cervical back: Normal range of motion.  Lymphadenopathy:     Cervical: No cervical adenopathy.  Skin:    General: Skin is warm and dry.     Capillary Refill: Capillary refill takes less than 2 seconds.  Neurological:     Mental Status: She is alert and oriented to person, place, and time.  Psychiatric:        Behavior: Behavior normal.     Musculoskeletal Exam: C-spine was in good range of motion.  Shoulder joints, elbow joints, wrist joints with good range of motion.  She had PIP and DIP thickening with no synovitis.  Hip joints, knee joints, ankles with good range of motion.  There was no tenderness over MTPs or PIPs.  CDAI Exam: CDAI Score: -- Patient Global: --; Provider Global: -- Swollen: --; Tender: -- Joint Exam 03/06/2021   No joint exam has been documented for this visit   There is currently no information documented on the homunculus. Go to the Rheumatology activity and complete the homunculus joint exam.  Investigation: No additional  findings.  Imaging: No results found.  Recent Labs: Lab Results  Component Value Date   WBC 8.5 08/30/2020   HGB 12.6 08/30/2020   PLT 339 08/30/2020   NA 138 08/30/2020   K 4.6 08/30/2020   CL 101 08/30/2020   CO2 28 08/30/2020   GLUCOSE 96 08/30/2020   BUN 18 08/30/2020   CREATININE 0.87 08/30/2020   BILITOT 0.4 08/30/2020   ALKPHOS 70 03/29/2019   AST 30 08/30/2020   ALT 38 (H) 08/30/2020   PROT 7.0 08/30/2020   ALBUMIN 4.4 03/29/2019   CALCIUM 9.6 08/30/2020   GFRAA 76 08/30/2020    Speciality Comments: No specialty comments available.  Procedures:  No procedures performed Allergies: Ampicillin, Calcium, Cephalexin, and Colchicine   Assessment / Plan:     Visit Diagnoses: Chronic idiopathic gout involving toe without tophus, unspecified laterality-patient has not had any flares of gout since  she started allopurinol.  She states she has occasional twinges in her feet but has not had a flare in a long time.  She has been tolerating allopurinol well.  She did not have to take colchicine.  Hyperuricemia - Uric acid was 4.1 on 08/30/2020.   Medication monitoring encounter - Allopurinol 100 mg 2 tablets by mouth daily, colchicine 0.6mg  1 tablet by mouth daily as needed.   History of alcohol use-she consumes 2 glasses of wine or 1 glass of water about 5 times per week.  Primary osteoarthritis of both hands-she has DIP and PIP thickening with no synovitis.  Primary osteoarthritis of both feet-proper fitting shoes were discussed.  Trochanteric bursitis of both hips-IT band stretches were discussed.  Chronic midline low back pain without sciatica-she states she has had lower back pain for many years.  She has been having increased lower back pain recently.  She had no point tenderness.  The discomfort was mostly in the lower lumbar paraspinal region.  I have given her handout on back exercises.  If her symptoms persist then she should notify us.  Hx of colonic  polyps  Essential hypertension-blood pressure was elevated today.  Increased risk of heart disease with gout was discussed.  Handout was placed in the AVS regarding dietary modifications and exercise.  History of hyperlipidemia  Prediabetes  History of basal cell carcinoma  Orders: Orders Placed This Encounter  Procedures   CBC with Differential/Platelet   COMPLETE METABOLIC PANEL WITH GFR   Uric acid    No orders of the defined types were placed in this encounter.    Follow-Up Instructions: Return in about 6 months (around 09/06/2021) for Osteoarthritis, Gout.   Bo Merino, MD  Note - This record has been created using Editor, commissioning.  Chart creation errors have been sought, but may not always  have been located. Such creation errors do not reflect on  the standard of medical care.

## 2021-03-06 ENCOUNTER — Encounter: Payer: Self-pay | Admitting: Rheumatology

## 2021-03-06 ENCOUNTER — Ambulatory Visit (INDEPENDENT_AMBULATORY_CARE_PROVIDER_SITE_OTHER): Payer: Medicare Other | Admitting: Rheumatology

## 2021-03-06 ENCOUNTER — Other Ambulatory Visit: Payer: Self-pay

## 2021-03-06 ENCOUNTER — Telehealth: Payer: Self-pay | Admitting: Family Medicine

## 2021-03-06 VITALS — BP 149/81 | HR 58 | Ht 60.0 in | Wt 141.8 lb

## 2021-03-06 DIAGNOSIS — E790 Hyperuricemia without signs of inflammatory arthritis and tophaceous disease: Secondary | ICD-10-CM

## 2021-03-06 DIAGNOSIS — M19041 Primary osteoarthritis, right hand: Secondary | ICD-10-CM

## 2021-03-06 DIAGNOSIS — G8929 Other chronic pain: Secondary | ICD-10-CM

## 2021-03-06 DIAGNOSIS — R7303 Prediabetes: Secondary | ICD-10-CM | POA: Diagnosis not present

## 2021-03-06 DIAGNOSIS — M19071 Primary osteoarthritis, right ankle and foot: Secondary | ICD-10-CM | POA: Diagnosis not present

## 2021-03-06 DIAGNOSIS — Z8639 Personal history of other endocrine, nutritional and metabolic disease: Secondary | ICD-10-CM

## 2021-03-06 DIAGNOSIS — Z87898 Personal history of other specified conditions: Secondary | ICD-10-CM | POA: Diagnosis not present

## 2021-03-06 DIAGNOSIS — M19042 Primary osteoarthritis, left hand: Secondary | ICD-10-CM

## 2021-03-06 DIAGNOSIS — Z86010 Personal history of colonic polyps: Secondary | ICD-10-CM | POA: Diagnosis not present

## 2021-03-06 DIAGNOSIS — M7061 Trochanteric bursitis, right hip: Secondary | ICD-10-CM | POA: Diagnosis not present

## 2021-03-06 DIAGNOSIS — M1A0790 Idiopathic chronic gout, unspecified ankle and foot, without tophus (tophi): Secondary | ICD-10-CM

## 2021-03-06 DIAGNOSIS — M545 Low back pain, unspecified: Secondary | ICD-10-CM | POA: Diagnosis not present

## 2021-03-06 DIAGNOSIS — I10 Essential (primary) hypertension: Secondary | ICD-10-CM | POA: Diagnosis not present

## 2021-03-06 DIAGNOSIS — Z85828 Personal history of other malignant neoplasm of skin: Secondary | ICD-10-CM

## 2021-03-06 DIAGNOSIS — M7062 Trochanteric bursitis, left hip: Secondary | ICD-10-CM

## 2021-03-06 DIAGNOSIS — M19072 Primary osteoarthritis, left ankle and foot: Secondary | ICD-10-CM

## 2021-03-06 DIAGNOSIS — Z5181 Encounter for therapeutic drug level monitoring: Secondary | ICD-10-CM | POA: Diagnosis not present

## 2021-03-06 NOTE — Patient Instructions (Addendum)
Back Exercises The following exercises strengthen the muscles that help to support the trunk and back. They also help to keep the lower back flexible. Doing these exercises can help to prevent back pain or lessen existing pain. If you have back pain or discomfort, try doing these exercises 2-3 times each day or as told by your health care provider. As your pain improves, do them once each day, but increase the number of times that you repeat the steps for each exercise (do more repetitions). To prevent the recurrence of back pain, continue to do these exercises once each day or as told by your health care provider. Do exercises exactly as told by your health care provider and adjust them as directed. It is normal to feel mild stretching, pulling, tightness, or discomfort as you do these exercises, but you should stop right away if youfeel sudden pain or your pain gets worse. Exercises Single knee to chest Repeat these steps 3-5 times for each leg: Lie on your back on a firm bed or the floor with your legs extended. Bring one knee to your chest. Your other leg should stay extended and in contact with the floor. Hold your knee in place by grabbing your knee or thigh with both hands and hold. Pull on your knee until you feel a gentle stretch in your lower back or buttocks. Hold the stretch for 10-30 seconds. Slowly release and straighten your leg. Pelvic tilt Repeat these steps 5-10 times: Lie on your back on a firm bed or the floor with your legs extended. Bend your knees so they are pointing toward the ceiling and your feet are flat on the floor. Tighten your lower abdominal muscles to press your lower back against the floor. This motion will tilt your pelvis so your tailbone points up toward the ceiling instead of pointing to your feet or the floor. With gentle tension and even breathing, hold this position for 5-10 seconds. Cat-cow Repeat these steps until your lower back becomes more  flexible: Get into a hands-and-knees position on a firm surface. Keep your hands under your shoulders, and keep your knees under your hips. You may place padding under your knees for comfort. Let your head hang down toward your chest. Contract your abdominal muscles and point your tailbone toward the floor so your lower back becomes rounded like the back of a cat. Hold this position for 5 seconds. Slowly lift your head, let your abdominal muscles relax and point your tailbone up toward the ceiling so your back forms a sagging arch like the back of a cow. Hold this position for 5 seconds.  Press-ups Repeat these steps 5-10 times: Lie on your abdomen (face-down) on the floor. Place your palms near your head, about shoulder-width apart. Keeping your back as relaxed as possible and keeping your hips on the floor, slowly straighten your arms to raise the top half of your body and lift your shoulders. Do not use your back muscles to raise your upper torso. You may adjust the placement of your hands to make yourself more comfortable. Hold this position for 5 seconds while you keep your back relaxed. Slowly return to lying flat on the floor.  Bridges Repeat these steps 10 times: Lie on your back on a firm surface. Bend your knees so they are pointing toward the ceiling and your feet are flat on the floor. Your arms should be flat at your sides, next to your body. Tighten your buttocks muscles and lift your   buttocks off the floor until your waist is at almost the same height as your knees. You should feel the muscles working in your buttocks and the back of your thighs. If you do not feel these muscles, slide your feet 1-2 inches farther away from your buttocks. Hold this position for 3-5 seconds. Slowly lower your hips to the starting position, and allow your buttocks muscles to relax completely. If this exercise is too easy, try doing it with your arms crossed over yourchest. Abdominal  crunches Repeat these steps 5-10 times: Lie on your back on a firm bed or the floor with your legs extended. Bend your knees so they are pointing toward the ceiling and your feet are flat on the floor. Cross your arms over your chest. Tip your chin slightly toward your chest without bending your neck. Tighten your abdominal muscles and slowly raise your trunk (torso) high enough to lift your shoulder blades a tiny bit off the floor. Avoid raising your torso higher than that because it can put too much stress on your low back and does not help to strengthen your abdominal muscles. Slowly return to your starting position. Back lifts Repeat these steps 5-10 times: Lie on your abdomen (face-down) with your arms at your sides, and rest your forehead on the floor. Tighten the muscles in your legs and your buttocks. Slowly lift your chest off the floor while you keep your hips pressed to the floor. Keep the back of your head in line with the curve in your back. Your eyes should be looking at the floor. Hold this position for 3-5 seconds. Slowly return to your starting position. Contact a health care provider if: Your back pain or discomfort gets much worse when you do an exercise. Your worsening back pain or discomfort does not lessen within 2 hours after you exercise. If you have any of these problems, stop doing these exercises right away. Do not do them again unless your health care provider says that you can. Get help right away if: You develop sudden, severe back pain. If this happens, stop doing the exercises right away. Do not do them again unless your health care provider says that you can. This information is not intended to replace advice given to you by your health care provider. Make sure you discuss any questions you have with your healthcare provider. Document Revised: 12/17/2018 Document Reviewed: 05/14/2018 Elsevier Patient Education  [place].  Heart Disease Prevention    Your inflammatory disease increases your risk of heart disease which includes heart attack, stroke, atrial fibrillation (irregular heartbeats), high blood pressure, heart failure and atherosclerosis (plaque in the arteries).  It is important to reduce your risk by:   Keep blood pressure, cholesterol, and blood sugar at healthy levels   Smoking Cessation   Maintain a healthy weight  BMI 20-25   Eat a healthy diet  Plenty of fresh fruit, vegetables, and whole grains  Limit saturated fats, foods high in sodium, and added sugars  DASH and Mediterranean diet   Increase physical activity  Recommend moderate physically activity for 150 minutes per week/ 30 minutes a day for five days a week These can be broken up into three separate ten-minute sessions during the day.   Reduce Stress  Meditation, slow breathing exercises, yoga, coloring books  Dental visits twice a year

## 2021-03-06 NOTE — Telephone Encounter (Signed)
LVM for pt to rtn my call to schedule AWV with [facility] on 04/02/21

## 2021-03-07 LAB — COMPLETE METABOLIC PANEL WITH GFR
AG Ratio: 1.6 (calc) (ref 1.0–2.5)
ALT: 21 U/L (ref 6–29)
AST: 23 U/L (ref 10–35)
Albumin: 4.5 g/dL (ref 3.6–5.1)
Alkaline phosphatase (APISO): 85 U/L (ref 37–153)
BUN: 19 mg/dL (ref 7–25)
CO2: 23 mmol/L (ref 20–32)
Calcium: 9.8 mg/dL (ref 8.6–10.4)
Chloride: 101 mmol/L (ref 98–110)
Creat: 0.94 mg/dL (ref 0.60–1.00)
Globulin: 2.8 g/dL (calc) (ref 1.9–3.7)
Glucose, Bld: 99 mg/dL (ref 65–99)
Potassium: 5 mmol/L (ref 3.5–5.3)
Sodium: 135 mmol/L (ref 135–146)
Total Bilirubin: 0.4 mg/dL (ref 0.2–1.2)
Total Protein: 7.3 g/dL (ref 6.1–8.1)
eGFR: 63 mL/min/{1.73_m2} (ref >=60)

## 2021-03-07 LAB — CBC WITH DIFFERENTIAL/PLATELET
Absolute Monocytes: 810 cells/uL (ref 200–950)
Basophils Absolute: 81 cells/uL (ref 0–200)
Basophils Relative: 1 %
Eosinophils Absolute: 73 cells/uL (ref 15–500)
Eosinophils Relative: 0.9 %
HCT: 38.3 % (ref 35.0–45.0)
Hemoglobin: 12.8 g/dL (ref 11.7–15.5)
Lymphs Abs: 2600 cells/uL (ref 850–3900)
MCH: 32.5 pg (ref 27.0–33.0)
MCHC: 33.4 g/dL (ref 32.0–36.0)
MCV: 97.2 fL (ref 80.0–100.0)
MPV: 10.2 fL (ref 7.5–12.5)
Monocytes Relative: 10 %
Neutro Abs: 4536 cells/uL (ref 1500–7800)
Neutrophils Relative %: 56 %
Platelets: 290 10*3/uL (ref 140–400)
RBC: 3.94 10*6/uL (ref 3.80–5.10)
RDW: 13.1 % (ref 11.0–15.0)
Total Lymphocyte: 32.1 %
WBC: 8.1 10*3/uL (ref 3.8–10.8)

## 2021-03-07 LAB — URIC ACID: Uric Acid, Serum: 3.8 mg/dL (ref 2.5–7.0)

## 2021-03-13 DIAGNOSIS — L905 Scar conditions and fibrosis of skin: Secondary | ICD-10-CM | POA: Diagnosis not present

## 2021-03-13 DIAGNOSIS — D225 Melanocytic nevi of trunk: Secondary | ICD-10-CM | POA: Diagnosis not present

## 2021-03-13 DIAGNOSIS — L821 Other seborrheic keratosis: Secondary | ICD-10-CM | POA: Diagnosis not present

## 2021-03-13 DIAGNOSIS — Z872 Personal history of diseases of the skin and subcutaneous tissue: Secondary | ICD-10-CM | POA: Diagnosis not present

## 2021-03-13 DIAGNOSIS — L570 Actinic keratosis: Secondary | ICD-10-CM | POA: Diagnosis not present

## 2021-03-13 DIAGNOSIS — L814 Other melanin hyperpigmentation: Secondary | ICD-10-CM | POA: Diagnosis not present

## 2021-03-15 ENCOUNTER — Other Ambulatory Visit: Payer: Self-pay | Admitting: Physician Assistant

## 2021-03-16 NOTE — Telephone Encounter (Signed)
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 CMP WNL.  Uric acid within the desirable range.  Per protocol, okay to refill per Dr. Estanislado Pandy

## 2021-04-02 ENCOUNTER — Ambulatory Visit: Payer: Medicare Other

## 2021-04-04 ENCOUNTER — Ambulatory Visit (INDEPENDENT_AMBULATORY_CARE_PROVIDER_SITE_OTHER): Payer: Medicare Other | Admitting: Family Medicine

## 2021-04-04 ENCOUNTER — Encounter: Payer: Self-pay | Admitting: Family Medicine

## 2021-04-04 ENCOUNTER — Other Ambulatory Visit: Payer: Self-pay

## 2021-04-04 VITALS — BP 156/82 | HR 65 | Temp 97.9°F | Ht 59.5 in | Wt 140.6 lb

## 2021-04-04 DIAGNOSIS — M8580 Other specified disorders of bone density and structure, unspecified site: Secondary | ICD-10-CM | POA: Diagnosis not present

## 2021-04-04 DIAGNOSIS — M10079 Idiopathic gout, unspecified ankle and foot: Secondary | ICD-10-CM

## 2021-04-04 DIAGNOSIS — E7800 Pure hypercholesterolemia, unspecified: Secondary | ICD-10-CM

## 2021-04-04 DIAGNOSIS — I10 Essential (primary) hypertension: Secondary | ICD-10-CM | POA: Diagnosis not present

## 2021-04-04 DIAGNOSIS — Z1211 Encounter for screening for malignant neoplasm of colon: Secondary | ICD-10-CM | POA: Diagnosis not present

## 2021-04-04 DIAGNOSIS — R7303 Prediabetes: Secondary | ICD-10-CM | POA: Diagnosis not present

## 2021-04-04 LAB — HEMOGLOBIN A1C: Hgb A1c MFr Bld: 6.4 % (ref 4.6–6.5)

## 2021-04-04 LAB — TSH: TSH: 3.74 u[IU]/mL (ref 0.35–5.50)

## 2021-04-04 LAB — LIPID PANEL
Cholesterol: 177 mg/dL (ref 0–200)
HDL: 50.6 mg/dL (ref >39.00)
NonHDL: 126.13
Total CHOL/HDL Ratio: 3
Triglycerides: 264 mg/dL — ABNORMAL HIGH (ref 0.0–149.0)
VLDL: 52.8 mg/dL — ABNORMAL HIGH (ref 0.0–40.0)

## 2021-04-04 LAB — LDL CHOLESTEROL, DIRECT: Direct LDL: 93 mg/dL

## 2021-04-04 MED ORDER — ATENOLOL 25 MG PO TABS: ORAL_TABLET | ORAL | 3 refills | Status: DC

## 2021-04-04 MED ORDER — ATORVASTATIN CALCIUM 20 MG PO TABS: ORAL_TABLET | ORAL | 3 refills | Status: DC

## 2021-04-04 MED ORDER — LISINOPRIL 20 MG PO TABS: 30.0000 mg | ORAL_TABLET | Freq: Every day | ORAL | 3 refills | Status: DC

## 2021-04-04 NOTE — Assessment & Plan Note (Signed)
A1C ordered  Pt notes diet is fairly good but could use more exercise  disc imp of low glycemic diet and wt loss to prevent DM2

## 2021-04-04 NOTE — Patient Instructions (Addendum)
If you are interested in the new shingles vaccine (Shingrix) - call your local pharmacy to check on coverage and availability  If affordable, get on a wait list at your pharmacy to get the vaccine.   Think about indoor exercise  Videos are popular (chair yoga)  Walk outside when weather permits  This is important for bone health and overall health   Labs today   Go up on the lisinopril to 30 mg (1 1/2 pills) daily  If any problems or side effects let me know  Follow up in 1-2 months

## 2021-04-04 NOTE — Assessment & Plan Note (Signed)
  BP Readings from Last 1 Encounters:  04/04/21 (!) 156/82   Elevated the past 3 cone visits  Plan to increase lisinopril from 20 to 30 mg and then f/u  Rev s/s of hypotension to watch for  Most recent labs reviewed  Disc lifstyle change with low sodium diet and exercise

## 2021-04-04 NOTE — Assessment & Plan Note (Signed)
Pt declines another dexa at this time  No falls or fractures  Takes vit D Disc ways to add indoor exercise when it is too hot to walk

## 2021-04-04 NOTE — Progress Notes (Signed)
Subjective:    Patient ID: Cindy [name], female    DOB: [birth year], 75 y.o.   MRN: [id number]  This visit occurred during the SARS-CoV-2 public health emergency.  Safety protocols were in place, including screening questions prior to the visit, additional usage of staff PPE, and extensive cleaning of exam room while observing appropriate contact time as indicated for disinfecting solutions.   HPI Pt presents for annual f/u of chronic health problems   Wt Readings from Last 3 Encounters:  04/04/21 140 lb 9.6 oz (63.8 kg)  03/06/21 141 lb 12.8 oz (64.3 kg)  09/05/20 138 lb (62.6 kg)   27.92 kg/m  Feeling good  Traveled a bit this year - thankful   Zoster status -may be interested in shingrix later /had zostavax  Covid immunized Flu shot-fall  Colonoscopy 11/18 with 5 y recall   Mammogram 4/22 nl  Self breast exam -no lumps or problems   Dexa- declines in past/still declines  H/o osteopenia  Falls-none  Fractures-none  Supplements -vitamin D Exercise- not a lot -heat  Needs to find indoor option Also back bothers her-seeing ortho     HTN bp is stable today  No cp or palpitations or headaches or edema  No side effects to medicines  BP Readings from Last 3 Encounters:  04/04/21 (!) 156/82  03/06/21 (!) 149/81  09/05/20 (!) 166/75     Atenolol 25 mg daily Lisinopril 20 mg daily  Pulse Readings from Last 3 Encounters:  04/04/21 65  03/06/21 (!) 58  09/05/20 65    Hyperlipidemia Lab Results  Component Value Date   CHOL 171 03/31/2020   HDL 46.60 03/31/2020   LDLCALC 76 02/07/2014   LDLDIRECT 88.0 03/31/2020   TRIG 245.0 (H) 03/31/2020   CHOLHDL 4 03/31/2020   Due for labs  Takes atorvastatin 10 mg daily   Prediabetes Lab Results  Component Value Date   HGBA1C 6.3 03/31/2020   Due for labs  Diet is fairly good  Gout  Takes allopurinol  Sees Dr Garen Grams who follows this and uric acid   Her labs Lab Results  Component Value Date    CREATININE 0.94 03/06/2021   BUN 19 03/06/2021   NA 135 03/06/2021   K 5.0 03/06/2021   CL 101 03/06/2021   CO2 23 03/06/2021    Lab Results  Component Value Date   ALT 21 03/06/2021   AST 23 03/06/2021   ALKPHOS 70 03/29/2019   BILITOT 0.4 03/06/2021   Lab Results  Component Value Date   WBC 8.1 03/06/2021   HGB 12.8 03/06/2021   HCT 38.3 03/06/2021   MCV 97.2 03/06/2021   PLT 290 03/06/2021   Lab Results  Component Value Date   LABURIC 3.8 03/06/2021   Diet is good  No fried foods   Patient Active Problem List   Diagnosis Date Noted   Gout 02/02/2019   Colon cancer screening 03/11/2017   Routine general medical examination at a health care facility 02/28/2015   Encounter for Medicare annual wellness exam 02/03/2013   Osteopenia 02/03/2012   Prediabetes 10/10/2010   COLONIC POLYPS 03/05/2010   Hyperlipidemia 07/27/2009   Essential hypertension 07/27/2009   BASAL CELL CARCINOMA, HX OF 07/27/2009   Past Medical History:  Diagnosis Date   Benign neoplasm of colon    Gout    MVP (mitral valve prolapse)    very mild   Other abnormal glucose    Other and unspecified hyperlipidemia    Personal history  of other malignant neoplasm of skin    Unspecified essential hypertension    Past Surgical History:  Procedure Laterality Date   CARDIAC CATHETERIZATION  1995   normal (after abnormal stress test)   COLONOSCOPY  07/01/03   polyps   MOHS SURGERY  11/09   for basal cell lesion   Social History   Tobacco Use   Smoking status: Never   Smokeless tobacco: Never  Vaping Use   Vaping Use: Never used  Substance Use Topics   Alcohol use: Yes    Alcohol/week: 0.0 standard drinks    Comment: occasional   Drug use: No   Family History  Problem Relation Age of Onset   Macular degeneration Mother    Sudden death Father 76   Heart disease Brother    Gout Brother    Arthritis Brother    Proofreader cancer Neg Hx    Esophageal cancer Neg Hx     Rectal cancer Neg Hx    Stomach cancer Neg Hx    Allergies  Allergen Reactions   Ampicillin     REACTION: rash   Calcium     REACTION: GI intolerance   Cephalexin     REACTION: rash   Colchicine     Diarrhea   Current Outpatient Medications on File Prior to Visit  Medication Sig Dispense Refill   allopurinol (ZYLOPRIM) 100 MG tablet TAKE 2 TABLETS(200 MG) BY MOUTH DAILY 180 tablet 0   Cholecalciferol (VITAMIN D-3) 1000 units CAPS Take 1 capsule by mouth daily.     colchicine 0.6 MG tablet Take 1 tablet (0.6 mg total) by mouth daily. (Patient taking differently: Take 0.6 mg by mouth as needed.) 90 tablet 0   Loperamide HCl (IMODIUM A-D PO) Take by mouth as needed.     triamcinolone cream (KENALOG) 0.1 % Apply 1 application topically 2 (two) times daily as needed.  2   No current facility-administered medications on file prior to visit.     Review of Systems  Constitutional:  Negative for activity change, appetite change, fatigue, fever and unexpected weight change.  HENT:  Negative for congestion, ear pain, rhinorrhea, sinus pressure and sore throat.   Eyes:  Negative for pain, redness and visual disturbance.  Respiratory:  Negative for cough, shortness of breath and wheezing.   Cardiovascular:  Negative for chest pain and palpitations.  Gastrointestinal:  Negative for abdominal pain, blood in stool, constipation and diarrhea.  Endocrine: Negative for polydipsia and polyuria.  Genitourinary:  Negative for dysuria, frequency and urgency.  Musculoskeletal:  Positive for back pain. Negative for arthralgias and myalgias.  Skin:  Negative for pallor and rash.  Allergic/Immunologic: Negative for environmental allergies.  Neurological:  Negative for dizziness, syncope and headaches.  Hematological:  Negative for adenopathy. Does not bruise/bleed easily.  Psychiatric/Behavioral:  Negative for decreased concentration and dysphoric mood. The patient is not nervous/anxious.        Objective:   Physical Exam Constitutional:      General: She is not in acute distress.    Appearance: Normal appearance. She is well-developed and normal weight. She is not ill-appearing or diaphoretic.  HENT:     Head: Normocephalic and atraumatic.     Right Ear: Tympanic membrane, ear canal and external ear normal.     Left Ear: Tympanic membrane, ear canal and external ear normal.     Nose: Nose normal. No congestion.     Mouth/Throat:     Mouth: Mucous  membranes are moist.     Pharynx: Oropharynx is clear. No posterior oropharyngeal erythema.  Eyes:     General: No scleral icterus.    Extraocular Movements: Extraocular movements intact.     Conjunctiva/sclera: Conjunctivae normal.     Pupils: Pupils are equal, round, and reactive to light.  Neck:     Thyroid: No thyromegaly.     Vascular: No carotid bruit or JVD.  Cardiovascular:     Rate and Rhythm: Normal rate and regular rhythm.     Pulses: Normal pulses.     Heart sounds: Normal heart sounds.    No gallop.  Pulmonary:     Effort: Pulmonary effort is normal. No respiratory distress.     Breath sounds: Normal breath sounds. No wheezing.     Comments: Good air exch Chest:     Chest wall: No tenderness.  Abdominal:     General: Bowel sounds are normal. There is no distension or abdominal bruit.     Palpations: Abdomen is soft. There is no mass.     Tenderness: There is no abdominal tenderness.     Hernia: No hernia is present.  Genitourinary:    Comments: Breast exam: No mass, nodules, thickening, tenderness, bulging, retraction, inflamation, nipple discharge or skin changes noted.  No axillary or clavicular LA.     Musculoskeletal:        General: No tenderness. Normal range of motion.     Cervical back: Normal range of motion and neck supple. No rigidity. No muscular tenderness.     Right lower leg: No edema.     Left lower leg: No edema.     Comments: No kyphosis   Lymphadenopathy:     Cervical: No cervical  adenopathy.  Skin:    General: Skin is warm and dry.     Coloration: Skin is not pale.     Findings: No erythema or rash.     Comments: Solar lentigines diffusely Few sks   Neurological:     Mental Status: She is alert. Mental status is at baseline.     Cranial Nerves: No cranial nerve deficit.     Motor: No abnormal muscle tone.     Coordination: Coordination normal.     Gait: Gait normal.     Deep Tendon Reflexes: Reflexes are normal and symmetric. Reflexes normal.  Psychiatric:        Mood and Affect: Mood normal.        Cognition and Memory: Cognition and memory normal.          Assessment & Plan:   Problem List Items Addressed This Visit       Cardiovascular and Mediastinum   Essential hypertension - Primary     BP Readings from Last 1 Encounters:  04/04/21 (!) 156/82  Elevated the past 3 cone visits  Plan to increase lisinopril from 20 to 30 mg and then f/u  Rev s/s of hypotension to watch for  Most recent labs reviewed  Disc lifstyle change with low sodium diet and exercise        Relevant Medications   lisinopril (ZESTRIL) 20 MG tablet   atorvastatin (LIPITOR) 20 MG tablet   atenolol (TENORMIN) 25 MG tablet   Other Relevant Orders   TSH (Completed)     Musculoskeletal and Integument   Osteopenia    Pt declines another dexa at this time  No falls or fractures  Takes vit D Disc ways to add indoor exercise when it is  too hot to walk           Other   Hyperlipidemia    Disc goals for lipids and reasons to control them Rev last labs with pt Rev low sat fat diet in detail  Labs ordered  Taking atorvastatin 10 mg daily         Relevant Medications   lisinopril (ZESTRIL) 20 MG tablet   atorvastatin (LIPITOR) 20 MG tablet   atenolol (TENORMIN) 25 MG tablet   Other Relevant Orders   Lipid panel (Completed)   Prediabetes    A1C ordered  Pt notes diet is fairly good but could use more exercise  disc imp of low glycemic diet and wt loss to  prevent DM2        Relevant Orders   Hemoglobin A1c (Completed)   Colon cancer screening    Colonoscopy 11/18 with 5 y recall        Gout    Sees rheumatology for this (Dr Garen Grams)  Recent labs reviewed and stable  Taking allopurinol and doing well

## 2021-04-04 NOTE — Assessment & Plan Note (Signed)
Disc goals for lipids and reasons to control them Rev last labs with pt Rev low sat fat diet in detail  Labs ordered  Taking atorvastatin 10 mg daily   

## 2021-04-04 NOTE — Assessment & Plan Note (Signed)
Sees rheumatology for this (Dr Garen Grams)  Recent labs reviewed and stable  Taking allopurinol and doing well

## 2021-04-04 NOTE — Assessment & Plan Note (Signed)
Colonoscopy 11/18 with 5 y recall

## 2021-04-11 ENCOUNTER — Ambulatory Visit: Payer: Medicare Other

## 2021-05-23 ENCOUNTER — Ambulatory Visit: Payer: Medicare Other

## 2021-05-25 ENCOUNTER — Other Ambulatory Visit: Payer: Self-pay

## 2021-05-25 ENCOUNTER — Ambulatory Visit: Payer: Medicare Other | Attending: Internal Medicine

## 2021-05-25 DIAGNOSIS — Z23 Encounter for immunization: Secondary | ICD-10-CM

## 2021-05-25 MED ORDER — PFIZER COVID-19 VAC BIVALENT 30 MCG/0.3ML IM SUSP
INTRAMUSCULAR | 0 refills | Status: DC
  Filled 2021-05-25: qty 0.3, 1d supply, fill #0

## 2021-05-25 NOTE — Progress Notes (Signed)
   Covid-19 Vaccination Clinic  Name:  Cindy [name]    MRN: [id number] DOB: 04-12-1946  05/25/2021  [name] was observed post Covid-19 immunization for 15 minutes without incident. She was provided with Vaccine Information Sheet and instruction to access the V-Safe system.   [name] was instructed to call 911 with any severe reactions post vaccine: Difficulty breathing  Swelling of face and throat  A fast heartbeat  A bad rash all over body  Dizziness and weakness   Lu Duffel, PharmD, MBA Clinical Acute Care Pharmacist

## 2021-06-04 ENCOUNTER — Ambulatory Visit (INDEPENDENT_AMBULATORY_CARE_PROVIDER_SITE_OTHER): Payer: Medicare Other | Admitting: Family Medicine

## 2021-06-04 ENCOUNTER — Other Ambulatory Visit: Payer: Self-pay

## 2021-06-04 ENCOUNTER — Encounter: Payer: Self-pay | Admitting: Family Medicine

## 2021-06-04 VITALS — BP 158/80 | HR 68 | Temp 97.8°F | Ht 59.5 in | Wt 143.0 lb

## 2021-06-04 DIAGNOSIS — I10 Essential (primary) hypertension: Secondary | ICD-10-CM

## 2021-06-04 DIAGNOSIS — Z23 Encounter for immunization: Secondary | ICD-10-CM | POA: Diagnosis not present

## 2021-06-04 MED ORDER — LOSARTAN POTASSIUM 100 MG PO TABS: 100.0000 mg | ORAL_TABLET | Freq: Every day | ORAL | 1 refills | Status: DC

## 2021-06-04 NOTE — Assessment & Plan Note (Signed)
BP: (!) 158/80    No symptoms  Not improved with inc lisinopril Will change to losartan 100 mg daily -will start in a week after a trip Continue tenormin 25 mg daily  inst to alert if side eff or problems Plans to get omron bp cuff and start home monitoring (handout given)  It may be lower at home F/u 1-2 mo  Will update with readings in between Plans to watch diet more carefully (DASH eating handout given)

## 2021-06-04 NOTE — Patient Instructions (Addendum)
If you want to monitor at home  OMRON for arm- regular size  Bring it to your next visit and we will see if it is accurate   Stop lisinopril  Start losartan 100 mg daily -start after your trip   If any problems or side effects let me know   Take care of yourself   Once you are on the losartan for 7 days, let us know how your blood pressure is  If not better we may have to add something

## 2021-06-04 NOTE — Progress Notes (Signed)
Subjective:    Patient ID: Cindy [name], female    DOB: 1946/03/10, 75 y.o.   MRN: [id number]  This visit occurred during the SARS-CoV-2 public health emergency.  Safety protocols were in place, including screening questions prior to the visit, additional usage of staff PPE, and extensive cleaning of exam room while observing appropriate contact time as indicated for disinfecting solutions.   HPI Pt presents for f/u of HTN  Wt Readings from Last 3 Encounters:  06/04/21 143 lb (64.9 kg)  04/04/21 140 lb 9.6 oz (63.8 kg)  03/06/21 141 lb 12.8 oz (64.3 kg)   28.40 kg/m  We increased her lisinopril from 20 to 30 mg daily at last visit on august   Doing fine  ? What is going on with her bp  Does not think her cuff is accurate   BP Readings from Last 3 Encounters:  06/04/21 (!) 158/80  04/04/21 (!) 156/82  03/06/21 (!) 149/81     Pulse Readings from Last 3 Encounters:  06/04/21 68  04/04/21 65  03/06/21 (!) 58   Tenormin 25 mg daily  Lisinopril 30 mg daily  No change in diet  Does not go out to eat very often  Wants to loose a little weight   Loves salty stuff  Dislikes sweets overall   No headaches or chest pain or pedal edema    Lab Results  Component Value Date   CREATININE 0.94 03/06/2021   BUN 19 03/06/2021   NA 135 03/06/2021   K 5.0 03/06/2021   CL 101 03/06/2021   CO2 23 03/06/2021    Patient Active Problem List   Diagnosis Date Noted   Gout 02/02/2019   Colon cancer screening 03/11/2017   Routine general medical examination at a health care facility 02/28/2015   Encounter for Medicare annual wellness exam 02/03/2013   Osteopenia 02/03/2012   Prediabetes 10/10/2010   COLONIC POLYPS 03/05/2010   Hyperlipidemia 07/27/2009   Essential hypertension 07/27/2009   BASAL CELL CARCINOMA, HX OF 07/27/2009   Past Medical History:  Diagnosis Date   Benign neoplasm of colon    Gout    MVP (mitral valve prolapse)    very mild   Other abnormal  glucose    Other and unspecified hyperlipidemia    Personal history of other malignant neoplasm of skin    Unspecified essential hypertension    Past Surgical History:  Procedure Laterality Date   CARDIAC CATHETERIZATION  1995   normal (after abnormal stress test)   COLONOSCOPY  07/01/03   polyps   MOHS SURGERY  11/09   for basal cell lesion   Social History   Tobacco Use   Smoking status: Never   Smokeless tobacco: Never  Vaping Use   Vaping Use: Never used  Substance Use Topics   Alcohol use: Yes    Alcohol/week: 0.0 standard drinks    Comment: occasional   Drug use: No   Family History  Problem Relation Age of Onset   Macular degeneration Mother    Sudden death Father 70   Heart disease Brother    Gout Brother    Arthritis Brother    Healthy Daughter    Colon cancer Neg Hx    Esophageal cancer Neg Hx    Rectal cancer Neg Hx    Stomach cancer Neg Hx    Allergies  Allergen Reactions   Ampicillin     REACTION: rash   Calcium     REACTION: GI intolerance  Cephalexin     REACTION: rash   Colchicine     Diarrhea   Current Outpatient Medications on File Prior to Visit  Medication Sig Dispense Refill   allopurinol (ZYLOPRIM) 100 MG tablet TAKE 2 TABLETS(200 MG) BY MOUTH DAILY 180 tablet 0   atenolol (TENORMIN) 25 MG tablet TAKE 1 TABLET(25 MG) BY MOUTH DAILY 90 tablet 3   atorvastatin (LIPITOR) 20 MG tablet TAKE 1 TABLET(20 MG) BY MOUTH DAILY 90 tablet 3   Cholecalciferol (VITAMIN D-3) 1000 units CAPS Take 1 capsule by mouth daily.     colchicine 0.6 MG tablet Take 1 tablet (0.6 mg total) by mouth daily. (Patient taking differently: Take 0.6 mg by mouth as needed.) 90 tablet 0   COVID-19 mRNA bivalent vaccine, Pfizer, (PFIZER COVID-19 VAC BIVALENT) injection Inject into the muscle. 0.3 mL 0   Loperamide HCl (IMODIUM A-D PO) Take by mouth as needed.     triamcinolone cream (KENALOG) 0.1 % Apply 1 application topically 2 (two) times daily as needed.  2   No  current facility-administered medications on file prior to visit.    Review of Systems  Constitutional:  Negative for activity change, appetite change, fatigue, fever and unexpected weight change.  HENT:  Negative for congestion, ear pain, rhinorrhea, sinus pressure and sore throat.   Eyes:  Negative for pain, redness and visual disturbance.  Respiratory:  Negative for cough, shortness of breath and wheezing.   Cardiovascular:  Negative for chest pain and palpitations.  Gastrointestinal:  Negative for abdominal pain, blood in stool, constipation and diarrhea.  Endocrine: Negative for polydipsia and polyuria.  Genitourinary:  Negative for dysuria, frequency and urgency.  Musculoskeletal:  Negative for arthralgias, back pain and myalgias.  Skin:  Negative for pallor and rash.  Allergic/Immunologic: Negative for environmental allergies.  Neurological:  Negative for dizziness, syncope and headaches.  Hematological:  Negative for adenopathy. Does not bruise/bleed easily.  Psychiatric/Behavioral:  Negative for decreased concentration and dysphoric mood. The patient is not nervous/anxious.       Objective:   Physical Exam Constitutional:      General: She is not in acute distress.    Appearance: Normal appearance. She is well-developed and normal weight. She is not ill-appearing.  HENT:     Head: Normocephalic and atraumatic.  Eyes:     Conjunctiva/sclera: Conjunctivae normal.     Pupils: Pupils are equal, round, and reactive to light.  Neck:     Thyroid: No thyromegaly.     Vascular: No carotid bruit or JVD.  Cardiovascular:     Rate and Rhythm: Normal rate and regular rhythm.     Heart sounds: Normal heart sounds.    No gallop.  Pulmonary:     Effort: Pulmonary effort is normal. No respiratory distress.     Breath sounds: Normal breath sounds. No wheezing or rales.  Abdominal:     General: There is no abdominal bruit.  Musculoskeletal:     Cervical back: Normal range of motion  and neck supple.     Right lower leg: No edema.     Left lower leg: No edema.  Lymphadenopathy:     Cervical: No cervical adenopathy.  Skin:    General: Skin is warm and dry.     Coloration: Skin is not pale.     Findings: No rash.  Neurological:     Mental Status: She is alert.     Coordination: Coordination normal.     Deep Tendon Reflexes: Reflexes are normal  and symmetric. Reflexes normal.  Psychiatric:        Mood and Affect: Mood normal.          Assessment & Plan:   Problem List Items Addressed This Visit       Cardiovascular and Mediastinum   Essential hypertension - Primary    BP: (!) 158/80    No symptoms  Not improved with inc lisinopril Will change to losartan 100 mg daily -will start in a week after a trip Continue tenormin 25 mg daily  inst to alert if side eff or problems Plans to get omron bp cuff and start home monitoring (handout given)  It may be lower at home F/u 1-2 mo  Will update with readings in between Plans to watch diet more carefully (DASH eating handout given)       Relevant Medications   losartan (COZAAR) 100 MG tablet

## 2021-06-17 ENCOUNTER — Other Ambulatory Visit: Payer: Self-pay | Admitting: Rheumatology

## 2021-06-18 NOTE — Telephone Encounter (Signed)
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 CMP WNL.  Uric acid within the desirable range.   Per protocol, okay to refill per Dr. Estanislado Pandy

## 2021-06-24 ENCOUNTER — Encounter: Payer: Self-pay | Admitting: Family Medicine

## 2021-07-04 ENCOUNTER — Other Ambulatory Visit: Payer: Self-pay | Admitting: Physician Assistant

## 2021-07-04 NOTE — Telephone Encounter (Signed)
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 CMP WNL.  Uric acid within the desirable range.  Okay to refill Colchicine?

## 2021-08-21 NOTE — Progress Notes (Signed)
Office Visit Note  Patient: Cindy [name]             Date of Birth: [birth year]           MRN: [id number]             PCP: Abner Greenspan, MD Referring: Tower, Wynelle Fanny, MD Visit Date: 09/04/2021 Occupation: @GUAROCC @  Subjective:  Medication monitoring   History of Present Illness: Jennae [name] is a 75 y.o. female with history of gout and osteoarthritis.  Patient is taking allopurinol 100 mg 2 tablets daily and colchicine 0.6 mg 1 tablet as needed during flares.  She is tolerating allopurinol without any side effects .  She denies any signs or symptoms of a gout flare.  She has not needed to take colchicine since prior to her last office visit.  She states that her last gout flare was about 2 years ago.  She states that she has occasional discomfort in her lower back as well as on the lateral aspect of both hips.  She has not been experiencing any symptoms of radiculopathy.  She denies any joint swelling.  She denies any new medical conditions since her last follow-up visit.    Activities of Daily Living:  Patient reports morning stiffness for 10-15 minutes.   Patient Reports nocturnal pain.  Difficulty dressing/grooming: Denies Difficulty climbing stairs: Reports Difficulty getting out of chair: Denies Difficulty using hands for taps, buttons, cutlery, and/or writing: Denies  Review of Systems  Constitutional:  Negative for fatigue.  HENT:  Negative for mouth sores, mouth dryness and nose dryness.   Eyes:  Negative for pain, itching and dryness.  Respiratory:  Negative for shortness of breath and difficulty breathing.   Cardiovascular:  Negative for chest pain and palpitations.  Gastrointestinal:  Negative for blood in stool, constipation and diarrhea.  Endocrine: Negative for increased urination.  Genitourinary:  Negative for difficulty urinating.  Musculoskeletal:  Positive for morning stiffness. Negative for joint pain, joint pain, joint swelling, myalgias, muscle  tenderness and myalgias.  Skin:  Negative for color change, rash and redness.  Allergic/Immunologic: Negative for susceptible to infections.  Neurological:  Negative for dizziness, numbness, headaches, memory loss and weakness.  Hematological:  Negative for bruising/bleeding tendency.  Psychiatric/Behavioral:  Negative for confusion.    PMFS History:  Patient Active Problem List   Diagnosis Date Noted   Gout 02/02/2019   Colon cancer screening 03/11/2017   Routine general medical examination at a health care facility 02/28/2015   Encounter for Medicare annual wellness exam 02/03/2013   Osteopenia 02/03/2012   Prediabetes 10/10/2010   COLONIC POLYPS 03/05/2010   Hyperlipidemia 07/27/2009   Essential hypertension 07/27/2009   BASAL CELL CARCINOMA, HX OF 07/27/2009    Past Medical History:  Diagnosis Date   Benign neoplasm of colon    Gout    MVP (mitral valve prolapse)    very mild   Other abnormal glucose    Other and unspecified hyperlipidemia    Personal history of other malignant neoplasm of skin    Unspecified essential hypertension     Family History  Problem Relation Age of Onset   Macular degeneration Mother    Sudden death Father 16   Heart disease Brother    Gout Brother    Arthritis Brother    Healthy Daughter    Colon cancer Neg Hx    Esophageal cancer Neg Hx    Rectal cancer Neg Hx    Stomach cancer Neg Hx  Past Surgical History:  Procedure Laterality Date   CARDIAC CATHETERIZATION  1995   normal (after abnormal stress test)   COLONOSCOPY  07/01/03   polyps   MOHS SURGERY  11/09   for basal cell lesion   Social History   Social History Narrative   Retired-med tech      Married-husband has advanced parkinson's      No regular exercise   Immunization History  Administered Date(s) Administered   Fluad Quad(high Dose 65+) 05/25/2019   Hepatitis B 07/03/1988, 07/30/1988, 01/01/1989   Influenza Split 06/04/2011   Influenza Whole 04/26/2009,  05/26/2009, 05/28/2010, 06/02/2012   Influenza, High Dose Seasonal PF 06/17/2018   Influenza,inj,Quad PF,6+ Mos 06/18/2017   Influenza-Unspecified 06/01/2013, 05/27/2014, 05/26/2018   PFIZER Comirnaty(Gray Top)Covid-19 Tri-Sucrose Vaccine 12/01/2020   PFIZER(Purple Top)SARS-COV-2 Vaccination 09/05/2019, 09/26/2019, 05/30/2020   Pfizer Covid-19 Vaccine Bivalent Booster 39yrs & up 05/25/2021   Pneumococcal Conjugate-13 01/12/2015   Pneumococcal Polysaccharide-23 10/24/2010   Td 08/26/2004   Zoster, Live 05/07/2013     Objective: Vital Signs: BP (!) 167/80 (BP Location: Left Arm, Patient Position: Sitting, Cuff Size: Normal)    Pulse 70    Ht 4' 11.75" (1.518 m)    Wt 142 lb 3.2 oz (64.5 kg)    BMI 28.00 kg/m    Physical Exam Vitals and nursing note reviewed.  Constitutional:      Appearance: She is well-developed.  HENT:     Head: Normocephalic and atraumatic.  Eyes:     Conjunctiva/sclera: Conjunctivae normal.  Pulmonary:     Effort: Pulmonary effort is normal.  Abdominal:     Palpations: Abdomen is soft.  Musculoskeletal:     Cervical back: Normal range of motion.  Skin:    General: Skin is warm and dry.     Capillary Refill: Capillary refill takes less than 2 seconds.  Neurological:     Mental Status: She is alert and oriented to person, place, and time.  Psychiatric:        Behavior: Behavior normal.     Musculoskeletal Exam: C-spine, thoracic spine, and lumbar spine good range of motion. Midline spinal tenderness in the lumbar region. Shoulder joints, elbow joints, wrist joints, MCPs, PIPs, DIPs have good range of motion with no synovitis.  Complete fist formation bilaterally.  PIP and DIP thickening consistent with osteoarthritis of both hands.  Hip joints, knee joints, ankle joints have good ROM with no discomfort.  No warmth or effusion of knee joints.  No tenderness or swelling of ankle joints.  No tenderness over MTP joints.  No evidence of achilles tendonitis or  plantar fasciitis.  Tenderness over bilateral trochanteric bursa.   CDAI Exam: CDAI Score: -- Patient Global: --; Provider Global: -- Swollen: --; Tender: -- Joint Exam 09/04/2021   No joint exam has been documented for this visit   There is currently no information documented on the homunculus. Go to the Rheumatology activity and complete the homunculus joint exam.  Investigation: No additional findings.  Imaging: No results found.  Recent Labs: Lab Results  Component Value Date   WBC 8.1 03/06/2021   HGB 12.8 03/06/2021   PLT 290 03/06/2021   NA 135 03/06/2021   K 5.0 03/06/2021   CL 101 03/06/2021   CO2 23 03/06/2021   GLUCOSE 99 03/06/2021   BUN 19 03/06/2021   CREATININE 0.94 03/06/2021   BILITOT 0.4 03/06/2021   ALKPHOS 70 03/29/2019   AST 23 03/06/2021   ALT 21 03/06/2021   PROT  7.3 03/06/2021   ALBUMIN 4.4 03/29/2019   CALCIUM 9.8 03/06/2021   GFRAA 76 08/30/2020    Speciality Comments: No specialty comments available.  Procedures:  No procedures performed Allergies: Ampicillin, Calcium, Cephalexin, and Colchicine   Assessment / Plan:     Visit Diagnoses: Chronic idiopathic gout involving toe without tophus, unspecified laterality -She has not had any signs or symptoms of a gout flare.  She has clinically been doing well on allopurinol 100 mg 2 tablets by mouth daily.  She has been tolerating allopurinol without any side effects.  She has not had a gout flare in almost 2 years.  She has not needed to take colchicine since prior to her last office visit.  Her uric acid was within the desirable range: 3.8 on 03/06/2021.  CBC and CMP were within normal limits on 03/06/2021.  We will update the following lab work today.  She will remain on the current dose of allopurinol.  She does not need any refills at this time.  She was given a handout of information recommending dietary modifications.  She was advised to notify us if she develops signs or symptoms of a gout  flare.  She will back in the office in 6 months. - Plan: Uric acid  Hyperuricemia -Uric acid was within the desirable range: 3.8 on 03/06/21.  Uric acid level will be checked today.  Plan: Uric acid  Medication monitoring encounter - Allopurinol 100 mg 2 tablets by mouth daily, colchicine 0.6mg  1 tablet by mouth daily as needed.  CBC and CMP within normal limits on 03/06/2021.  Uric acid was within the desirable range: 3.8 on 03/06/2021.  She is due to update CBC, CMP, and uric acid level today. - Plan: COMPLETE METABOLIC PANEL WITH GFR, CBC with Differential/Platelet, Uric acid  Primary osteoarthritis of both hands: She has PIP and DIP thickening consistent with osteoarthritis of both hands.  No tenderness or inflammation was noted on examination.  Complete fist formation bilaterally.  Discussed the importance of joint protection and muscle strengthening.  Primary osteoarthritis of both feet: She has some PIP and DIP thickening consistent with osteoarthritis of both feet.  No tenderness or inflammation was noted on examination today.  No evidence of Achilles tendinitis or plantar fasciitis.  She has good range of motion of both ankle joints with no tenderness or joint swelling.  She is wearing proper fitting shoes.  Trochanteric bursitis of both hips: She continues to dance intermittent discomfort due to trochanteric bursitis of both hips.  On examination she has tenderness over bilateral trochanteric bursa.  Discussed the importance of performing stretching exercises on a daily basis.  She was given a handout of exercises to perform.  Chronic midline low back pain without sciatica: She experiences intermittent discomfort in her lower back.  She has some midline spinal tenderness in the lumbar region.  She has no symptoms of radiculopathy at this time.  Discussed the importance of performing back exercises.  She was given a handout of exercises to perform.  Other medical conditions are listed as follows:    History of alcohol use  Hx of colonic polyps  Essential hypertension  History of hyperlipidemia  History of basal cell carcinoma  Prediabetes  Orders: Orders Placed This Encounter  Procedures   COMPLETE METABOLIC PANEL WITH GFR   CBC with Differential/Platelet   Uric acid     Follow-Up Instructions: Return in about 6 months (around 03/04/2022) for Gout, Osteoarthritis.   Ofilia Neas, PA-C  Note - This record has been created using [employer].  Chart creation errors have been sought, but may not always  have been located. Such creation errors do not reflect on  the standard of medical care.

## 2021-09-04 ENCOUNTER — Other Ambulatory Visit: Payer: Self-pay

## 2021-09-04 ENCOUNTER — Encounter: Payer: Self-pay | Admitting: Physician Assistant

## 2021-09-04 ENCOUNTER — Ambulatory Visit (INDEPENDENT_AMBULATORY_CARE_PROVIDER_SITE_OTHER): Payer: Medicare Other | Admitting: Physician Assistant

## 2021-09-04 VITALS — BP 167/80 | HR 70 | Ht 59.75 in | Wt 142.2 lb

## 2021-09-04 DIAGNOSIS — M19042 Primary osteoarthritis, left hand: Secondary | ICD-10-CM

## 2021-09-04 DIAGNOSIS — Z8639 Personal history of other endocrine, nutritional and metabolic disease: Secondary | ICD-10-CM | POA: Diagnosis not present

## 2021-09-04 DIAGNOSIS — Z87898 Personal history of other specified conditions: Secondary | ICD-10-CM

## 2021-09-04 DIAGNOSIS — Z85828 Personal history of other malignant neoplasm of skin: Secondary | ICD-10-CM

## 2021-09-04 DIAGNOSIS — E790 Hyperuricemia without signs of inflammatory arthritis and tophaceous disease: Secondary | ICD-10-CM | POA: Diagnosis not present

## 2021-09-04 DIAGNOSIS — I10 Essential (primary) hypertension: Secondary | ICD-10-CM

## 2021-09-04 DIAGNOSIS — M19041 Primary osteoarthritis, right hand: Secondary | ICD-10-CM | POA: Diagnosis not present

## 2021-09-04 DIAGNOSIS — Z86010 Personal history of colonic polyps: Secondary | ICD-10-CM

## 2021-09-04 DIAGNOSIS — M7061 Trochanteric bursitis, right hip: Secondary | ICD-10-CM | POA: Diagnosis not present

## 2021-09-04 DIAGNOSIS — R7303 Prediabetes: Secondary | ICD-10-CM

## 2021-09-04 DIAGNOSIS — Z5181 Encounter for therapeutic drug level monitoring: Secondary | ICD-10-CM | POA: Diagnosis not present

## 2021-09-04 DIAGNOSIS — M1A0790 Idiopathic chronic gout, unspecified ankle and foot, without tophus (tophi): Secondary | ICD-10-CM | POA: Diagnosis not present

## 2021-09-04 DIAGNOSIS — M545 Low back pain, unspecified: Secondary | ICD-10-CM

## 2021-09-04 DIAGNOSIS — M19071 Primary osteoarthritis, right ankle and foot: Secondary | ICD-10-CM

## 2021-09-04 DIAGNOSIS — M7062 Trochanteric bursitis, left hip: Secondary | ICD-10-CM

## 2021-09-04 DIAGNOSIS — M19072 Primary osteoarthritis, left ankle and foot: Secondary | ICD-10-CM

## 2021-09-04 DIAGNOSIS — G8929 Other chronic pain: Secondary | ICD-10-CM

## 2021-09-04 LAB — CBC WITH DIFFERENTIAL/PLATELET
Absolute Monocytes: 865 cells/uL (ref 200–950)
Basophils Absolute: 67 cells/uL (ref 0–200)
Basophils Relative: 0.8 %
Eosinophils Absolute: 143 cells/uL (ref 15–500)
Eosinophils Relative: 1.7 %
HCT: 41.7 % (ref 35.0–45.0)
Hemoglobin: 14 g/dL (ref 11.7–15.5)
Lymphs Abs: 2243 cells/uL (ref 850–3900)
MCH: 31.9 pg (ref 27.0–33.0)
MCHC: 33.6 g/dL (ref 32.0–36.0)
MCV: 95 fL (ref 80.0–100.0)
MPV: 10.4 fL (ref 7.5–12.5)
Monocytes Relative: 10.3 %
Neutro Abs: 5082 cells/uL (ref 1500–7800)
Neutrophils Relative %: 60.5 %
Platelets: 333 10*3/uL (ref 140–400)
RBC: 4.39 10*6/uL (ref 3.80–5.10)
RDW: 13.3 % (ref 11.0–15.0)
Total Lymphocyte: 26.7 %
WBC: 8.4 10*3/uL (ref 3.8–10.8)

## 2021-09-04 LAB — COMPLETE METABOLIC PANEL WITH GFR
AG Ratio: 1.6 (calc) (ref 1.0–2.5)
ALT: 26 U/L (ref 6–29)
AST: 25 U/L (ref 10–35)
Albumin: 4.5 g/dL (ref 3.6–5.1)
Alkaline phosphatase (APISO): 79 U/L (ref 37–153)
BUN: 14 mg/dL (ref 7–25)
CO2: 31 mmol/L (ref 20–32)
Calcium: 9.6 mg/dL (ref 8.6–10.4)
Chloride: 101 mmol/L (ref 98–110)
Creat: 0.92 mg/dL (ref 0.60–1.00)
Globulin: 2.8 g/dL (calc) (ref 1.9–3.7)
Glucose, Bld: 104 mg/dL — ABNORMAL HIGH (ref 65–99)
Potassium: 4 mmol/L (ref 3.5–5.3)
Sodium: 139 mmol/L (ref 135–146)
Total Bilirubin: 0.4 mg/dL (ref 0.2–1.2)
Total Protein: 7.3 g/dL (ref 6.1–8.1)
eGFR: 65 mL/min/{1.73_m2} (ref >=60)

## 2021-09-04 LAB — URIC ACID: Uric Acid, Serum: 3.4 mg/dL (ref 2.5–7.0)

## 2021-09-04 NOTE — Patient Instructions (Addendum)
Information for patients with Gout  Gout defined-Gout occurs when urate crystals accumulate in your joint causing the inflammation and intense pain of gout attack.  Urate crystals can form when you have high levels of uric acid in your blood.  Your body produces uric acid when it breaks down prurines-substances that are found naturally in your body, as well as in certain foods such as organ meats, anchioves, herring, asparagus, and mushrooms.  Normally uric acid dissolves in your blood and passes through your kidneys into your urine.  But sometimes your body either produces too much uric acid or your kidneys excrete too little uric acid.  When this happens, uric acid can build up, forming sharp needle-like urate crystals in a joint or surrounding tissue that cause pain, inflammation and swelling.    Gout is characterized by sudden, severe attacks of pain, redness and tenderness in joints, often the joint at the base of the big toe.  Gout is complex form of arthritis that can affect anyone.  Men are more likely to get gout but women become increasingly more susceptible to gout after menopause.  An acute attack of gout can wake you up in the middle of the night with the sensation that your big toe is on fire.  The affected joint is hot, swollen and so tender that even the weight or the sheet on it may seem intolerable.  If you experience symptoms of an acute gout attack it is important to your doctor as soon as the symptoms start.  Gout that goes untreated can lead to worsening pain and joint damage.  Risk Factors:  You are more likely to develop gout if you have high levels of uric acid in your body.    Factors that increase the uric acid level in your body include:  Lifestyle factors.  Excessive alcohol use-generally more than two drinks a day for men and more than one for women increase the risk of gout.  Medical conditions.  Certain conditions make it more likely that you will develop gout.   These include hypertension, and chronic conditions such as diabetes, high levels of fat and cholesterol in the blood, and narrowing of the arteries.  Certain medications.  The uses of Thiazide diuretics- commonly used to treat hypertension and low dose aspirin can also increase uric acid levels.  Family history of gout.  If other members of your family have had gout, you are more likely to develop the disease.  Age and sex. Gout occurs more often in men than it does in women, primarily because women tend to have lower uric acid levels than men do.  Men are more likely to develop gout earlier usually between the ages of 6-50- whereas women generally develop signs and symptoms after menopause.    Tests and diagnosis:  Tests to help diagnose gout may include:  Blood test.  Your doctor may recommend a blood test to measure the uric acid level in your blood .  Blood tests can be misleading, though.  Some people have high uric acid levels but never experience gout.  And some people have signs and symptoms of gout, but don't have unusual levels of uric acid in their blood.  Joint fluid test.  Your doctor may use a needle to draw fluid from your affected joint.  When examined under the microscope, your joint fluid may reveal urate crystals.  Treatment:  Treatment for gout usually involves medications.  What medications you and your doctor choose will be  based on your current health and other medications you currently take.  Gout medications can be used to treat acute gout attacks and prevent future attacks as well as reduce your risk of complications from gout such as the development of tophi from urate crystal deposits.  Alternative medicine:   Certain foods have been studied for their potential to lower uric acid levels, including:  Coffee.  Studies have found an association between coffee drinking (regular and decaf) and lower uric acid levels.  The evidence is not enough to encourage  non-coffee drinkers to start, but it may give clues to new ways of treating gout in the future.  Vitamin C.  Supplements containing vitamin C may reduce the levels of uric acid in your blood.  However, vitamin as a treatment for gout. Don't assume that if a little vitamin C is good, than lots is better.  Megadoses of vitamin C may increase your bodies uric acid levels.  Cherries.  Cherries have been associated with lower levels of uric acid in studies, but it isn't clear if they have any effect on gout signs and symptoms.  Eating more cherries and other dar-colored fruits, such as blackberries, blueberries, purple grapes and raspberries, may be a safe way to support your gout treatment.    Lifestyle/Diet Recommendations:  Drink 8 to 16 cups ( about 2 to 4 liters) of fluid each day, with at least half being water. Avoid alcohol Eat a moderate amount of protein, preferably from healthy sources, such as low-fat or fat-free dairy, tofu, eggs, and nut butters. Limit you daily intake of meat, fish, and poultry to 4 to 6 ounces. Avoid high fat meats and desserts. Decrease you intake of shellfish, beef, lamb, pork, eggs and cheese. Choose a good source of vitamin C daily such as citrus fruits, strawberries, broccoli,  brussel sprouts, papaya, and cantaloupe.  Choose a good source of vitamin A every other day such as yellow fruits, or dark green/yellow vegetables. Avoid drastic weigh reduction or fasting.  If weigh loss is desired lose it over a period of several months. See "dietary considerations.." chart for specific food recommendations.  Dietary Considerations for people with Gout  Food with negligible purine content (0-15 mg of purine nitrogen per 100 grams food)  May use as desired except on calorie variations  Non fat milk Cocoa Cereals (except in list II) Hard candies  Buttermilk Carbonated drinks Vegetables (except in list II) Sherbet  Coffee Fruits Sugar Honey  Tea Cottage Cheese  Gelatin-jell-o Salt  Fruit juice Breads Angel food Cake   Herbs/spices Jams/Jellies [employer]    Foods that do not contain excessive purine content, but must be limited due to fat content  Cream Eggs Oil and Salad Dressing  Half and Half Peanut Butter Chocolate  Whole Milk Cakes Potato Chips  Butter Ice Cream Fried Foods  Cheese Nuts Waffles, pancakes   List II: Food with moderate purine content (50-150 mg of purine nitrogen per 100 grams of food)  Limit total amount each day to 5 oz. cooked Lean meat, other than those on list III   Poultry, other than those on list III Fish, other than those on list III   Seafood, other than those on list III  These foods may be used occasionally  Peas Lentils Bran  Spinach Oatmeal Dried Beans and Peas  Asparagus Wheat Germ Mushrooms   Additional information about meat choices  Choose fish and poultry, particularly without skin, often.  Select lean, well  trimmed cuts of meat.  Avoid all fatty meats, bacon , sausage, fried meats, fried fish, or poultry, luncheon meats, cold cuts, hot dogs, meats canned or frozen in gravy, spareribs and frozen and packaged prepared meats.   List III: Foods with HIGH purine content / Foods to AVOID (150-800 mg of purine nitrogen per 100 grams of food)  Anchovies Herring Meat Broths  Liver Mackerel Meat Extracts  Kidney Scallops Meat Drippings  Sardines Wild Game Mincemeat  Sweetbreads Goose Gravy  Heart Tongue Yeast, baker's and brewers   Commercial soups made with any of the foods listed in List II or List III  In addition avoid all alcoholic beverages  Hip Bursitis Rehab Ask your health care provider which exercises are safe for you. Do exercises exactly as told by your health care provider and adjust them as directed. It is normal to feel mild stretching, pulling, tightness, or discomfort as you do these exercises. Stop right away if you feel sudden pain or your pain gets worse. Do not  begin these exercises until told by your health care provider. Stretching exercise This exercise warms up your muscles and joints and improves the movement and flexibility of your hip. This exercise also helps to relieve pain and stiffness. Iliotibial band stretch An iliotibial band is a strong band of muscle tissue that runs from the outer side of your hip to the outer side of your thigh and knee. Lie on your side with your left / right leg in the top position. Bend your left / right knee and grab your ankle. Stretch out your bottom arm to help you balance. Slowly bring your knee back so your thigh is behind your body. Slowly lower your knee toward the floor until you feel a gentle stretch on the outside of your left / right thigh. If you do not feel a stretch and your knee will not fall farther, place the heel of your other foot on top of your knee and pull your knee down toward the floor with your foot. Hold this position for __________ seconds. Slowly return to the starting position. Repeat __________ times. Complete this exercise __________ times a day. Strengthening exercises These exercises build strength and endurance in your hip and pelvis. Endurance is the ability to use your muscles for a long time, even after they get tired. Bridge This exercise strengthens the muscles that move your thigh backward (hip extensors). Lie on your back on a firm surface with your knees bent and your feet flat on the floor. Tighten your buttocks muscles and lift your buttocks off the floor until your trunk is level with your thighs. Do not arch your back. You should feel the muscles working in your buttocks and the back of your thighs. If you do not feel these muscles, slide your feet 1-2 inches (2.5-5 cm) farther away from your buttocks. If this exercise is too easy, try doing it with your arms crossed over your chest. Hold this position for __________ seconds. Slowly lower your hips to the starting  position. Let your muscles relax completely after each repetition. Repeat __________ times. Complete this exercise __________ times a day. Squats This exercise strengthens the muscles in front of your thigh and knee (quadriceps). Stand in front of a table, with your feet and knees pointing straight ahead. You may rest your hands on the table for balance but not for support. Slowly bend your knees and lower your hips like you are going to sit in a chair.  Keep your weight over your heels, not over your toes. Keep your lower legs upright so they are parallel with the table legs. Do not let your hips go lower than your knees. Do not bend lower than told by your health care provider. If your hip pain increases, do not bend as low. Hold the squat position for __________ seconds. Slowly push with your legs to return to standing. Do not use your hands to pull yourself to standing. Repeat __________ times. Complete this exercise __________ times a day. Hip hike Stand sideways on a bottom step. Stand on your left / right leg with your other foot unsupported next to the step. You can hold on to the railing or wall for balance if needed. Keep your knees straight and your torso square. Then lift your left / right hip up toward the ceiling. Hold this position for __________ seconds. Slowly let your left / right hip lower toward the floor, past the starting position. Your foot should get closer to the floor. Do not lean or bend your knees. Repeat __________ times. Complete this exercise __________ times a day. Single leg stand Without shoes, stand near a railing or in a doorway. You may hold on to the railing or door frame as needed for balance. Squeeze your left / right buttock muscles, then lift up your other foot. Do not let your left / right hip push out to the side. It is helpful to stand in front of a mirror for this exercise so you can watch your hip. Hold this position for __________  seconds. Repeat __________ times. Complete this exercise __________ times a day. This information is not intended to replace advice given to you by your health care provider. Make sure you discuss any questions you have with your health care provider. Document Revised: 12/07/2018 Document Reviewed: 12/07/2018 Elsevier Patient Education  [place].  Back Exercises These exercises help to make your trunk and back strong. They also help to keep the lower back flexible. Doing these exercises can help to prevent or lessen pain in your lower back. If you have back pain, try to do these exercises 2-3 times each day or as told by your doctor. As you get better, do the exercises once each day. Repeat the exercises more often as told by your doctor. To stop back pain from coming back, do the exercises once each day, or as told by your doctor. Do exercises exactly as told by your doctor. Stop right away if you feel sudden pain or your pain gets worse. Exercises Single knee to chest Do these steps 3-5 times in a row for each leg: Lie on your back on a firm bed or the floor with your legs stretched out. Bring one knee to your chest. Grab your knee or thigh with both hands and hold it in place. Pull on your knee until you feel a gentle stretch in your lower back or butt. Keep doing the stretch for 10-30 seconds. Slowly let go of your leg and straighten it. Pelvic tilt Do these steps 5-10 times in a row: Lie on your back on a firm bed or the floor with your legs stretched out. Bend your knees so they point up to the ceiling. Your feet should be flat on the floor. Tighten your lower belly (abdomen) muscles to press your lower back against the floor. This will make your tailbone point up to the ceiling instead of pointing down to your feet or the floor.  Stay in this position for 5-10 seconds while you gently tighten your muscles and breathe evenly. Cat-cow Do these steps until your lower back  bends more easily: Get on your hands and knees on a firm bed or the floor. Keep your hands under your shoulders, and keep your knees under your hips. You may put padding under your knees. Let your head hang down toward your chest. Tighten (contract) the muscles in your belly. Point your tailbone toward the floor so your lower back becomes rounded like the back of a cat. Stay in this position for 5 seconds. Slowly lift your head. Let the muscles of your belly relax. Point your tailbone up toward the ceiling so your back forms a sagging arch like the back of a cow. Stay in this position for 5 seconds.  Press-ups Do these steps 5-10 times in a row: Lie on your belly (face-down) on a firm bed or the floor. Place your hands near your head, about shoulder-width apart. While you keep your back relaxed and keep your hips on the floor, slowly straighten your arms to raise the top half of your body and lift your shoulders. Do not use your back muscles. You may change where you place your hands to make yourself more comfortable. Stay in this position for 5 seconds. Keep your back relaxed. Slowly return to lying flat on the floor.  Bridges Do these steps 10 times in a row: Lie on your back on a firm bed or the floor. Bend your knees so they point up to the ceiling. Your feet should be flat on the floor. Your arms should be flat at your sides, next to your body. Tighten your butt muscles and lift your butt off the floor until your waist is almost as high as your knees. If you do not feel the muscles working in your butt and the back of your thighs, slide your feet 1-2 inches (2.5-5 cm) farther away from your butt. Stay in this position for 3-5 seconds. Slowly lower your butt to the floor, and let your butt muscles relax. If this exercise is too easy, try doing it with your arms crossed over your chest. Belly crunches Do these steps 5-10 times in a row: Lie on your back on a firm bed or the floor with  your legs stretched out. Bend your knees so they point up to the ceiling. Your feet should be flat on the floor. Cross your arms over your chest. Tip your chin a little bit toward your chest, but do not bend your neck. Tighten your belly muscles and slowly raise your chest just enough to lift your shoulder blades a tiny bit off the floor. Avoid raising your body higher than that because it can put too much stress on your lower back. Slowly lower your chest and your head to the floor. Back lifts Do these steps 5-10 times in a row: Lie on your belly (face-down) with your arms at your sides, and rest your forehead on the floor. Tighten the muscles in your legs and your butt. Slowly lift your chest off the floor while you keep your hips on the floor. Keep the back of your head in line with the curve in your back. Look at the floor while you do this. Stay in this position for 3-5 seconds. Slowly lower your chest and your face to the floor. Contact a doctor if: Your back pain gets a lot worse when you do an exercise. Your back pain  does not get better within 2 hours after you exercise. If you have any of these problems, stop doing the exercises. Do not do them again unless your doctor says it is okay. Get help right away if: You have sudden, very bad back pain. If this happens, stop doing the exercises. Do not do them again unless your doctor says it is okay. This information is not intended to replace advice given to you by your health care provider. Make sure you discuss any questions you have with your health care provider. Document Revised: 10/25/2020 Document Reviewed: 10/25/2020 Elsevier Patient Education  [place].

## 2021-09-05 NOTE — Progress Notes (Signed)
CBC and CMP WNL.  Uric acid WNL. No changes in therapy at this time.

## 2021-09-11 ENCOUNTER — Other Ambulatory Visit: Payer: Self-pay | Admitting: Physician Assistant

## 2021-09-11 NOTE — Telephone Encounter (Signed)
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  Uric acid WNL. No changes in therapy at this time.  Okay to refill Allopurinol?

## 2021-11-13 ENCOUNTER — Other Ambulatory Visit: Payer: Self-pay | Admitting: Family Medicine

## 2021-11-13 DIAGNOSIS — Z1231 Encounter for screening mammogram for malignant neoplasm of breast: Secondary | ICD-10-CM

## 2021-11-25 ENCOUNTER — Other Ambulatory Visit: Payer: Self-pay | Admitting: Family Medicine

## 2021-12-10 ENCOUNTER — Other Ambulatory Visit: Payer: Self-pay | Admitting: Physician Assistant

## 2021-12-11 NOTE — Telephone Encounter (Signed)
Next Visit: 03/05/2022 ?  ?Last Visit: 09/04/2021 ?  ?Last Fill: 09/11/2021 ?  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  ?Current Dose per office note 09/04/2021: Allopurinol 100 mg 2 tablets by mouth daily ?  ?Labs: 09/04/2021 CBC and CMP WNL.  Uric acid WNL. No changes in therapy at this time. ?  ?Okay to refill Allopurinol?  ?

## 2021-12-26 ENCOUNTER — Ambulatory Visit (INDEPENDENT_AMBULATORY_CARE_PROVIDER_SITE_OTHER): Payer: Medicare Other

## 2021-12-26 VITALS — Ht 60.0 in | Wt 139.0 lb

## 2021-12-26 DIAGNOSIS — Z0000 Encounter for general adult medical examination without abnormal findings: Secondary | ICD-10-CM | POA: Diagnosis not present

## 2021-12-26 DIAGNOSIS — Z780 Asymptomatic menopausal state: Secondary | ICD-10-CM | POA: Insufficient documentation

## 2021-12-26 DIAGNOSIS — I340 Nonrheumatic mitral (valve) insufficiency: Secondary | ICD-10-CM | POA: Insufficient documentation

## 2021-12-26 DIAGNOSIS — E119 Type 2 diabetes mellitus without complications: Secondary | ICD-10-CM | POA: Insufficient documentation

## 2021-12-26 NOTE — Patient Instructions (Signed)
Ms. Hinchman , ?Thank you for taking time to come for your Medicare Wellness Visit. I appreciate your ongoing commitment to your health goals. Please review the following plan we discussed and let me know if I can assist you in the future.  ? ?Screening recommendations/referrals: ?Colonoscopy: Done 07/04/2017. ?Mammogram: Scheduled for 01/22/2022. ?Bone Density: Discussed. ? ?Recommended yearly ophthalmology/optometry visit for glaucoma screening and checkup ?Recommended yearly dental visit for hygiene and checkup ? ?Vaccinations: ?Influenza vaccine: Due Fall 2023. ?Pneumococcal vaccine: Done 01/11/2018 and 10/24/2010. ?Tdap vaccine: Done 08/26/2004 Repeat in 10 years ? ?Shingles vaccine: Done 05/07/2013, 09/20/2021 and 12/04/2021.   ?Covid-19:Done 06/04/2020, 09/26/2019, 05/30/2020, 05/25/2021 and 12/01/2020. ? ?Advanced directives: Please bring a copy of your health care power of attorney and living will to the office to be added to your chart at your convenience. ? ? ?Conditions/risks identified: Aim for 30 minutes of exercise or brisk walking, 6-8 glasses of water, and 5 servings of fruits and vegetables each day. ? ? ?Next appointment: Follow up in one year for your annual wellness visit 01/01/2023 @ 8:45 AM.  ? ? ?Preventive Care 45 Years and Older, Female ?Preventive care refers to lifestyle choices and visits with your health care provider that can promote health and wellness. ?What does preventive care include? ?A yearly physical exam. This is also called an annual well check. ?Dental exams once or twice a year. ?Routine eye exams. Ask your health care provider how often you should have your eyes checked. ?Personal lifestyle choices, including: ?Daily care of your teeth and gums. ?Regular physical activity. ?Eating a healthy diet. ?Avoiding tobacco and drug use. ?Limiting alcohol use. ?Practicing safe sex. ?Taking low-dose aspirin every day. ?Taking vitamin and mineral supplements as recommended by your health care  provider. ?What happens during an annual well check? ?The services and screenings done by your health care provider during your annual well check will depend on your age, overall health, lifestyle risk factors, and family history of disease. ?Counseling  ?Your health care provider may ask you questions about your: ?Alcohol use. ?Tobacco use. ?Drug use. ?Emotional well-being. ?Home and relationship well-being. ?Sexual activity. ?Eating habits. ?History of falls. ?Memory and ability to understand (cognition). ?Work and work Statistician. ?Reproductive health. ?Screening  ?You may have the following tests or measurements: ?Height, weight, and BMI. ?Blood pressure. ?Lipid and cholesterol levels. These may be checked every 5 years, or more frequently if you are over 23 years old. ?Skin check. ?Lung cancer screening. You may have this screening every year starting at age 49 if you have a 30-pack-year history of smoking and currently smoke or have quit within the past 15 years. ?Fecal occult blood test (FOBT) of the stool. You may have this test every year starting at age 32. ?Flexible sigmoidoscopy or colonoscopy. You may have a sigmoidoscopy every 5 years or a colonoscopy every 10 years starting at age 32. ?Hepatitis C blood test. ?Hepatitis B blood test. ?Sexually transmitted disease (STD) testing. ?Diabetes screening. This is done by checking your blood sugar (glucose) after you have not eaten for a while (fasting). You may have this done every 1-3 years. ?Bone density scan. This is done to screen for osteoporosis. You may have this done starting at age 97. ?Mammogram. This may be done every 1-2 years. Talk to your health care provider about how often you should have regular mammograms. ?Talk with your health care provider about your test results, treatment options, and if necessary, the need for more tests. ?Vaccines  ?Your  health care provider may recommend certain vaccines, such as: ?Influenza vaccine. This is  recommended every year. ?Tetanus, diphtheria, and acellular pertussis (Tdap, Td) vaccine. You may need a Td booster every 10 years. ?Zoster vaccine. You may need this after age 52. ?Pneumococcal 13-valent conjugate (PCV13) vaccine. One dose is recommended after age 64. ?Pneumococcal polysaccharide (PPSV23) vaccine. One dose is recommended after age 45. ?Talk to your health care provider about which screenings and vaccines you need and how often you need them. ?This information is not intended to replace advice given to you by your health care provider. Make sure you discuss any questions you have with your health care provider. ?Document Released: 09/08/2015 Document Revised: 05/01/2016 Document Reviewed: 06/13/2015 ?Elsevier Interactive Patient Education ? 2017 [employer]. ? ?Fall Prevention in the Home ?Falls can cause injuries. They can happen to people of all ages. There are many things you can do to make your home safe and to help prevent falls. ?What can I do on the outside of my home? ?Regularly fix the edges of walkways and driveways and fix any cracks. ?Remove anything that might make you trip as you walk through a door, such as a raised step or threshold. ?Trim any bushes or trees on the path to your home. ?Use bright outdoor lighting. ?Clear any walking paths of anything that might make someone trip, such as rocks or tools. ?Regularly check to see if handrails are loose or broken. Make sure that both sides of any steps have handrails. ?Any raised decks and porches should have guardrails on the edges. ?Have any leaves, snow, or ice cleared regularly. ?Use sand or salt on walking paths during winter. ?Clean up any spills in your garage right away. This includes oil or grease spills. ?What can I do in the bathroom? ?Use night lights. ?Install grab bars by the toilet and in the tub and shower. Do not use towel bars as grab bars. ?Use non-skid mats or decals in the tub or shower. ?If you need to sit down in  the shower, use a plastic, non-slip stool. ?Keep the floor dry. Clean up any water that spills on the floor as soon as it happens. ?Remove soap buildup in the tub or shower regularly. ?Attach bath mats securely with double-sided non-slip rug tape. ?Do not have throw rugs and other things on the floor that can make you trip. ?What can I do in the bedroom? ?Use night lights. ?Make sure that you have a light by your bed that is easy to reach. ?Do not use any sheets or blankets that are too big for your bed. They should not hang down onto the floor. ?Have a firm chair that has side arms. You can use this for support while you get dressed. ?Do not have throw rugs and other things on the floor that can make you trip. ?What can I do in the kitchen? ?Clean up any spills right away. ?Avoid walking on wet floors. ?Keep items that you use a lot in easy-to-reach places. ?If you need to reach something above you, use a strong step stool that has a grab bar. ?Keep electrical cords out of the way. ?Do not use floor polish or wax that makes floors slippery. If you must use wax, use non-skid floor wax. ?Do not have throw rugs and other things on the floor that can make you trip. ?What can I do with my stairs? ?Do not leave any items on the stairs. ?Make sure that there are handrails  on both sides of the stairs and use them. Fix handrails that are broken or loose. Make sure that handrails are as long as the stairways. ?Check any carpeting to make sure that it is firmly attached to the stairs. Fix any carpet that is loose or worn. ?Avoid having throw rugs at the top or bottom of the stairs. If you do have throw rugs, attach them to the floor with carpet tape. ?Make sure that you have a light switch at the top of the stairs and the bottom of the stairs. If you do not have them, ask someone to add them for you. ?What else can I do to help prevent falls? ?Wear shoes that: ?Do not have high heels. ?Have rubber bottoms. ?Are comfortable  and fit you well. ?Are closed at the toe. Do not wear sandals. ?If you use a stepladder: ?Make sure that it is fully opened. Do not climb a closed stepladder. ?Make sure that both sides of the stepladder are locked i

## 2021-12-26 NOTE — Progress Notes (Signed)
? ?Subjective:  ? Cindy [name] is a 76 y.o. female who presents for Medicare Annual (Subsequent) preventive examination. ?Virtual Visit via Telephone Note ? ?I connected with  Ranell Patrick on 12/26/21 at  8:45 AM EDT by telephone and verified that I am speaking with the correct person using two identifiers. ? ?Location: ?Patient: HOME ?Provider: [facility] ?Persons participating in the virtual visit: patient/Nurse Health Advisor ?  ?I discussed the limitations, risks, security and privacy concerns of performing an evaluation and management service by telephone and the availability of in person appointments. The patient expressed understanding and agreed to proceed. ? ?Interactive audio and video telecommunications were attempted between this nurse and patient, however failed, due to patient having technical difficulties OR patient did not have access to video capability.  We continued and completed visit with audio only. ? ?Some vital signs may be absent or patient reported.  ? ?Chriss Driver, LPN ? ?Review of Systems    ? ?Cardiac Risk Factors include: advanced age (>14mn, >>49women);hypertension;dyslipidemia;sedentary lifestyle ? ?   ?Objective:  ?  ?Today's Vitals  ? 12/26/21 0850  ?Weight: 139 lb (63 kg)  ?Height: 5' (1.524 m)  ? ?Body mass index is 27.15 kg/m?. ? ? ?  12/26/2021  ?  8:57 AM 03/31/2020  ?  9:50 AM 03/16/2018  ? 11:18 AM 07/04/2017  ?  8:55 AM 03/11/2017  ?  9:44 AM 03/07/2016  ?  2:01 PM  ?Advanced Directives  ?Does Patient Have a Medical Advance Directive? Yes Yes Yes Yes Yes Yes  ?Type of AParamedicof [place]Living will [place]Living will [place]Living will [place]achtree CornersLiving will HR[place]Living will HCaspianLiving will  ?Does patient want to make changes to medical advance directive?      No - Patient declined  ?Copy of [place] Chart? Yes -  validated most recent copy scanned in chart (See row information) No - copy requested No - copy requested  No - copy requested No - copy requested  ? ? ?Current Medications (verified) ?Outpatient Encounter Medications as of 12/26/2021  ?Medication Sig  ? allopurinol (ZYLOPRIM) 100 MG tablet TAKE 2 TABLETS(200 MG) BY MOUTH DAILY  ? atenolol (TENORMIN) 25 MG tablet TAKE 1 TABLET(25 MG) BY MOUTH DAILY  ? atorvastatin (LIPITOR) 20 MG tablet TAKE 1 TABLET(20 MG) BY MOUTH DAILY  ? Cholecalciferol (VITAMIN D-3) 1000 units CAPS Take 1 capsule by mouth daily.  ? colchicine 0.6 MG tablet Take 1 tablet (0.6 mg total) by mouth daily as needed.  ? COVID-19 mRNA bivalent vaccine, Pfizer, (PFIZER COVID-19 VAC BIVALENT) injection Inject into the muscle.  ? Loperamide HCl (IMODIUM A-D PO) Take by mouth as needed.  ? losartan (COZAAR) 100 MG tablet TAKE 1 TABLET(100 MG) BY MOUTH DAILY  ? triamcinolone cream (KENALOG) 0.1 % Apply 1 application topically 2 (two) times daily as needed.  ? ?No facility-administered encounter medications on file as of 12/26/2021.  ? ? ?Allergies (verified) ?Ampicillin, Calcium, Cephalexin, and Colchicine  ? ?History: ?Past Medical History:  ?Diagnosis Date  ? Benign neoplasm of colon   ? Gout   ? MVP (mitral valve prolapse)   ? very mild  ? Other abnormal glucose   ? Other and unspecified hyperlipidemia   ? Personal history of other malignant neoplasm of skin   ? Unspecified essential hypertension   ? ?Past Surgical History:  ?Procedure Laterality Date  ?  CARDIAC CATHETERIZATION  1995  ? normal (after abnormal stress test)  ? COLONOSCOPY  07/01/03  ? polyps  ? MOHS SURGERY  11/09  ? for basal cell lesion  ? ?Family History  ?Problem Relation Age of Onset  ? Macular degeneration Mother   ? Sudden death Father 2  ? Heart disease Brother   ? Gout Brother   ? Arthritis Brother   ? Healthy Daughter   ? Colon cancer Neg Hx   ? Esophageal cancer Neg Hx   ? Rectal cancer Neg Hx   ? Stomach cancer Neg Hx   ? ?Social  History  ? ?Socioeconomic History  ? Marital status: Widowed  ?  Spouse name: Not on file  ? Number of children: 1  ? Years of education: Not on file  ? Highest education level: Not on file  ?Occupational History  ? Occupation: retired  ?Tobacco Use  ? Smoking status: Never  ? Smokeless tobacco: Never  ?Vaping Use  ? Vaping Use: Never used  ?Substance and Sexual Activity  ? Alcohol use: Yes  ?  Alcohol/week: 0.0 standard drinks  ?  Comment: occasional  ? Drug use: No  ? Sexual activity: Never  ?Other Topics Concern  ? Not on file  ?Social History Narrative  ? Retired-med tech  ? Husband had advanced parkinson's and dementia, passed away in Dec 06, 2012.  ? No regular exercise  ? ?Social Determinants of Health  ? ?Financial Resource Strain: Low Risk   ? Difficulty of Paying Living Expenses: Not hard at all  ?Food Insecurity: No Food Insecurity  ? Worried About Charity fundraiser in the Last Year: Never true  ? Ran Out of Food in the Last Year: Never true  ?Transportation Needs: No Transportation Needs  ? Lack of Transportation (Medical): No  ? Lack of Transportation (Non-Medical): No  ?Physical Activity: Inactive  ? Days of Exercise per Week: 0 days  ? Minutes of Exercise per Session: 0 min  ?Stress: No Stress Concern Present  ? Feeling of Stress : Not at all  ?Social Connections: Moderately Integrated  ? Frequency of Communication with Friends and Family: More than three times a week  ? Frequency of Social Gatherings with Friends and Family: More than three times a week  ? Attends Religious Services: 1 to 4 times per year  ? Active Member of Clubs or Organizations: Yes  ? Attends Archivist Meetings: More than 4 times per year  ? Marital Status: Widowed  ? ? ?Tobacco Counseling ?Counseling given: Not Answered ? ? ?Clinical Intake: ? ?Pre-visit preparation completed: Yes ? ?Pain : No/denies pain ? ?  ? ?BMI - recorded: 27.15 ?Nutritional Status: BMI 25 -29 Overweight ?Nutritional Risks: None ?Diabetes: No ? ?How  often do you need to have someone help you when you read instructions, pamphlets, or other written materials from your doctor or pharmacy?: 1 - Never ? ?Diabetic?NO ? ?Interpreter Needed?: No ? ?Information entered by :: mj Delaila Nand, lpn ? ? ?Activities of Daily Living ? ?  12/26/2021  ?  8:59 AM 04/04/2021  ?  9:30 AM  ?In your present state of health, do you have any difficulty performing the following activities:  ?Hearing? 0 0  ?Vision? 0 0  ?Difficulty concentrating or making decisions? 0 0  ?Walking or climbing stairs? 0 0  ?Dressing or bathing? 0 0  ?Doing errands, shopping? 0 0  ?Preparing Food and eating ? N   ?Using the Toilet? N   ?  In the past six months, have you accidently leaked urine? N   ?Do you have problems with loss of bowel control? N   ?Managing your Medications? N   ?Managing your Finances? N   ?Housekeeping or managing your Housekeeping? N   ? ? ?Patient Care Team: ?Tower, Wynelle Fanny, MD as PCP - General ?Agapito Games as Referring Physician (Optometry) ?Druscilla Brownie, MD as Consulting Physician (Dermatology) ? ?Indicate any recent Medical Services you may have received from other than [facility] providers in the past year (date may be approximate). ? ?   ?Assessment:  ? This is a routine wellness examination for Arkie. ? ?Hearing/Vision screen ?Hearing Screening - Comments:: No hearing issues.  ?Vision Screening - Comments:: Glasses. [facility]. ? ?Dietary issues and exercise activities discussed: ?Current Exercise Habits: The patient does not participate in regular exercise at present, Exercise limited by: cardiac condition(s);orthopedic condition(s) ? ? Goals Addressed   ? ?  ?  ?  ?  ?  ? This Visit's Progress  ?   Increase physical activity (pt-stated)   Not on track  ?   Starting 03/11/2017, I will resume exercise as tolerated for at least 30 min 2-3 days per week.  ? ?  ? ?  ? ?Depression Screen ? ?  12/26/2021  ?  8:53 AM 04/04/2021  ?  9:35 AM 04/04/2021  ?  9:29 AM 03/31/2020  ?  9:54 AM  03/29/2019  ? 11:22 AM 03/16/2018  ? 11:18 AM 03/11/2017  ?  9:44 AM  ?PHQ 2/9 Scores  ?PHQ - 2 Score 0 0 0 0 0 0 0  ?PHQ- 9 Score  0  0  0   ?  ?Fall Risk ? ?  12/26/2021  ?  8:58 AM 04/04/2021  ?  9:29 AM 8/6/2

## 2022-01-15 ENCOUNTER — Ambulatory Visit: Payer: Medicare Other

## 2022-01-22 ENCOUNTER — Ambulatory Visit: Payer: Medicare Other

## 2022-01-29 ENCOUNTER — Ambulatory Visit
Admission: RE | Admit: 2022-01-29 | Discharge: 2022-01-29 | Disposition: A | Discharge status: Home / Self Care | Payer: Medicare Other | Source: Ambulatory Visit | Attending: Family Medicine | Admitting: Family Medicine

## 2022-01-29 DIAGNOSIS — Z1231 Encounter for screening mammogram for malignant neoplasm of breast: Secondary | ICD-10-CM | POA: Diagnosis not present

## 2022-02-27 NOTE — Progress Notes (Signed)
Office Visit Note  Patient: Cindy [name]             Date of Birth: [birth year]           MRN: [id number]             PCP: Abner Greenspan, MD Referring: Tower, Wynelle Fanny, MD Visit Date: 03/05/2022 Occupation: '@GUAROCC'$ @  Subjective:  Medication management  History of Present Illness: Cindy [name] is a 76 y.o. female with history of gout and osteoarthritis.  She states she has been taking allopurinol 200 mg p.o. daily.  She has not had a gout flare in a long time.  She did not have to take colchicine in a long time.  As she continues to have some discomfort in her lower back and her trochanteric area.  She has off-and-on stiffness in her hands.  She states when she goes for long walks that is when her back starts hurting.  Activities of Daily Living:  Patient reports morning stiffness for 15 minutes.   Patient Denies nocturnal pain.  Difficulty dressing/grooming: Denies Difficulty climbing stairs: Reports Difficulty getting out of chair: Denies Difficulty using hands for taps, buttons, cutlery, and/or writing: Denies  Review of Systems  Constitutional:  Negative for fatigue.  HENT:  Negative for mouth sores, mouth dryness and nose dryness.   Eyes:  Negative for pain, itching and dryness.  Respiratory:  Negative for shortness of breath and difficulty breathing.   Cardiovascular:  Negative for chest pain and palpitations.  Gastrointestinal:  Negative for blood in stool, constipation and diarrhea.  Endocrine: Negative for increased urination.  Genitourinary:  Negative for difficulty urinating.  Musculoskeletal:  Positive for joint pain, joint pain and morning stiffness. Negative for joint swelling, myalgias, muscle tenderness and myalgias.  Skin:  Negative for color change, rash and redness.  Allergic/Immunologic: Negative for susceptible to infections.  Neurological:  Negative for dizziness, numbness, headaches, memory loss and weakness.  Hematological:  Negative for  bruising/bleeding tendency.  Psychiatric/Behavioral:  Negative for confusion.     PMFS History:  Patient Active Problem List   Diagnosis Date Noted   Mitral valve insufficiency 12/26/2021   Postmenopausal status 12/26/2021   DM (diabetes mellitus) ([place]) 12/26/2021   Gout 02/02/2019   Colon cancer screening 03/11/2017   Routine general medical examination at a health care facility 02/28/2015   Encounter for Medicare annual wellness exam 02/03/2013   Osteopenia 02/03/2012   Prediabetes 10/10/2010   COLONIC POLYPS 03/05/2010   Hyperlipidemia 07/27/2009   Essential hypertension 07/27/2009   BASAL CELL CARCINOMA, HX OF 07/27/2009    Past Medical History:  Diagnosis Date   Benign neoplasm of colon    Gout    MVP (mitral valve prolapse)    very mild   Other abnormal glucose    Other and unspecified hyperlipidemia    Personal history of other malignant neoplasm of skin    Unspecified essential hypertension     Family History  Problem Relation Age of Onset   Macular degeneration Mother    Sudden death Father 42   Heart disease Brother    Gout Brother    Arthritis Brother    Healthy Daughter    Colon cancer Neg Hx    Esophageal cancer Neg Hx    Rectal cancer Neg Hx    Stomach cancer Neg Hx    Past Surgical History:  Procedure Laterality Date   CARDIAC CATHETERIZATION  1995   normal (after abnormal stress test)   COLONOSCOPY  07/01/03   polyps   MOHS SURGERY  11/09   for basal cell lesion   Social History   Social History Narrative   Retired-med tech   Husband had advanced parkinson's and dementia, passed away in 11-07-12.   No regular exercise   Immunization History  Administered Date(s) Administered   Fluad Quad(high Dose 65+) 05/25/2019   Hepatitis B 07/03/1988, 07/30/1988, 01/01/1989   Influenza Split 06/04/2011   Influenza Whole 04/26/2009, 05/26/2009, 05/28/2010, 06/02/2012   Influenza, High Dose Seasonal PF 06/17/2018   Influenza,inj,Quad PF,6+ Mos  06/18/2017   Influenza-Unspecified 06/01/2013, 05/27/2014, 05/26/2018   PFIZER Comirnaty(Gray Top)Covid-19 Tri-Sucrose Vaccine 12/01/2020   PFIZER(Purple Top)SARS-COV-2 Vaccination 09/05/2019, 09/26/2019, 05/30/2020   Pfizer Covid-19 Vaccine Bivalent Booster 91yr & up 05/25/2021   Pneumococcal Conjugate-13 01/12/2015   Pneumococcal Polysaccharide-23 10/24/2010   Td 08/26/2004   Zoster, Live 05/07/2013     Objective: Vital Signs: BP (!) 166/91 (BP Location: Left Arm, Patient Position: Sitting, Cuff Size: Normal)   Pulse 64   Ht 5' (1.524 m)   Wt 142 lb 3.2 oz (64.5 kg)   BMI 27.77 kg/m    Physical Exam Vitals and nursing note reviewed.  Constitutional:      Appearance: She is well-developed.  HENT:     Head: Normocephalic and atraumatic.  Eyes:     Conjunctiva/sclera: Conjunctivae normal.  Cardiovascular:     Rate and Rhythm: Normal rate and regular rhythm.     Heart sounds: Normal heart sounds.  Pulmonary:     Effort: Pulmonary effort is normal.     Breath sounds: Normal breath sounds.  Abdominal:     General: Bowel sounds are normal.     Palpations: Abdomen is soft.  Musculoskeletal:     Cervical back: Normal range of motion.  Lymphadenopathy:     Cervical: No cervical adenopathy.  Skin:    General: Skin is warm and dry.     Capillary Refill: Capillary refill takes less than 2 seconds.  Neurological:     Mental Status: She is alert and oriented to person, place, and time.  Psychiatric:        Behavior: Behavior normal.      Musculoskeletal Exam: C-spine was in good range of motion.  She had thoracic kyphosis.  She had limited range of motion of her thoracic and lumbar spine.  Shoulder joints, elbow joints, wrist joints with good range of motion.  She had bilateral PIP and DIP thickening with no synovitis.  She had limited abduction of her hip joints without discomfort.  Knee joints with good range of motion.  There was no tenderness over ankles or MTPs.  CDAI  Exam: CDAI Score: -- Patient Global: --; Provider Global: -- Swollen: --; Tender: -- Joint Exam 03/05/2022   No joint exam has been documented for this visit   There is currently no information documented on the homunculus. Go to the Rheumatology activity and complete the homunculus joint exam.  Investigation: No additional findings.  Imaging: No results found.  Recent Labs: Lab Results  Component Value Date   WBC 8.4 09/04/2021   HGB 14.0 09/04/2021   PLT 333 09/04/2021   NA 139 09/04/2021   K 4.0 09/04/2021   CL 101 09/04/2021   CO2 31 09/04/2021   GLUCOSE 104 (H) 09/04/2021   BUN 14 09/04/2021   CREATININE 0.92 09/04/2021   BILITOT 0.4 09/04/2021   ALKPHOS 70 03/29/2019   AST 25 09/04/2021   ALT 26 09/04/2021   PROT 7.3  09/04/2021   ALBUMIN 4.4 03/29/2019   CALCIUM 9.6 09/04/2021   GFRAA 76 08/30/2020    Speciality Comments: No specialty comments available.  Procedures:  No procedures performed Allergies: Ampicillin, Calcium, Cephalexin, and Colchicine   Assessment / Plan:     Visit Diagnoses: Chronic idiopathic gout involving toe without tophus, unspecified laterality - uric acid: 09/04/2021 3.4 -patient denies having any gout flares.  She has been taking allopurinol 200 mg p.o. daily.  She has been tolerating allopurinol without any side effects.  I will check uric acid level today.  Plan: Uric acid  Hyperuricemia-uric acid level was 3.4 on September 04, 2021.  Medication monitoring encounter - Allopurinol 100 mg 2 tablets by mouth daily, colchicine 0.'6mg'$  1 tablet by mouth daily as needed.  -Labs obtained on September 04, 2021 showed normal CBC and CMP with GFR.  Plan: CBC with Differential/Platelet, COMPLETE METABOLIC PANEL WITH GFR  Primary osteoarthritis of both hands-joint protection muscle strengthening was discussed.  Trochanteric bursitis of both hips-she continues to have intermittent trochanteric bursitis which gets worse with prolonged walking.  I  offered physical therapy which she declined.  She will contact me when she is ready for physical therapy.  A handout on exercises was given.  Primary osteoarthritis of both feet-proper fitting shoes were advised.  Chronic midline low back pain without sciatica-she continues to have some lower back discomfort.  Handout on back exercises was given.  She declined physical therapy.  Postural kyphosis of thoracic region-exercises were demonstrated in the office.  Patient states she was told that she has osteopenia.  She is not interested in having repeat bone density.  She states she would not like to take any treatment for osteoporosis.  Essential hypertension-her blood pressure was elevated today.  She was advised to monitor blood pressure closely and follow-up with her PCP if needed.  Other medical problems are listed as follows:  History of hyperlipidemia  Prediabetes  Hx of colonic polyps  History of basal cell carcinoma  History of alcohol use-patient admits alcohol consumption.  Orders: Orders Placed This Encounter  Procedures   CBC with Differential/Platelet   COMPLETE METABOLIC PANEL WITH GFR   Uric acid   No orders of the defined types were placed in this encounter.    Follow-Up Instructions: Return in about 6 months (around 09/05/2022) for Osteoarthritis, Gout.   Bo Merino, MD  Note - This record has been created using Editor, commissioning.  Chart creation errors have been sought, but may not always  have been located. Such creation errors do not reflect on  the standard of medical care.

## 2022-03-05 ENCOUNTER — Encounter: Payer: Self-pay | Admitting: Rheumatology

## 2022-03-05 ENCOUNTER — Ambulatory Visit (INDEPENDENT_AMBULATORY_CARE_PROVIDER_SITE_OTHER): Payer: Medicare Other | Admitting: Rheumatology

## 2022-03-05 VITALS — BP 166/91 | HR 64 | Ht 60.0 in | Wt 142.2 lb

## 2022-03-05 DIAGNOSIS — R7303 Prediabetes: Secondary | ICD-10-CM

## 2022-03-05 DIAGNOSIS — M19041 Primary osteoarthritis, right hand: Secondary | ICD-10-CM | POA: Diagnosis not present

## 2022-03-05 DIAGNOSIS — M19072 Primary osteoarthritis, left ankle and foot: Secondary | ICD-10-CM

## 2022-03-05 DIAGNOSIS — M4004 Postural kyphosis, thoracic region: Secondary | ICD-10-CM

## 2022-03-05 DIAGNOSIS — M545 Low back pain, unspecified: Secondary | ICD-10-CM

## 2022-03-05 DIAGNOSIS — Z860100 Personal history of colon polyps, unspecified: Secondary | ICD-10-CM

## 2022-03-05 DIAGNOSIS — Z85828 Personal history of other malignant neoplasm of skin: Secondary | ICD-10-CM

## 2022-03-05 DIAGNOSIS — E790 Hyperuricemia without signs of inflammatory arthritis and tophaceous disease: Secondary | ICD-10-CM

## 2022-03-05 DIAGNOSIS — M7062 Trochanteric bursitis, left hip: Secondary | ICD-10-CM

## 2022-03-05 DIAGNOSIS — Z5181 Encounter for therapeutic drug level monitoring: Secondary | ICD-10-CM | POA: Diagnosis not present

## 2022-03-05 DIAGNOSIS — M19071 Primary osteoarthritis, right ankle and foot: Secondary | ICD-10-CM

## 2022-03-05 DIAGNOSIS — M1A0790 Idiopathic chronic gout, unspecified ankle and foot, without tophus (tophi): Secondary | ICD-10-CM

## 2022-03-05 DIAGNOSIS — M19042 Primary osteoarthritis, left hand: Secondary | ICD-10-CM

## 2022-03-05 DIAGNOSIS — G8929 Other chronic pain: Secondary | ICD-10-CM

## 2022-03-05 DIAGNOSIS — I10 Essential (primary) hypertension: Secondary | ICD-10-CM

## 2022-03-05 DIAGNOSIS — M7061 Trochanteric bursitis, right hip: Secondary | ICD-10-CM

## 2022-03-05 DIAGNOSIS — Z87898 Personal history of other specified conditions: Secondary | ICD-10-CM

## 2022-03-05 DIAGNOSIS — Z8639 Personal history of other endocrine, nutritional and metabolic disease: Secondary | ICD-10-CM

## 2022-03-05 NOTE — Patient Instructions (Addendum)
Back Exercises The following exercises strengthen the muscles that help to support the trunk (torso) and back. They also help to keep the lower back flexible. Doing these exercises can help to prevent or lessen existing low back pain. If you have back pain or discomfort, try doing these exercises 2-3 times each day or as told by your health care provider. As your pain improves, do them once each day, but increase the number of times that you repeat the steps for each exercise (do more repetitions). To prevent the recurrence of back pain, continue to do these exercises once each day or as told by your health care provider. Do exercises exactly as told by your health care provider and adjust them as directed. It is normal to feel mild stretching, pulling, tightness, or discomfort as you do these exercises, but you should stop right away if you feel sudden pain or your pain gets worse. Exercises Single knee to chest Repeat these steps 3-5 times for each leg: Lie on your back on a firm bed or the floor with your legs extended. Bring one knee to your chest. Your other leg should stay extended and in contact with the floor. Hold your knee in place by grabbing your knee or thigh with both hands and hold. Pull on your knee until you feel a gentle stretch in your lower back or buttocks. Hold the stretch for 10-30 seconds. Slowly release and straighten your leg.  Pelvic tilt Repeat these steps 5-10 times: Lie on your back on a firm bed or the floor with your legs extended. Bend your knees so they are pointing toward the ceiling and your feet are flat on the floor. Tighten your lower abdominal muscles to press your lower back against the floor. This motion will tilt your pelvis so your tailbone points up toward the ceiling instead of pointing to your feet or the floor. With gentle tension and even breathing, hold this position for 5-10 seconds.  Cat-cow Repeat these steps until your lower back becomes  more flexible: Get into a hands-and-knees position on a firm bed or the floor. Keep your hands under your shoulders, and keep your knees under your hips. You may place padding under your knees for comfort. Let your head hang down toward your chest. Contract your abdominal muscles and point your tailbone toward the floor so your lower back becomes rounded like the back of a cat. Hold this position for 5 seconds. Slowly lift your head, let your abdominal muscles relax, and point your tailbone up toward the ceiling so your back forms a sagging arch like the back of a cow. Hold this position for 5 seconds.  Press-ups Repeat these steps 5-10 times: Lie on your abdomen (face-down) on a firm bed or the floor. Place your palms near your head, about shoulder-width apart. Keeping your back as relaxed as possible and keeping your hips on the floor, slowly straighten your arms to raise the top half of your body and lift your shoulders. Do not use your back muscles to raise your upper torso. You may adjust the placement of your hands to make yourself more comfortable. Hold this position for 5 seconds while you keep your back relaxed. Slowly return to lying flat on the floor.  Bridges Repeat these steps 10 times: Lie on your back on a firm bed or the floor. Bend your knees so they are pointing toward the ceiling and your feet are flat on the floor. Your arms should be flat   at your sides, next to your body. Tighten your buttocks muscles and lift your buttocks off the floor until your waist is at almost the same height as your knees. You should feel the muscles working in your buttocks and the back of your thighs. If you do not feel these muscles, slide your feet 1-2 inches (2.5-5 cm) farther away from your buttocks. Hold this position for 3-5 seconds. Slowly lower your hips to the starting position, and allow your buttocks muscles to relax completely. If this exercise is too easy, try doing it with your arms  crossed over your chest. Abdominal crunches Repeat these steps 5-10 times: Lie on your back on a firm bed or the floor with your legs extended. Bend your knees so they are pointing toward the ceiling and your feet are flat on the floor. Cross your arms over your chest. Tip your chin slightly toward your chest without bending your neck. Tighten your abdominal muscles and slowly raise your torso high enough to lift your shoulder blades a tiny bit off the floor. Avoid raising your torso higher than that because it can put too much stress on your lower back and does not help to strengthen your abdominal muscles. Slowly return to your starting position.  Back lifts Repeat these steps 5-10 times: Lie on your abdomen (face-down) with your arms at your sides, and rest your forehead on the floor. Tighten the muscles in your legs and your buttocks. Slowly lift your chest off the floor while you keep your hips pressed to the floor. Keep the back of your head in line with the curve in your back. Your eyes should be looking at the floor. Hold this position for 3-5 seconds. Slowly return to your starting position.  Contact a health care provider if: Your back pain or discomfort gets much worse when you do an exercise. Your worsening back pain or discomfort does not lessen within 2 hours after you exercise. If you have any of these problems, stop doing these exercises right away. Do not do them again unless your health care provider says that you can. Get help right away if: You develop sudden, severe back pain. If this happens, stop doing the exercises right away. Do not do them again unless your health care provider says that you can. This information is not intended to replace advice given to you by your health care provider. Make sure you discuss any questions you have with your health care provider. Document Revised: 02/06/2021 Document Reviewed: 10/25/2020 Elsevier Patient Education  [facility] Band Syndrome [facility] Ask your health care provider which exercises are safe for you. Do exercises exactly as told by your health care provider and adjust them as directed. It is normal to feel mild stretching, pulling, tightness, or discomfort as you do these exercises. Stop right away if you feel sudden pain or your pain gets significantly worse. Do not begin these exercises until told by your health care provider. Stretching and range-of-motion exercises These exercises warm up your muscles and joints and improve the movement and flexibility of your hip and pelvis. Quadriceps stretch, prone  Lie on your abdomen (prone position) on a firm surface, such as a bed or padded floor. Bend your left / right knee and reach back to hold your ankle or pant leg. If you cannot reach your ankle or pant leg, loop a belt around your foot and grab the belt instead. Gently pull your heel toward your buttocks. Your knee  should not slide out to the side. You should feel a stretch in the front of your thigh and knee (quadriceps). Hold this position for __________ seconds. Repeat __________ times. Complete this exercise __________ times a day. Iliotibial band stretch An iliotibial band is a strong band of muscle tissue that runs from the outer side of your hip to the outer side of your thigh and knee. Lie on your side with your left / right leg in the top position. Bend both of your knees and grab your left / right ankle. Stretch out your bottom arm to help you balance. Slowly bring your top knee back so your thigh goes behind your trunk. Slowly lower your top leg toward the floor until you feel a gentle stretch on the outside of your left / right hip and thigh. If you do not feel a stretch and your knee will not fall farther, place the heel of your other foot on top of your knee and pull your knee down toward the floor with your foot. Hold this position for __________ seconds. Repeat __________ times.  Complete this exercise __________ times a day. Strengthening exercises These exercises build strength and endurance in your hip and pelvis. Endurance is the ability to use your muscles for a long time, even after they get tired. Straight leg raises, side-lying This exercise strengthens the muscles that rotate the leg at the hip and move it away from your body (hip abductors). Lie on your side with your left / right leg in the top position. Lie so your head, shoulder, hip, and knee line up. You may bend your bottom knee to help you balance. Roll your hips slightly forward so your hips are stacked directly over each other and your left / right knee is facing forward. Tense the muscles in your outer thigh and lift your top leg 4-6 inches (10-15 cm). Hold this position for __________ seconds. Slowly lower your leg to return to the starting position. Let your muscles relax completely before doing another repetition. Repeat __________ times. Complete this exercise __________ times a day. Leg raises, prone This exercise strengthens the muscles that move the hips backward (hip extensors). Lie on your abdomen (prone position) on your bed or a firm surface. You can put a pillow under your hips if that is more comfortable for your lower back. Bend your left / right knee so your foot is straight up in the air. Squeeze your buttocks muscles and lift your left / right thigh off the bed. Do not let your back arch. Tense your thigh muscle as hard as you can without increasing any knee pain. Hold this position for __________ seconds. Slowly lower your leg to return to the starting position and allow it to relax completely. Repeat __________ times. Complete this exercise __________ times a day. Hip hike Stand sideways on a bottom step. Stand on your left / right leg with your other foot unsupported next to the step. You can hold on to a railing or wall for balance if needed. Keep your knees straight and your  torso square. Then lift your left / right hip up toward the ceiling. Slowly let your left / right hip lower toward the floor, past the starting position. Your foot should get closer to the floor. Do not lean or bend your knees. Repeat __________ times. Complete this exercise __________ times a day. This information is not intended to replace advice given to you by your health care provider. Make sure you  discuss any questions you have with your health care provider. Document Revised: 10/20/2019 Document Reviewed: 10/20/2019 Elsevier Patient Education  [place].

## 2022-03-06 LAB — COMPLETE METABOLIC PANEL WITH GFR
AG Ratio: 1.5 (calc) (ref 1.0–2.5)
ALT: 20 U/L (ref 6–29)
AST: 22 U/L (ref 10–35)
Albumin: 4.3 g/dL (ref 3.6–5.1)
Alkaline phosphatase (APISO): 78 U/L (ref 37–153)
BUN: 19 mg/dL (ref 7–25)
CO2: 27 mmol/L (ref 20–32)
Calcium: 9.8 mg/dL (ref 8.6–10.4)
Chloride: 103 mmol/L (ref 98–110)
Creat: 0.95 mg/dL (ref 0.60–1.00)
Globulin: 2.8 g/dL (calc) (ref 1.9–3.7)
Glucose, Bld: 117 mg/dL — ABNORMAL HIGH (ref 65–99)
Potassium: 4.2 mmol/L (ref 3.5–5.3)
Sodium: 138 mmol/L (ref 135–146)
Total Bilirubin: 0.6 mg/dL (ref 0.2–1.2)
Total Protein: 7.1 g/dL (ref 6.1–8.1)
eGFR: 62 mL/min/{1.73_m2} (ref >=60)

## 2022-03-06 LAB — CBC WITH DIFFERENTIAL/PLATELET
Absolute Monocytes: 912 cells/uL (ref 200–950)
Basophils Absolute: 84 cells/uL (ref 0–200)
Basophils Relative: 1.1 %
Eosinophils Absolute: 122 cells/uL (ref 15–500)
Eosinophils Relative: 1.6 %
HCT: 39.6 % (ref 35.0–45.0)
Hemoglobin: 13.5 g/dL (ref 11.7–15.5)
Lymphs Abs: 2257 cells/uL (ref 850–3900)
MCH: 33 pg (ref 27.0–33.0)
MCHC: 34.1 g/dL (ref 32.0–36.0)
MCV: 96.8 fL (ref 80.0–100.0)
MPV: 10.4 fL (ref 7.5–12.5)
Monocytes Relative: 12 %
Neutro Abs: 4226 cells/uL (ref 1500–7800)
Neutrophils Relative %: 55.6 %
Platelets: 280 10*3/uL (ref 140–400)
RBC: 4.09 10*6/uL (ref 3.80–5.10)
RDW: 13.5 % (ref 11.0–15.0)
Total Lymphocyte: 29.7 %
WBC: 7.6 10*3/uL (ref 3.8–10.8)

## 2022-03-06 LAB — URIC ACID: Uric Acid, Serum: 3.6 mg/dL (ref 2.5–7.0)

## 2022-03-06 NOTE — Progress Notes (Signed)
CBC and CMP are normal.  Uric acid is in desirable range.

## 2022-03-10 ENCOUNTER — Other Ambulatory Visit: Payer: Self-pay | Admitting: Physician Assistant

## 2022-03-11 NOTE — Telephone Encounter (Signed)
Next Visit: 09/06/2022  Last Visit: 03/05/2022  Last Fill: 12/11/2021  DX: Chronic idiopathic gout involving toe without tophus, unspecified laterality   Current Dose per office note 03/05/2022: Allopurinol 100 mg 2 tablets by mouth daily  Labs: 03/05/2022 CBC and CMP are normal.  Uric acid is in desirable range.  Okay to refill Allopurinol?

## 2022-04-04 ENCOUNTER — Other Ambulatory Visit: Payer: Self-pay | Admitting: Family Medicine

## 2022-04-05 ENCOUNTER — Encounter: Payer: Self-pay | Admitting: Family Medicine

## 2022-04-05 ENCOUNTER — Ambulatory Visit (INDEPENDENT_AMBULATORY_CARE_PROVIDER_SITE_OTHER): Payer: Medicare Other | Admitting: Family Medicine

## 2022-04-05 VITALS — BP 138/82 | HR 67 | Ht 59.0 in | Wt 142.0 lb

## 2022-04-05 DIAGNOSIS — E7800 Pure hypercholesterolemia, unspecified: Secondary | ICD-10-CM

## 2022-04-05 DIAGNOSIS — M8580 Other specified disorders of bone density and structure, unspecified site: Secondary | ICD-10-CM

## 2022-04-05 DIAGNOSIS — I10 Essential (primary) hypertension: Secondary | ICD-10-CM | POA: Diagnosis not present

## 2022-04-05 DIAGNOSIS — R7303 Prediabetes: Secondary | ICD-10-CM | POA: Diagnosis not present

## 2022-04-05 DIAGNOSIS — D126 Benign neoplasm of colon, unspecified: Secondary | ICD-10-CM

## 2022-04-05 DIAGNOSIS — Z1211 Encounter for screening for malignant neoplasm of colon: Secondary | ICD-10-CM | POA: Diagnosis not present

## 2022-04-05 LAB — LIPID PANEL
Cholesterol: 184 mg/dL (ref 0–200)
HDL: 57.2 mg/dL (ref >39.00)
NonHDL: 126.67
Total CHOL/HDL Ratio: 3
Triglycerides: 225 mg/dL — ABNORMAL HIGH (ref 0.0–149.0)
VLDL: 45 mg/dL — ABNORMAL HIGH (ref 0.0–40.0)

## 2022-04-05 LAB — LDL CHOLESTEROL, DIRECT: Direct LDL: 94 mg/dL

## 2022-04-05 LAB — TSH: TSH: 3.88 u[IU]/mL (ref 0.35–5.50)

## 2022-04-05 LAB — HEMOGLOBIN A1C: Hgb A1c MFr Bld: 6.3 % (ref 4.6–6.5)

## 2022-04-05 MED ORDER — ATORVASTATIN CALCIUM 20 MG PO TABS: ORAL_TABLET | ORAL | 3 refills | Status: DC

## 2022-04-05 MED ORDER — LOSARTAN POTASSIUM 100 MG PO TABS: ORAL_TABLET | ORAL | 3 refills | Status: DC

## 2022-04-05 MED ORDER — ATENOLOL 25 MG PO TABS: ORAL_TABLET | ORAL | 3 refills | Status: DC

## 2022-04-05 NOTE — Assessment & Plan Note (Signed)
Unsure if she will consider another colonoscopy  req referral for consult first  She will call for that in the fall

## 2022-04-05 NOTE — Progress Notes (Signed)
Subjective:    Patient ID: Cindy [name], female    DOB: Mar 23, 1946, 76 y.o.   MRN: [id number]  HPI Pt presents for annual f/u of chronic medical problems   Wt Readings from Last 3 Encounters:  04/05/22 142 lb (64.4 kg)  03/05/22 142 lb 3.2 oz (64.5 kg)  12/26/21 139 lb (63 kg)   28.68 kg/m  Having a good summer  [place]   Feeling good except for arthritis (sees rheum)   Stays active  Pain after walking 10 minutes- has to take breaks   Immunization History  Administered Date(s) Administered   Fluad Quad(high Dose 65+) 05/25/2019   Hepatitis B 07/03/1988, 07/30/1988, 01/01/1989   Influenza Split 06/04/2011   Influenza Whole 04/26/2009, 05/26/2009, 05/28/2010, 06/02/2012   Influenza, High Dose Seasonal PF 06/17/2018   Influenza,inj,Quad PF,6+ Mos 06/18/2017   Influenza-Unspecified 06/01/2013, 05/27/2014, 05/26/2018   PFIZER Comirnaty(Gray Top)Covid-19 Tri-Sucrose Vaccine 12/01/2020   PFIZER(Purple Top)SARS-COV-2 Vaccination 09/05/2019, 09/26/2019, 05/30/2020   Pfizer Covid-19 Vaccine Bivalent Booster 79yr & up 05/25/2021   Pneumococcal Conjugate-13 01/12/2015   Pneumococcal Polysaccharide-23 10/24/2010   Td 08/26/2004   Zoster, Live 05/07/2013   Health Maintenance Due  Topic Date Due   FOOT EXAM  Never done   OPHTHALMOLOGY EXAM  Never done   Zoster Vaccines- Shingrix (1 of 2) Never done   DEXA SCAN  Never done   COVID-19 Vaccine (6 - Pfizer risk series) 07/20/2021   HEMOGLOBIN A1C  10/05/2021   INFLUENZA VACCINE  03/26/2022   [facility]   Mammogram  01/2022 Self breast exam : no lumps   Dexa  -osteopenia in the past - declines  Falls:none  Fractures: none  Supplements : vitamin D daily  Exercise : quit the gym    Colon cancer screening  11/18 colonoscopy with 5 y recall for polyps  Due in November Had a complicated test -unsure if she wants to do it  May be interested in visit to discuss     Dermatology  care : has appt in October  Goes every 2 years  Is careful with the sun   Eye exam in February   HTN bp is stable today  No cp or palpitations or headaches or edema  No side effects to medicines  BP Readings from Last 3 Encounters:  04/05/22 138/82  03/05/22 (!) 166/91  09/04/21 (!) 167/80    Pulse Readings from Last 3 Encounters:  04/05/22 67  03/05/22 64  09/04/21 70   Losartan 100 mg daily  Atenolol 25 mg daily   Bp at home is excellent 120s/70s to 80s    Lab Results  Component Value Date   CREATININE 0.95 03/05/2022   BUN 19 03/05/2022   NA 138 03/05/2022   K 4.2 03/05/2022   CL 103 03/05/2022   CO2 27 03/05/2022     H/o gout  Sees Dr DGaren GramsTakes allopurinol  Hyperlipidemia Lab Results  Component Value Date   CHOL 177 04/04/2021   HDL 50.60 04/04/2021   LDLCALC 76 02/07/2014   LDLDIRECT 93.0 04/04/2021   TRIG 264.0 (H) 04/04/2021   CHOLHDL 3 04/04/2021   Atorvastatin 20 mg daily   Prediabetes Lab Results  Component Value Date   HGBA1C 6.4 04/04/2021     Patient Active Problem List   Diagnosis Date Noted   Mitral valve insufficiency 12/26/2021   Postmenopausal status 12/26/2021   DM (diabetes mellitus) ([place] 12/26/2021   Gout 02/02/2019  Colon cancer screening 03/11/2017   Routine general medical examination at a health care facility 02/28/2015   Encounter for Medicare annual wellness exam 02/03/2013   Osteopenia 02/03/2012   Prediabetes 10/10/2010   COLONIC POLYPS 03/05/2010   Hyperlipidemia 07/27/2009   Essential hypertension 07/27/2009   BASAL CELL CARCINOMA, HX OF 07/27/2009   Past Medical History:  Diagnosis Date   Benign neoplasm of colon    Gout    MVP (mitral valve prolapse)    very mild   Other abnormal glucose    Other and unspecified hyperlipidemia    Personal history of other malignant neoplasm of skin    Unspecified essential hypertension    Past Surgical History:  Procedure Laterality Date   CARDIAC  CATHETERIZATION  1995   normal (after abnormal stress test)   COLONOSCOPY  07/01/03   polyps   MOHS SURGERY  11/09   for basal cell lesion   Social History   Tobacco Use   Smoking status: Never    Passive exposure: Past   Smokeless tobacco: Never  Vaping Use   Vaping Use: Never used  Substance Use Topics   Alcohol use: Yes    Alcohol/week: 0.0 standard drinks of alcohol    Comment: occasional   Drug use: No   Family History  Problem Relation Age of Onset   Macular degeneration Mother    Sudden death Father 34   Heart disease Brother    Gout Brother    Arthritis Brother    Proofreader cancer Neg Hx    Esophageal cancer Neg Hx    Rectal cancer Neg Hx    Stomach cancer Neg Hx    Allergies  Allergen Reactions   Ampicillin     REACTION: rash   Calcium     REACTION: GI intolerance   Cephalexin     REACTION: rash   Colchicine     Diarrhea   Current Outpatient Medications on File Prior to Visit  Medication Sig Dispense Refill   allopurinol (ZYLOPRIM) 100 MG tablet TAKE 2 TABLETS(200 MG) BY MOUTH DAILY 180 tablet 0   Cholecalciferol (VITAMIN D-3) 1000 units CAPS Take 1 capsule by mouth daily.     colchicine 0.6 MG tablet Take 1 tablet (0.6 mg total) by mouth daily as needed. 90 tablet 0   Loperamide HCl (IMODIUM A-D PO) Take by mouth as needed.     triamcinolone cream (KENALOG) 0.1 % Apply 1 application topically 2 (two) times daily as needed.  2   No current facility-administered medications on file prior to visit.    Review of Systems  Constitutional:  Negative for activity change, appetite change, fatigue, fever and unexpected weight change.  HENT:  Negative for congestion, ear pain, rhinorrhea, sinus pressure and sore throat.   Eyes:  Negative for pain, redness and visual disturbance.  Respiratory:  Negative for cough, shortness of breath and wheezing.   Cardiovascular:  Negative for chest pain and palpitations.  Gastrointestinal:  Negative for  abdominal pain, blood in stool, constipation and diarrhea.  Endocrine: Negative for polydipsia and polyuria.  Genitourinary:  Negative for dysuria, frequency and urgency.  Musculoskeletal:  Positive for arthralgias. Negative for back pain and myalgias.  Skin:  Negative for pallor and rash.  Allergic/Immunologic: Negative for environmental allergies.  Neurological:  Negative for dizziness, syncope and headaches.  Hematological:  Negative for adenopathy. Does not bruise/bleed easily.  Psychiatric/Behavioral:  Negative for decreased concentration and dysphoric mood. The patient is not  nervous/anxious.        Objective:   Physical Exam Constitutional:      General: She is not in acute distress.    Appearance: Normal appearance. She is well-developed and normal weight. She is not ill-appearing or diaphoretic.  HENT:     Head: Normocephalic and atraumatic.     Right Ear: Tympanic membrane, ear canal and external ear normal.     Left Ear: Tympanic membrane, ear canal and external ear normal.     Nose: Nose normal. No congestion.     Mouth/Throat:     Mouth: Mucous membranes are moist.     Pharynx: Oropharynx is clear. No posterior oropharyngeal erythema.  Eyes:     General: No scleral icterus.    Extraocular Movements: Extraocular movements intact.     Conjunctiva/sclera: Conjunctivae normal.     Pupils: Pupils are equal, round, and reactive to light.  Neck:     Thyroid: No thyromegaly.     Vascular: No carotid bruit or JVD.  Cardiovascular:     Rate and Rhythm: Normal rate and regular rhythm.     Pulses: Normal pulses.     Heart sounds: Normal heart sounds.     No gallop.  Pulmonary:     Effort: Pulmonary effort is normal. No respiratory distress.     Breath sounds: Normal breath sounds. No wheezing.     Comments: Good air exch Chest:     Chest wall: No tenderness.  Abdominal:     General: Bowel sounds are normal. There is no distension or abdominal bruit.     Palpations:  Abdomen is soft. There is no mass.     Tenderness: There is no abdominal tenderness.     Hernia: No hernia is present.  Genitourinary:    Comments: Breast exam: No mass, nodules, thickening, tenderness, bulging, retraction, inflamation, nipple discharge or skin changes noted.  No axillary or clavicular LA.     Musculoskeletal:        General: No tenderness. Normal range of motion.     Cervical back: Normal range of motion and neck supple. No rigidity. No muscular tenderness.     Right lower leg: No edema.     Left lower leg: No edema.     Comments: No kyphosis   Lymphadenopathy:     Cervical: No cervical adenopathy.  Skin:    General: Skin is warm and dry.     Coloration: Skin is not pale.     Findings: No erythema or rash.     Comments: Fair  Few lentigines scattered  Neurological:     Mental Status: She is alert. Mental status is at baseline.     Cranial Nerves: No cranial nerve deficit.     Motor: No abnormal muscle tone.     Coordination: Coordination normal.     Gait: Gait normal.     Deep Tendon Reflexes: Reflexes are normal and symmetric. Reflexes normal.  Psychiatric:        Mood and Affect: Mood normal.        Cognition and Memory: Cognition and memory normal.           Assessment & Plan:   Problem List Items Addressed This Visit       Cardiovascular and Mediastinum   Essential hypertension - Primary    bp in fair control at this time  BP Readings from Last 1 Encounters:  04/05/22 138/82  No changes needed Most recent labs reviewed  Disc lifstyle  change with low sodium diet and exercise  Labs reviewed (from rheum)  Labs ordered / lipid and tsh      Relevant Medications   losartan (COZAAR) 100 MG tablet   atenolol (TENORMIN) 25 MG tablet   atorvastatin (LIPITOR) 20 MG tablet   Other Relevant Orders   TSH   Lipid panel     Digestive   COLONIC POLYPS    Pt is due for colonoscopy recall November  She wants to discuss with GI before scheduling since  her last colonoscopy was challenging   She will call closer to the date for a referral         Musculoskeletal and Integument   Osteopenia    Declines another dexa  No falls or fx Taking vit D Enc her to start back with weight bearing exercise         Other   Colon cancer screening    Unsure if she will consider another colonoscopy  req referral for consult first  She will call for that in the fall      Hyperlipidemia    Disc goals for lipids and reasons to control them Rev last labs with pt Rev low sat fat diet in detail  Labs drawn Good diet  Continues atorvastatin 20 mg daily       Relevant Medications   losartan (COZAAR) 100 MG tablet   atenolol (TENORMIN) 25 MG tablet   atorvastatin (LIPITOR) 20 MG tablet   Other Relevant Orders   Lipid panel   Prediabetes    a1c drawn disc imp of low glycemic diet and wt loss to prevent DM2       Relevant Orders   Hemoglobin A1c

## 2022-04-05 NOTE — Assessment & Plan Note (Signed)
Pt is due for colonoscopy recall November  She wants to discuss with GI before scheduling since her last colonoscopy was challenging   She will call closer to the date for a referral

## 2022-04-05 NOTE — Assessment & Plan Note (Signed)
Disc goals for lipids and reasons to control them Rev last labs with pt Rev low sat fat diet in detail  Labs drawn Good diet  Continues atorvastatin 20 mg daily

## 2022-04-05 NOTE — Assessment & Plan Note (Signed)
a1c drawn disc imp of low glycemic diet and wt loss to prevent DM2

## 2022-04-05 NOTE — Assessment & Plan Note (Signed)
bp in fair control at this time  BP Readings from Last 1 Encounters:  04/05/22 138/82   No changes needed Most recent labs reviewed  Disc lifstyle change with low sodium diet and exercise  Labs reviewed (from rheum)  Labs ordered / lipid and tsh

## 2022-04-05 NOTE — Assessment & Plan Note (Signed)
Declines another dexa  No falls or fx Taking vit D Enc her to start back with weight bearing exercise

## 2022-04-05 NOTE — Patient Instructions (Addendum)
Think about the recumbent exercise bike ! - at the gym or you can get them   Stay active  Chair yoga is very popular   Call us after jan 1 to do referral to GI to discuss options for colon cancer screening (a consultation)   Labs today

## 2022-05-20 DIAGNOSIS — Z23 Encounter for immunization: Secondary | ICD-10-CM | POA: Diagnosis not present

## 2022-06-02 DIAGNOSIS — Z23 Encounter for immunization: Secondary | ICD-10-CM | POA: Diagnosis not present

## 2022-06-05 ENCOUNTER — Other Ambulatory Visit: Payer: Self-pay | Admitting: Physician Assistant

## 2022-06-05 NOTE — Telephone Encounter (Signed)
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  Uric acid is in desirable range.  Okay to refill Allopurinol?

## 2022-07-17 ENCOUNTER — Encounter: Payer: Self-pay | Admitting: Gastroenterology

## 2022-08-07 DIAGNOSIS — L821 Other seborrheic keratosis: Secondary | ICD-10-CM | POA: Diagnosis not present

## 2022-08-07 DIAGNOSIS — D225 Melanocytic nevi of trunk: Secondary | ICD-10-CM | POA: Diagnosis not present

## 2022-08-07 DIAGNOSIS — L814 Other melanin hyperpigmentation: Secondary | ICD-10-CM | POA: Diagnosis not present

## 2022-08-07 DIAGNOSIS — L853 Xerosis cutis: Secondary | ICD-10-CM | POA: Diagnosis not present

## 2022-08-27 NOTE — Progress Notes (Signed)
Office Visit Note  Patient: Cindy [name]             Date of Birth: 12/09/1945           MRN: [id number]             PCP: Abner Greenspan, MD Referring: Tower, Wynelle Fanny, MD Visit Date: 09/06/2022 Occupation: '@GUAROCC'$ @  Subjective:  Right hip pain  History of Present Illness: Cindy [name] is a 77 y.o. female history of gout and osteoarthritis.  She states she has been taking allopurinol 100 mg tablet, 2 tablets daily without interruption.  She has not had a gout flare.  She did not have to take colchicine since the last visit.  She states during the Christmas time she was going up and down her attic stairs to get decorations down.  She started having pain and discomfort in her right hip which gradually got worse.  She still having difficulty getting in and out of her SUV.  She has difficulty raising her leg.  She is describes tenderness over the lateral aspect of her hip.  The other joints are painful.  There is no history of injury.    Activities of Daily Living:  Patient reports morning stiffness for 10-15 minutes.   Patient Denies nocturnal pain.  Difficulty dressing/grooming: Denies Difficulty climbing stairs: Reports Difficulty getting out of chair: Denies Difficulty using hands for taps, buttons, cutlery, and/or writing: Denies  Review of Systems  Constitutional:  Negative for fatigue.  HENT:  Negative for mouth sores and mouth dryness.   Eyes:  Negative for dryness.  Respiratory:  Negative for shortness of breath.   Cardiovascular:  Negative for chest pain and palpitations.  Gastrointestinal:  Negative for blood in stool, constipation and diarrhea.  Endocrine: Negative for increased urination.  Genitourinary:  Negative for involuntary urination.  Musculoskeletal:  Positive for myalgias, morning stiffness, muscle tenderness and myalgias. Negative for joint pain, gait problem, joint pain, joint swelling and muscle weakness.  Skin:  Negative for color change, rash, hair  loss and sensitivity to sunlight.  Allergic/Immunologic: Negative for susceptible to infections.  Neurological:  Negative for dizziness and headaches.  Hematological:  Negative for swollen glands.  Psychiatric/Behavioral:  Negative for depressed mood and sleep disturbance. The patient is not nervous/anxious.     PMFS History:  Patient Active Problem List   Diagnosis Date Noted   Mitral valve insufficiency 12/26/2021   Postmenopausal status 12/26/2021   DM (diabetes mellitus) ([place]) 12/26/2021   Gout 02/02/2019   Colon cancer screening 03/11/2017   Routine general medical examination at a health care facility 02/28/2015   Encounter for Medicare annual wellness exam 02/03/2013   Osteopenia 02/03/2012   Prediabetes 10/10/2010   COLONIC POLYPS 03/05/2010   Hyperlipidemia 07/27/2009   Essential hypertension 07/27/2009   BASAL CELL CARCINOMA, HX OF 07/27/2009    Past Medical History:  Diagnosis Date   Benign neoplasm of colon    Gout    MVP (mitral valve prolapse)    very mild   Other abnormal glucose    Other and unspecified hyperlipidemia    Personal history of other malignant neoplasm of skin    Unspecified essential hypertension     Family History  Problem Relation Age of Onset   Macular degeneration Mother    Sudden death Father 40   Heart disease Brother    Gout Brother    Arthritis Brother    Healthy Daughter    Colon cancer Neg Hx  Esophageal cancer Neg Hx    Rectal cancer Neg Hx    Stomach cancer Neg Hx    Past Surgical History:  Procedure Laterality Date   CARDIAC CATHETERIZATION  1995   normal (after abnormal stress test)   COLONOSCOPY  07/01/03   polyps   MOHS SURGERY  11/09   for basal cell lesion   Social History   Social History Narrative   Retired-med Designer, multimedia   Husband had advanced parkinson's and dementia, passed away in 11/22/2012.   No regular exercise   Immunization History  Administered Date(s) Administered   Fluad Quad(high Dose 65+) 05/25/2019    Hepatitis B 07/03/1988, 07/30/1988, 01/01/1989   Influenza Split 06/04/2011   Influenza Whole 04/26/2009, 05/26/2009, 05/28/2010, 06/02/2012   Influenza, High Dose Seasonal PF 06/17/2018   Influenza,inj,Quad PF,6+ Mos 06/18/2017   Influenza-Unspecified 06/01/2013, 05/27/2014, 05/26/2018   PFIZER Comirnaty(Gray Top)Covid-19 Tri-Sucrose Vaccine 12/01/2020   PFIZER(Purple Top)SARS-COV-2 Vaccination 09/05/2019, 09/26/2019, 05/30/2020   Pfizer Covid-19 Vaccine Bivalent Booster 43yr & up 05/25/2021   Pneumococcal Conjugate-13 01/12/2015   Pneumococcal Polysaccharide-23 10/24/2010   Td 08/26/2004   Zoster, Live 05/07/2013   Zoster, Unspecified 09/20/2021, 12/04/2021     Objective: Vital Signs: BP (!) 193/99 (BP Location: Left Arm, Patient Position: Sitting, Cuff Size: Normal)   Pulse 66   Resp 15   Ht 5' (1.524 m)   Wt 141 lb 3.2 oz (64 kg)   BMI 27.58 kg/m    Physical Exam Vitals and nursing note reviewed.  Constitutional:      Appearance: She is well-developed.  HENT:     Head: Normocephalic and atraumatic.  Eyes:     Conjunctiva/sclera: Conjunctivae normal.  Cardiovascular:     Rate and Rhythm: Normal rate and regular rhythm.     Heart sounds: Normal heart sounds.  Pulmonary:     Effort: Pulmonary effort is normal.     Breath sounds: Normal breath sounds.  Abdominal:     General: Bowel sounds are normal.     Palpations: Abdomen is soft.  Musculoskeletal:     Cervical back: Normal range of motion.  Lymphadenopathy:     Cervical: No cervical adenopathy.  Skin:    General: Skin is warm and dry.     Capillary Refill: Capillary refill takes less than 2 seconds.  Neurological:     Mental Status: She is alert and oriented to person, place, and time.  Psychiatric:        Behavior: Behavior normal.      Musculoskeletal Exam: Cervical spine was in good range of motion.  She had thoracic kyphosis.  She had discomfort with range of motion of her lumbar spine.  Shoulder  joints, elbow joints, wrist joints with good range of motion.  She had mild PIP and DIP thickening with no synovitis.  She had limited range of motion of bilateral hip joints with tenderness over right trochanteric region.  Knee joints with good range of motion.  There was no tenderness over ankles or MTPs.  CDAI Exam: CDAI Score: -- Patient Global: --; Provider Global: -- Swollen: --; Tender: -- Joint Exam 09/06/2022   No joint exam has been documented for this visit   There is currently no information documented on the homunculus. Go to the Rheumatology activity and complete the homunculus joint exam.  Investigation: No additional findings.  Imaging: No results found.  Recent Labs: Lab Results  Component Value Date   WBC 7.6 03/05/2022   HGB 13.5 03/05/2022   PLT 280  03/05/2022   NA 138 03/05/2022   K 4.2 03/05/2022   CL 103 03/05/2022   CO2 27 03/05/2022   GLUCOSE 117 (H) 03/05/2022   BUN 19 03/05/2022   CREATININE 0.95 03/05/2022   BILITOT 0.6 03/05/2022   ALKPHOS 70 03/29/2019   AST 22 03/05/2022   ALT 20 03/05/2022   PROT 7.1 03/05/2022   ALBUMIN 4.4 03/29/2019   CALCIUM 9.8 03/05/2022   GFRAA 76 08/30/2020    Speciality Comments: No specialty comments available.  Procedures:  No procedures performed Allergies: Ampicillin, Calcium, Cephalexin, and Colchicine   Assessment / Plan:     Visit Diagnoses: Chronic idiopathic gout involving toe without tophus, unspecified laterality - uric acid: 3.6 on 03/05/2022.  She continues to take allopurinol 200 mg p.o. daily.  She denies having any gout flares since the last visit.  She has colchicine but she did not have to take the medication.  I will obtain uric acid level today.  Plan: Uric acid  Hyperuricemia  Medication monitoring encounter -she is on allopurinol 100 mg 2 tablets by mouth daily, colchicine 0.'6mg'$  1 tablet by mouth daily as needed. - Plan: CBC with Differential/Platelet, COMPLETE METABOLIC PANEL WITH GFR  today.  Primary osteoarthritis of both hands-she has mild osteoarthritis in her hands with DIP and PIP thickening.  Joint protection muscle strengthening was discussed.  Pain in right hip -she has been having right hip pain for the last few weeks.  She states she went up and down the attic the stairs during the Christmas time and that flared her right hip pain.  She points to the area of discomfort over the right trochanteric region.  She states she is having difficulty getting in and out of the car.  Plan: XR HIP UNILAT W OR W/O PELVIS 2-3 VIEWS RIGHT.  X-rays showed mild spurring and subchondral sclerosis consistent with early degenerative changes.  X-ray findings were reviewed with the patient.  I will refer her to physical therapy.  Trochanteric bursitis of both hips-she had tenderness over bilateral trochanteric bursa more so on the right side.  She had limited range of motion of bilateral hips.  She had symptoms of right trochanteric bursitis today.  I would avoid cortisone injection due to elevated blood pressure.  If she had an inadequate response to physical therapy then we may consider cortisone injection in the future.  Primary osteoarthritis of both fee proper fitting shoes were advised.  t  Chronic midline low back pain without sciatica-she has intermittent lower back pain.  Core strengthening exercises were discussed.  Postural kyphosis of thoracic region-back strengthening exercises were discussed.  Essential hypertension-blood pressure was elevated at 193/99 today.  Patient states her blood pressure has been running normal at home.  She was advised to monitor blood pressure closely and follow-up with her PCP.  Prediabetes  History of hyperlipidemia  Hx of colonic polyps  History of basal cell carcinoma  History of alcohol use  Orders: Orders Placed This Encounter  Procedures   XR HIP UNILAT W OR W/O PELVIS 2-3 VIEWS RIGHT   CBC with Differential/Platelet   COMPLETE  METABOLIC PANEL WITH GFR   Uric acid   No orders of the defined types were placed in this encounter.    Follow-Up Instructions: Return in about 6 months (around 03/07/2023) for Gout.   Bo Merino, MD  Note - This record has been created using Editor, commissioning.  Chart creation errors have been sought, but may not always  have been  located. Such creation errors do not reflect on  the standard of medical care.

## 2022-09-06 ENCOUNTER — Other Ambulatory Visit: Payer: Self-pay | Admitting: Rheumatology

## 2022-09-06 ENCOUNTER — Ambulatory Visit (INDEPENDENT_AMBULATORY_CARE_PROVIDER_SITE_OTHER): Payer: Medicare Other

## 2022-09-06 ENCOUNTER — Ambulatory Visit: Payer: Medicare Other | Attending: Rheumatology | Admitting: Rheumatology

## 2022-09-06 ENCOUNTER — Encounter: Payer: Self-pay | Admitting: Rheumatology

## 2022-09-06 VITALS — BP 179/93 | HR 69 | Resp 15 | Ht 60.0 in | Wt 141.2 lb

## 2022-09-06 DIAGNOSIS — R7303 Prediabetes: Secondary | ICD-10-CM | POA: Insufficient documentation

## 2022-09-06 DIAGNOSIS — M4004 Postural kyphosis, thoracic region: Secondary | ICD-10-CM | POA: Diagnosis not present

## 2022-09-06 DIAGNOSIS — M1A0790 Idiopathic chronic gout, unspecified ankle and foot, without tophus (tophi): Secondary | ICD-10-CM | POA: Diagnosis not present

## 2022-09-06 DIAGNOSIS — Z87898 Personal history of other specified conditions: Secondary | ICD-10-CM | POA: Insufficient documentation

## 2022-09-06 DIAGNOSIS — Z8639 Personal history of other endocrine, nutritional and metabolic disease: Secondary | ICD-10-CM | POA: Insufficient documentation

## 2022-09-06 DIAGNOSIS — M7061 Trochanteric bursitis, right hip: Secondary | ICD-10-CM | POA: Diagnosis not present

## 2022-09-06 DIAGNOSIS — G8929 Other chronic pain: Secondary | ICD-10-CM | POA: Diagnosis not present

## 2022-09-06 DIAGNOSIS — M7062 Trochanteric bursitis, left hip: Secondary | ICD-10-CM | POA: Diagnosis not present

## 2022-09-06 DIAGNOSIS — M19072 Primary osteoarthritis, left ankle and foot: Secondary | ICD-10-CM | POA: Insufficient documentation

## 2022-09-06 DIAGNOSIS — M545 Low back pain, unspecified: Secondary | ICD-10-CM | POA: Diagnosis not present

## 2022-09-06 DIAGNOSIS — Z85828 Personal history of other malignant neoplasm of skin: Secondary | ICD-10-CM | POA: Insufficient documentation

## 2022-09-06 DIAGNOSIS — Z86010 Personal history of colonic polyps: Secondary | ICD-10-CM | POA: Diagnosis not present

## 2022-09-06 DIAGNOSIS — M25551 Pain in right hip: Secondary | ICD-10-CM

## 2022-09-06 DIAGNOSIS — Z5181 Encounter for therapeutic drug level monitoring: Secondary | ICD-10-CM | POA: Diagnosis not present

## 2022-09-06 DIAGNOSIS — I10 Essential (primary) hypertension: Secondary | ICD-10-CM

## 2022-09-06 DIAGNOSIS — E790 Hyperuricemia without signs of inflammatory arthritis and tophaceous disease: Secondary | ICD-10-CM | POA: Insufficient documentation

## 2022-09-06 DIAGNOSIS — M19041 Primary osteoarthritis, right hand: Secondary | ICD-10-CM | POA: Insufficient documentation

## 2022-09-06 DIAGNOSIS — M19071 Primary osteoarthritis, right ankle and foot: Secondary | ICD-10-CM | POA: Insufficient documentation

## 2022-09-06 DIAGNOSIS — M19042 Primary osteoarthritis, left hand: Secondary | ICD-10-CM | POA: Diagnosis not present

## 2022-09-06 MED ORDER — ALLOPURINOL 100 MG PO TABS: ORAL_TABLET | ORAL | 0 refills | Status: DC

## 2022-09-06 MED ORDER — COLCHICINE 0.6 MG PO TABS: 0.6000 mg | ORAL_TABLET | Freq: Every day | ORAL | 0 refills | Status: DC | PRN

## 2022-09-06 NOTE — Telephone Encounter (Signed)
Patient requested prescription refills of Allopurinol and Colchicine to be sent to [facility] in [place].

## 2022-09-06 NOTE — Telephone Encounter (Signed)
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  Uric acid is in desirable range.   Patient updated labs in office today.  Okay to refill Allopurinol and Colchicine?

## 2022-09-07 LAB — URIC ACID: Uric Acid, Serum: 2.6 mg/dL (ref 2.5–7.0)

## 2022-09-07 LAB — CBC WITH DIFFERENTIAL/PLATELET
Absolute Monocytes: 826 cells/uL (ref 200–950)
Basophils Absolute: 73 cells/uL (ref 0–200)
Basophils Relative: 0.9 %
Eosinophils Absolute: 57 cells/uL (ref 15–500)
Eosinophils Relative: 0.7 %
HCT: 39.7 % (ref 35.0–45.0)
Hemoglobin: 14 g/dL (ref 11.7–15.5)
Lymphs Abs: 2130 cells/uL (ref 850–3900)
MCH: 33.1 pg — ABNORMAL HIGH (ref 27.0–33.0)
MCHC: 35.3 g/dL (ref 32.0–36.0)
MCV: 93.9 fL (ref 80.0–100.0)
MPV: 10.6 fL (ref 7.5–12.5)
Monocytes Relative: 10.2 %
Neutro Abs: 5014 cells/uL (ref 1500–7800)
Neutrophils Relative %: 61.9 %
Platelets: 294 10*3/uL (ref 140–400)
RBC: 4.23 10*6/uL (ref 3.80–5.10)
RDW: 13.3 % (ref 11.0–15.0)
Total Lymphocyte: 26.3 %
WBC: 8.1 10*3/uL (ref 3.8–10.8)

## 2022-09-07 LAB — COMPLETE METABOLIC PANEL WITH GFR
AG Ratio: 1.5 (calc) (ref 1.0–2.5)
ALT: 28 U/L (ref 6–29)
AST: 30 U/L (ref 10–35)
Albumin: 4.4 g/dL (ref 3.6–5.1)
Alkaline phosphatase (APISO): 78 U/L (ref 37–153)
BUN: 17 mg/dL (ref 7–25)
CO2: 26 mmol/L (ref 20–32)
Calcium: 9.6 mg/dL (ref 8.6–10.4)
Chloride: 101 mmol/L (ref 98–110)
Creat: 0.83 mg/dL (ref 0.60–1.00)
Globulin: 3 g/dL (calc) (ref 1.9–3.7)
Glucose, Bld: 105 mg/dL — ABNORMAL HIGH (ref 65–99)
Potassium: 4.2 mmol/L (ref 3.5–5.3)
Sodium: 138 mmol/L (ref 135–146)
Total Bilirubin: 0.6 mg/dL (ref 0.2–1.2)
Total Protein: 7.4 g/dL (ref 6.1–8.1)
eGFR: 73 mL/min/{1.73_m2} (ref >=60)

## 2022-09-09 NOTE — Progress Notes (Signed)
CBC, and CMP are within normal limits. Uric acid is in the desirable range.

## 2022-09-18 ENCOUNTER — Encounter: Payer: Self-pay | Admitting: Family Medicine

## 2022-11-13 DIAGNOSIS — M6281 Muscle weakness (generalized): Secondary | ICD-10-CM | POA: Diagnosis not present

## 2022-11-13 DIAGNOSIS — M7061 Trochanteric bursitis, right hip: Secondary | ICD-10-CM | POA: Diagnosis not present

## 2022-11-13 DIAGNOSIS — M1611 Unilateral primary osteoarthritis, right hip: Secondary | ICD-10-CM | POA: Diagnosis not present

## 2022-11-13 DIAGNOSIS — M25551 Pain in right hip: Secondary | ICD-10-CM | POA: Diagnosis not present

## 2022-11-20 DIAGNOSIS — M25551 Pain in right hip: Secondary | ICD-10-CM | POA: Diagnosis not present

## 2022-11-20 DIAGNOSIS — M1611 Unilateral primary osteoarthritis, right hip: Secondary | ICD-10-CM | POA: Diagnosis not present

## 2022-11-20 DIAGNOSIS — M6281 Muscle weakness (generalized): Secondary | ICD-10-CM | POA: Diagnosis not present

## 2022-11-20 DIAGNOSIS — M7061 Trochanteric bursitis, right hip: Secondary | ICD-10-CM | POA: Diagnosis not present

## 2022-11-22 DIAGNOSIS — M6281 Muscle weakness (generalized): Secondary | ICD-10-CM | POA: Diagnosis not present

## 2022-11-22 DIAGNOSIS — M1611 Unilateral primary osteoarthritis, right hip: Secondary | ICD-10-CM | POA: Diagnosis not present

## 2022-11-22 DIAGNOSIS — M25551 Pain in right hip: Secondary | ICD-10-CM | POA: Diagnosis not present

## 2022-11-22 DIAGNOSIS — M7061 Trochanteric bursitis, right hip: Secondary | ICD-10-CM | POA: Diagnosis not present

## 2022-11-25 DIAGNOSIS — M25551 Pain in right hip: Secondary | ICD-10-CM | POA: Diagnosis not present

## 2022-11-25 DIAGNOSIS — M6281 Muscle weakness (generalized): Secondary | ICD-10-CM | POA: Diagnosis not present

## 2022-11-25 DIAGNOSIS — M1611 Unilateral primary osteoarthritis, right hip: Secondary | ICD-10-CM | POA: Diagnosis not present

## 2022-11-25 DIAGNOSIS — M7061 Trochanteric bursitis, right hip: Secondary | ICD-10-CM | POA: Diagnosis not present

## 2022-11-28 DIAGNOSIS — M1611 Unilateral primary osteoarthritis, right hip: Secondary | ICD-10-CM | POA: Diagnosis not present

## 2022-11-28 DIAGNOSIS — M7061 Trochanteric bursitis, right hip: Secondary | ICD-10-CM | POA: Diagnosis not present

## 2022-11-28 DIAGNOSIS — M25551 Pain in right hip: Secondary | ICD-10-CM | POA: Diagnosis not present

## 2022-11-28 DIAGNOSIS — M6281 Muscle weakness (generalized): Secondary | ICD-10-CM | POA: Diagnosis not present

## 2022-12-02 ENCOUNTER — Other Ambulatory Visit: Payer: Self-pay | Admitting: Physician Assistant

## 2022-12-02 NOTE — Telephone Encounter (Signed)
Last Fill: 09/06/2022  Labs: 09/06/2022 CBC, and CMP are within normal limits. Uric acid is in the desirable range.   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 daily.   Okay to refill Allopurinol?

## 2022-12-04 DIAGNOSIS — M6281 Muscle weakness (generalized): Secondary | ICD-10-CM | POA: Diagnosis not present

## 2022-12-04 DIAGNOSIS — M25551 Pain in right hip: Secondary | ICD-10-CM | POA: Diagnosis not present

## 2022-12-04 DIAGNOSIS — M7061 Trochanteric bursitis, right hip: Secondary | ICD-10-CM | POA: Diagnosis not present

## 2022-12-04 DIAGNOSIS — M1611 Unilateral primary osteoarthritis, right hip: Secondary | ICD-10-CM | POA: Diagnosis not present

## 2022-12-10 DIAGNOSIS — M6281 Muscle weakness (generalized): Secondary | ICD-10-CM | POA: Diagnosis not present

## 2022-12-10 DIAGNOSIS — M7061 Trochanteric bursitis, right hip: Secondary | ICD-10-CM | POA: Diagnosis not present

## 2022-12-10 DIAGNOSIS — M1611 Unilateral primary osteoarthritis, right hip: Secondary | ICD-10-CM | POA: Diagnosis not present

## 2022-12-10 DIAGNOSIS — M25551 Pain in right hip: Secondary | ICD-10-CM | POA: Diagnosis not present

## 2022-12-13 DIAGNOSIS — M6281 Muscle weakness (generalized): Secondary | ICD-10-CM | POA: Diagnosis not present

## 2022-12-13 DIAGNOSIS — M7061 Trochanteric bursitis, right hip: Secondary | ICD-10-CM | POA: Diagnosis not present

## 2022-12-13 DIAGNOSIS — M1611 Unilateral primary osteoarthritis, right hip: Secondary | ICD-10-CM | POA: Diagnosis not present

## 2022-12-13 DIAGNOSIS — M25551 Pain in right hip: Secondary | ICD-10-CM | POA: Diagnosis not present

## 2022-12-24 DIAGNOSIS — M1611 Unilateral primary osteoarthritis, right hip: Secondary | ICD-10-CM | POA: Diagnosis not present

## 2022-12-24 DIAGNOSIS — M25551 Pain in right hip: Secondary | ICD-10-CM | POA: Diagnosis not present

## 2022-12-24 DIAGNOSIS — M7061 Trochanteric bursitis, right hip: Secondary | ICD-10-CM | POA: Diagnosis not present

## 2022-12-24 DIAGNOSIS — M6281 Muscle weakness (generalized): Secondary | ICD-10-CM | POA: Diagnosis not present

## 2022-12-27 DIAGNOSIS — M1611 Unilateral primary osteoarthritis, right hip: Secondary | ICD-10-CM | POA: Diagnosis not present

## 2022-12-27 DIAGNOSIS — M6281 Muscle weakness (generalized): Secondary | ICD-10-CM | POA: Diagnosis not present

## 2022-12-27 DIAGNOSIS — M7061 Trochanteric bursitis, right hip: Secondary | ICD-10-CM | POA: Diagnosis not present

## 2022-12-27 DIAGNOSIS — M25551 Pain in right hip: Secondary | ICD-10-CM | POA: Diagnosis not present

## 2023-01-01 ENCOUNTER — Ambulatory Visit (INDEPENDENT_AMBULATORY_CARE_PROVIDER_SITE_OTHER): Payer: Medicare Other

## 2023-01-01 VITALS — Ht 60.0 in | Wt 136.0 lb

## 2023-01-01 DIAGNOSIS — Z0000 Encounter for general adult medical examination without abnormal findings: Secondary | ICD-10-CM | POA: Diagnosis not present

## 2023-01-01 NOTE — Progress Notes (Signed)
I connected with  Cindy [name] on 01/01/23 by a audio enabled telemedicine application and verified that I am speaking with the correct person using two identifiers.  Patient Location: Home  Provider Location: Home Office  I discussed the limitations of evaluation and management by telemedicine. The patient expressed understanding and agreed to proceed.  Subjective:   Cindy [name] is a 77 y.o. female who presents for Medicare Annual (Subsequent) preventive examination.  Review of Systems      Cardiac Risk Factors include: advanced age (>29men, >50 women);hypertension;sedentary lifestyle     Objective:    Today's Vitals   01/01/23 0828  Weight: 136 lb (61.7 kg)  Height: 5' (1.524 m)   Body mass index is 26.56 kg/m.     01/01/2023    8:38 AM 12/26/2021    8:57 AM 03/31/2020    9:50 AM 03/16/2018   11:18 AM 07/04/2017    8:55 AM 03/11/2017    9:44 AM 03/07/2016    2:01 PM  Advanced Directives  Does Patient Have a Medical Advance Directive? Yes Yes Yes Yes Yes Yes Yes  Type of Estate agent of [place];Living will Healthcare Power of [place];Living will Healthcare Power of [place];Living will Healthcare Power of [place];Living will Healthcare Power of [place];Living will Healthcare Power of [place];Living will Healthcare Power of [place];Living will  Does patient want to make changes to medical advance directive?       No - Patient declined  Copy of Healthcare Power of Attorney in Chart? No - copy requested Yes - validated most recent copy scanned in chart (See row information) No - copy requested No - copy requested  No - copy requested No - copy requested    Current Medications (verified) Outpatient Encounter Medications as of 01/01/2023  Medication Sig   allopurinol (ZYLOPRIM) 100 MG tablet TAKE 2 TABLETS(200 MG) BY MOUTH DAILY   atenolol (TENORMIN) 25 MG tablet TAKE 1 TABLET(25 MG) BY MOUTH DAILY   atorvastatin (LIPITOR) 20 MG tablet TAKE 1 TABLET(20  MG) BY MOUTH DAILY   Cholecalciferol (VITAMIN D-3) 1000 units CAPS Take 1 capsule by mouth daily.   Loperamide HCl (IMODIUM A-D PO) Take by mouth as needed.   losartan (COZAAR) 100 MG tablet TAKE 1 TABLET(100 MG) BY MOUTH DAILY   triamcinolone cream (KENALOG) 0.1 % Apply 1 application topically 2 (two) times daily as needed.   colchicine 0.6 MG tablet Take 1 tablet (0.6 mg total) by mouth daily as needed. (Patient not taking: Reported on 01/01/2023)   No facility-administered encounter medications on file as of 01/01/2023.    Allergies (verified) Ampicillin, Calcium, Cephalexin, and Colchicine   History: Past Medical History:  Diagnosis Date   Benign neoplasm of colon    Gout    MVP (mitral valve prolapse)    very mild   Other abnormal glucose    Other and unspecified hyperlipidemia    Personal history of other malignant neoplasm of skin    Unspecified essential hypertension    Past Surgical History:  Procedure Laterality Date   CARDIAC CATHETERIZATION  1995   normal (after abnormal stress test)   COLONOSCOPY  07/01/03   polyps   MOHS SURGERY  11/09   for basal cell lesion   Family History  Problem Relation Age of Onset   Macular degeneration Mother    Sudden death Father 9   Heart disease Brother    Gout Brother    Arthritis Brother    Physiological scientist  cancer Neg Hx    Esophageal cancer Neg Hx    Rectal cancer Neg Hx    Stomach cancer Neg Hx    Social History   Socioeconomic History   Marital status: Widowed    Spouse name: Not on file   Number of children: 1   Years of education: Not on file   Highest education level: Not on file  Occupational History   Occupation: retired  Tobacco Use   Smoking status: Never    Passive exposure: Past   Smokeless tobacco: Never  Vaping Use   Vaping Use: Never used  Substance and Sexual Activity   Alcohol use: Yes    Alcohol/week: 0.0 standard drinks of alcohol    Comment: occasional   Drug use: No   Sexual  activity: Never  Other Topics Concern   Not on file  Social History Narrative   Retired-med tech   Husband had advanced parkinson's and dementia, passed away in 01/26/13.   No regular exercise   Social Determinants of Health   Financial Resource Strain: Low Risk  (01/01/2023)   Overall Financial Resource Strain (CARDIA)    Difficulty of Paying Living Expenses: Not hard at all  Food Insecurity: No Food Insecurity (01/01/2023)   Hunger Vital Sign    Worried About Running Out of Food in the Last Year: Never true    Ran Out of Food in the Last Year: Never true  Transportation Needs: No Transportation Needs (01/01/2023)   PRAPARE - Administrator, Civil Service (Medical): No    Lack of Transportation (Non-Medical): No  Physical Activity: Inactive (01/01/2023)   Exercise Vital Sign    Days of Exercise per Week: 0 days    Minutes of Exercise per Session: 0 min  Stress: No Stress Concern Present (01/01/2023)   [employer] of Occupational Health - Occupational Stress Questionnaire    Feeling of Stress : Not at all  Social Connections: Socially Isolated (01/01/2023)   Social Connection and Isolation Panel [NHANES]    Frequency of Communication with Friends and Family: More than three times a week    Frequency of Social Gatherings with Friends and Family: More than three times a week    Attends Religious Services: Never    Database administrator or Organizations: No    Attends Banker Meetings: Never    Marital Status: Widowed    Tobacco Counseling Counseling given: Not Answered   Clinical Intake:  Pre-visit preparation completed: Yes  Pain : No/denies pain     Nutritional Risks: None Diabetes: No  How often do you need to have someone help you when you read instructions, pamphlets, or other written materials from your doctor or pharmacy?: 1 - Never  Diabetic? No per pt  Interpreter Needed?: No  Information entered by :: C.Shavonda Wiedman LPN   Activities of  Daily Living    01/01/2023    8:39 AM  In your present state of health, do you have any difficulty performing the following activities:  Hearing? 0  Vision? 0  Difficulty concentrating or making decisions? 0  Walking or climbing stairs? 0  Dressing or bathing? 0  Doing errands, shopping? 0  Preparing Food and eating ? N  Using the Toilet? N  In the past six months, have you accidently leaked urine? N  Do you have problems with loss of bowel control? N  Managing your Medications? N  Managing your Finances? N  Housekeeping or managing your Housekeeping? N  Patient Care Team: Tower, Audrie Gallus, MD as PCP - General Thereasa Solo as Referring Physician (Optometry) Cherlyn Roberts, MD as Consulting Physician (Dermatology)  Indicate any recent Medical Services you may have received from other than [facility] providers in the past year (date may be approximate).     Assessment:   This is a routine wellness examination for Cindy [name].  Hearing/Vision screen Hearing Screening - Comments:: No aids Vision Screening - Comments:: Glasses - [place]  Dietary issues and exercise activities discussed: Current Exercise Habits: The patient does not participate in regular exercise at present, Exercise limited by: None identified   Goals Addressed             This Visit's Progress    Patient Stated       No new goals.       Depression Screen    01/01/2023    8:37 AM 04/05/2022   10:23 AM 12/26/2021    8:53 AM 04/04/2021    9:35 AM 04/04/2021    9:29 AM 03/31/2020    9:54 AM 03/29/2019   11:22 AM  PHQ 2/9 Scores  PHQ - 2 Score 0 0 0 0 0 0 0  PHQ- 9 Score    0  0     Fall Risk    01/01/2023    8:39 AM 04/05/2022   10:23 AM 12/26/2021    8:58 AM 04/04/2021    9:29 AM 03/31/2020    9:51 AM  Fall Risk   Falls in the past year? 0 0 0 0 0  Number falls in past yr: 0  0 0 0  Injury with Fall? 0  0 0 0  Risk for fall due to : No Fall Risks  No Fall Risks  Medication side effect  Follow up  Falls prevention discussed;Falls evaluation completed;Education provided Falls evaluation completed Falls prevention discussed  Falls evaluation completed    FALL RISK PREVENTION PERTAINING TO THE HOME:  Any stairs in or around the home? Yes  If so, are there any without handrails? No  Home free of loose throw rugs in walkways, pet beds, electrical cords, etc? Yes  Adequate lighting in your home to reduce risk of falls? Yes   ASSISTIVE DEVICES UTILIZED TO PREVENT FALLS:  Life alert? No  Use of a cane, walker or w/c? No  Grab bars in the bathroom? Yes  Shower chair or bench in shower? Yes  Elevated toilet seat or a handicapped toilet? Yes    Cognitive Function:    03/31/2020    9:56 AM 03/16/2018   11:18 AM 03/11/2017    9:46 AM 03/07/2016    2:25 PM  MMSE - Mini Mental State Exam  Orientation to time 5 5 5 5   Orientation to Place 5 5 5 5   Registration 3 3 3 3   Attention/ Calculation 5 0 0 0  Recall 3 3 3 3   Language- name 2 objects  0 0 0  Language- repeat 1 1 1 1   Language- follow 3 step command  3 3 3   Language- read & follow direction  0 0 0  Write a sentence  0 0 0  Copy design  0 0 0  Total score  20 20 20         01/01/2023    8:40 AM 12/26/2021    8:59 AM  6CIT Screen  What Year? 0 points 0 points  What month? 0 points 0 points  What time? 0 points 0 points  Count  back from 20 0 points 0 points  Months in reverse 0 points 0 points  Repeat phrase 0 points 0 points  Total Score 0 points 0 points    Immunizations Immunization History  Administered Date(s) Administered   Fluad Quad(high Dose 65+) 05/25/2019   Hepatitis B 07/03/1988, 07/30/1988, 01/01/1989   Influenza Split 06/04/2011   Influenza Whole 04/26/2009, 05/26/2009, 05/28/2010, 06/02/2012   Influenza, High Dose Seasonal PF 06/17/2018   Influenza,inj,Quad PF,6+ Mos 06/18/2017   Influenza-Unspecified 06/01/2013, 05/27/2014, 05/26/2018   PFIZER Comirnaty(Gray Top)Covid-19 Tri-Sucrose Vaccine 12/01/2020    PFIZER(Purple Top)SARS-COV-2 Vaccination 09/05/2019, 09/26/2019, 05/30/2020   Pfizer Covid-19 Vaccine Bivalent Booster 19yrs & up 05/25/2021   Pneumococcal Conjugate-13 01/12/2015   Pneumococcal Polysaccharide-23 10/24/2010   Td 08/26/2004   Zoster, Live 05/07/2013   Zoster, Unspecified 09/20/2021, 12/04/2021    TDAP status: Due, Education has been provided regarding the importance of this vaccine. Advised may receive this vaccine at local pharmacy or Health Dept. Aware to provide a copy of the vaccination record if obtained from local pharmacy or Health Dept. Verbalized acceptance and understanding.  Flu Vaccine status: Up to date  Pneumococcal vaccine status: Up to date  Covid-19 vaccine status: Completed vaccines  Qualifies for Shingles Vaccine? Yes   Zostavax completed No   S[facility]rix Com[facility]?: [facility]  Screening Tests Health Maintenance  Topic Date Due   FOOT EXAM  Never done   Zoster Vaccines- Shingrix (1 of 2) 08/10/1964   Diabetic kidney evaluation - Urine ACR  04/21/2011   DTaP/Tdap/Td (2 - Tdap) 08/26/2014   COVID-19 Vaccine (6 - 2023-24 season) 04/26/2022   COLONOSCOPY (Pts 45-27yrs Insurance coverage will need to be confirmed)  07/04/2022   HEMOGLOBIN A1C  10/06/2022   OPHTHALMOLOGY EXAM  10/16/2022   DEXA SCAN  04/06/2023 (Originally 08/10/2010)   MAMMOGRAM  01/30/2023   INFLUENZA VACCINE  03/27/2023   Diabetic kidney evaluation - eGFR measurement  09/07/2023   Medicare Annual Wellness (AWV)  01/01/2024   Pneumonia Vaccine 97+ Years old  Completed   Hepatitis C Screening  Completed   HPV VACCINES  Aged Out    Health Maintenance  Health Maintenance Due  Topic Date Due   FOOT EXAM  Never done   Zoster Vaccines- Shingrix (1 of 2) 08/10/1964   Diabetic kidney evaluation - Urine ACR  04/21/2011   DTaP/Tdap/Td (2 - Tdap) 08/26/2014   COVID-19 Vaccine (6 - 2023-24 season) 04/26/2022   COLONOSCOPY (Pts 45-9yrs Insurance coverage will need to be  confirmed)  07/04/2022   HEMOGLOBIN A1C  10/06/2022   OPHTHALMOLOGY EXAM  10/16/2022    Colorectal cancer screening: Type of screening: Colonoscopy. Completed 07/04/17. Repeat every 5 years pt declined.  Mammogram status: Completed 01/29/2022. Repeat every year pt has letter and will call to schedule.  Bone scan - Pt declined  Lung Cancer Screening: (Low Dose CT Chest recommended if Age 40-80 years, 30 pack-year currently smoking OR have quit w/in 15years.) does not qualify.   Lung Cancer Screening Referral: no  Additional Screening:  Hepatitis C Screening: does qualify; Completed 08/27/1998  Vision Screening: Recommended annual ophthalmology exams for early detection of glaucoma and other disorders of the eye. Is the patient up to date with their annual eye exam?  Yes  Who is the provider or what is the name of the office in which the patient attends annual eye exams? [facility] If pt is not established with a provider, would they like to be referred to a provider  to establish care? No .   Dental Screening: Recommended annual dental exams for proper oral hygiene  Community Resource Referral / Chronic Care Management: CRR required this visit?  No   CCM required this visit?  No      Plan:     I have personally reviewed and noted the following in the patient's chart:   Medical and social history Use of alcohol, tobacco or illicit drugs  Current medications and supplements including opioid prescriptions. Patient is not currently taking opioid prescriptions. Functional ability and status Nutritional status Physical activity Advanced directives List of other physicians Hospitalizations, surgeries, and ER visits in previous 12 months Vitals Screenings to include cognitive, depression, and falls Referrals and appointments  In addition, I have reviewed and discussed with patient certain preventive protocols, quality metrics, and best practice recommendations. A written  personalized care plan for preventive services as well as general preventive health recommendations were provided to patient.     Maryan Puls, LPN   08/31/1094   Nurse Notes: Pt declined bone scan and colonoscopy.

## 2023-01-01 NOTE — Patient Instructions (Signed)
Cindy [name] , Thank you for taking time to come for your Medicare Wellness Visit. I appreciate your ongoing commitment to your health goals. Please review the following plan we discussed and let me know if I can assist you in the future.   These are the goals we discussed:  Goals       Increase physical activity (pt-stated)      Starting 03/11/2017, I will resume exercise as tolerated for at least 30 min 2-3 days per week.       Patient Stated      Starting 03/16/2018, I will continue to take medications as prescribed.        Patient Stated      03/31/2020, I will maintain and continue medications as prescribed.       Patient Stated      No new goals.        This is a list of the screening recommended for you and due dates:  Health Maintenance  Topic Date Due   Complete foot exam   Never done   Zoster (Shingles) Vaccine (1 of 2) 08/10/1964   Yearly kidney health urinalysis for diabetes  04/21/2011   DTaP/Tdap/Td vaccine (2 - Tdap) 08/26/2014   COVID-19 Vaccine (6 - 2023-24 season) 04/26/2022   Colon Cancer Screening  07/04/2022   Hemoglobin A1C  10/06/2022   Eye exam for diabetics  10/16/2022   DEXA scan (bone density measurement)  04/06/2023*   Mammogram  01/30/2023   Flu Shot  03/27/2023   Yearly kidney function blood test for diabetes  09/07/2023   Medicare Annual Wellness Visit  01/01/2024   Pneumonia Vaccine  Completed   Hepatitis C Screening: USPSTF Recommendation to screen - Ages 18-79 yo.  Completed   HPV Vaccine  Aged Out  *Topic was postponed. The date shown is not the original due date.    Advanced directives: Please bring a copy of your health care power of attorney and living will to the office to be added to your chart at your convenience.   Conditions/risks identified: Aim for 30 minutes of exercise or brisk walking, 6-8 glasses of water, and 5 servings of fruits and vegetables each day.   Next appointment: Follow up in one year for your annual wellness  visit 02/22/24 @ 8:30 televisit   Preventive Care 65 Years and Older, Female Preventive care refers to lifestyle choices and visits with your health care provider that can promote health and wellness. What does preventive care include? A yearly physical exam. This is also called an annual well check. Dental exams once or twice a year. Routine eye exams. Ask your health care provider how often you should have your eyes checked. Personal lifestyle choices, including: Daily care of your teeth and gums. Regular physical activity. Eating a healthy diet. Avoiding tobacco and drug use. Limiting alcohol use. Practicing safe sex. Taking low-dose aspirin every day. Taking vitamin and mineral supplements as recommended by your health care provider. What happens during an annual well check? The services and screenings done by your health care provider during your annual well check will depend on your age, overall health, lifestyle risk factors, and family history of disease. Counseling  Your health care provider may ask you questions about your: Alcohol use. Tobacco use. Drug use. Emotional well-being. Home and relationship well-being. Sexual activity. Eating habits. History of falls. Memory and ability to understand (cognition). Work and work Astronomer. Reproductive health. Screening  You may have the following tests or measurements:  Height, weight, and BMI. Blood pressure. Lipid and cholesterol levels. These may be checked every 5 years, or more frequently if you are over 37 years old. Skin check. Lung cancer screening. You may have this screening every year starting at age 18 if you have a 30-pack-year history of smoking and currently smoke or have quit within the past 15 years. Fecal occult blood test (FOBT) of the stool. You may have this test every year starting at age 105. Flexible sigmoidoscopy or colonoscopy. You may have a sigmoidoscopy every 5 years or a colonoscopy every 10  years starting at age 59. Hepatitis C blood test. Hepatitis B blood test. Sexually transmitted disease (STD) testing. Diabetes screening. This is done by checking your blood sugar (glucose) after you have not eaten for a while (fasting). You may have this done every 1-3 years. Bone density scan. This is done to screen for osteoporosis. You may have this done starting at age 47. Mammogram. This may be done every 1-2 years. Talk to your health care provider about how often you should have regular mammograms. Talk with your health care provider about your test results, treatment options, and if necessary, the need for more tests. Vaccines  Your health care provider may recommend certain vaccines, such as: Influenza vaccine. This is recommended every year. Tetanus, diphtheria, and acellular pertussis (Tdap, Td) vaccine. You may need a Td booster every 10 years. Zoster vaccine. You may need this after age 81. Pneumococcal 13-valent conjugate (PCV13) vaccine. One dose is recommended after age 77. Pneumococcal polysaccharide (PPSV23) vaccine. One dose is recommended after age 57. Talk to your health care provider about which screenings and vaccines you need and how often you need them. This information is not intended to replace advice given to you by your health care provider. Make sure you discuss any questions you have with your health care provider. Document Released: 09/08/2015 Document Revised: 05/01/2016 Document Reviewed: 06/13/2015 Elsevier Interactive Patient Education  2017 [employer].  Fall Prevention in the Home Falls can cause injuries. They can happen to people of all ages. There are many things you can do to make your home safe and to help prevent falls. What can I do on the outside of my home? Regularly fix the edges of walkways and driveways and fix any cracks. Remove anything that might make you trip as you walk through a door, such as a raised step or threshold. Trim any  bushes or trees on the path to your home. Use bright outdoor lighting. Clear any walking paths of anything that might make someone trip, such as rocks or tools. Regularly check to see if handrails are loose or broken. Make sure that both sides of any steps have handrails. Any raised decks and porches should have guardrails on the edges. Have any leaves, snow, or ice cleared regularly. Use sand or salt on walking paths during winter. Clean up any spills in your garage right away. This includes oil or grease spills. What can I do in the bathroom? Use night lights. Install grab bars by the toilet and in the tub and shower. Do not use towel bars as grab bars. Use non-skid mats or decals in the tub or shower. If you need to sit down in the shower, use a plastic, non-slip stool. Keep the floor dry. Clean up any water that spills on the floor as soon as it happens. Remove soap buildup in the tub or shower regularly. Attach bath mats securely with double-sided non-slip rug  tape. Do not have throw rugs and other things on the floor that can make you trip. What can I do in the bedroom? Use night lights. Make sure that you have a light by your bed that is easy to reach. Do not use any sheets or blankets that are too big for your bed. They should not hang down onto the floor. Have a firm chair that has side arms. You can use this for support while you get dressed. Do not have throw rugs and other things on the floor that can make you trip. What can I do in the kitchen? Clean up any spills right away. Avoid walking on wet floors. Keep items that you use a lot in easy-to-reach places. If you need to reach something above you, use a strong step stool that has a grab bar. Keep electrical cords out of the way. Do not use floor polish or wax that makes floors slippery. If you must use wax, use non-skid floor wax. Do not have throw rugs and other things on the floor that can make you trip. What can I do  with my stairs? Do not leave any items on the stairs. Make sure that there are handrails on both sides of the stairs and use them. Fix handrails that are broken or loose. Make sure that handrails are as long as the stairways. Check any carpeting to make sure that it is firmly attached to the stairs. Fix any carpet that is loose or worn. Avoid having throw rugs at the top or bottom of the stairs. If you do have throw rugs, attach them to the floor with carpet tape. Make sure that you have a light switch at the top of the stairs and the bottom of the stairs. If you do not have them, ask someone to add them for you. What else can I do to help prevent falls? Wear shoes that: Do not have high heels. Have rubber bottoms. Are comfortable and fit you well. Are closed at the toe. Do not wear sandals. If you use a stepladder: Make sure that it is fully opened. Do not climb a closed stepladder. Make sure that both sides of the stepladder are locked into place. Ask someone to hold it for you, if possible. Clearly mark and make sure that you can see: Any grab bars or handrails. First and last steps. Where the edge of each step is. Use tools that help you move around (mobility aids) if they are needed. These include: Canes. Walkers. Scooters. Crutches. Turn on the lights when you go into a dark area. Replace any light bulbs as soon as they burn out. Set up your furniture so you have a clear path. Avoid moving your furniture around. If any of your floors are uneven, fix them. If there are any pets around you, be aware of where they are. Review your medicines with your doctor. Some medicines can make you feel dizzy. This can increase your chance of falling. Ask your doctor what other things that you can do to help prevent falls. This information is not intended to replace advice given to you by your health care provider. Make sure you discuss any questions you have with your health care  provider. Document Released: 06/08/2009 Document Revised: 01/18/2016 Document Reviewed: 09/16/2014 Elsevier Interactive Patient Education  2017 [employer].

## 2023-01-27 ENCOUNTER — Other Ambulatory Visit: Payer: Self-pay | Admitting: Family Medicine

## 2023-01-27 DIAGNOSIS — Z1231 Encounter for screening mammogram for malignant neoplasm of breast: Secondary | ICD-10-CM

## 2023-02-14 ENCOUNTER — Ambulatory Visit
Admission: RE | Admit: 2023-02-14 | Discharge: 2023-02-14 | Disposition: A | Discharge status: Home / Self Care | Payer: Medicare Other | Source: Ambulatory Visit | Attending: Family Medicine | Admitting: Family Medicine

## 2023-02-14 DIAGNOSIS — Z1231 Encounter for screening mammogram for malignant neoplasm of breast: Secondary | ICD-10-CM | POA: Diagnosis not present

## 2023-02-28 ENCOUNTER — Other Ambulatory Visit: Payer: Self-pay | Admitting: Physician Assistant

## 2023-02-28 NOTE — Telephone Encounter (Signed)
Last Fill: 12/02/2022  Labs: 09/06/2022 CBC, and CMP are within normal limits. Uric acid is in the desirable range.   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

## 2023-03-14 ENCOUNTER — Ambulatory Visit: Payer: Medicare Other | Admitting: Rheumatology

## 2023-03-19 NOTE — Progress Notes (Unsigned)
Office Visit Note  Patient: Cindy [name]             Date of Birth: [birth year]           MRN: [id number]             PCP: Judy Pimple, MD Referring: Tower, Audrie Gallus, MD Visit Date: 04/02/2023 Occupation: @GUAROCC @  Subjective:  Medication monitoring   History of Present Illness: Cindy [name] is a 77 y.o. female with history of gout and osteoarthritis.  Patient remains on allopurinol 100 mg 2 tablets by mouth daily and colchicine 0.6mg  1 tablet by mouth daily as needed.  She is tolerating allopurinol without any side effects and has not missed any doses recently.  She has not needed to take colchicine since her last office visit.  She has not had any signs or symptoms of a gout flare.  Patient states she completed physical therapy for treatment of bursitis, balance, and lower back pain.  She states that she noticed improvement while going to physical therapy but has had some increased discomfort since then.  Patient plans on resuming home exercises.  She denies any joint swelling at this time.   Activities of Daily Living:  Patient reports morning stiffness for 20-30 minutes.   Patient Reports nocturnal pain.  Difficulty dressing/grooming: Denies Difficulty climbing stairs: Reports Difficulty getting out of chair: Denies Difficulty using hands for taps, buttons, cutlery, and/or writing: Denies  Review of Systems  Constitutional:  Negative for fatigue.  HENT:  Negative for mouth sores and mouth dryness.   Eyes:  Negative for dryness.  Respiratory:  Negative for shortness of breath.   Cardiovascular:  Negative for chest pain and palpitations.  Gastrointestinal:  Negative for blood in stool, constipation and diarrhea.  Endocrine: Negative for increased urination.  Genitourinary:  Negative for involuntary urination.  Musculoskeletal:  Positive for joint pain, joint pain, myalgias, muscle weakness, morning stiffness, muscle tenderness and myalgias. Negative for gait problem and  joint swelling.  Skin:  Negative for color change, rash, hair loss and sensitivity to sunlight.  Allergic/Immunologic: Negative for susceptible to infections.  Neurological:  Negative for dizziness and headaches.  Hematological:  Negative for swollen glands.  Psychiatric/Behavioral:  Negative for depressed mood and sleep disturbance. The patient is not nervous/anxious.     PMFS History:  Patient Active Problem List   Diagnosis Date Noted   Mitral valve insufficiency 12/26/2021   Postmenopausal status 12/26/2021   DM (diabetes mellitus) (HCC) 12/26/2021   Gout 02/02/2019   Colon cancer screening 03/11/2017   Routine general medical examination at a health care facility 02/28/2015   Encounter for Medicare annual wellness exam 02/03/2013   Osteopenia 02/03/2012   Prediabetes 10/10/2010   COLONIC POLYPS 03/05/2010   Hyperlipidemia 07/27/2009   Essential hypertension 07/27/2009   BASAL CELL CARCINOMA, HX OF 07/27/2009    Past Medical History:  Diagnosis Date   Benign neoplasm of colon    Gout    MVP (mitral valve prolapse)    very mild   Other abnormal glucose    Other and unspecified hyperlipidemia    Personal history of other malignant neoplasm of skin    Unspecified essential hypertension     Family History  Problem Relation Age of Onset   Macular degeneration Mother    Sudden death Father 56   Heart disease Brother    Gout Brother    Arthritis Brother    Healthy Daughter    Colon cancer Neg Hx  Esophageal cancer Neg Hx    Rectal cancer Neg Hx    Stomach cancer Neg Hx    Past Surgical History:  Procedure Laterality Date   CARDIAC CATHETERIZATION  1995   normal (after abnormal stress test)   COLONOSCOPY  07/01/03   polyps   MOHS SURGERY  11/09   for basal cell lesion   Social History   Social History Narrative   Retired-med [employer]   Husband had advanced parkinson's and dementia, passed away in 04/26/13.   No regular exercise   Immunization History   Administered Date(s) Administered   Fluad Quad(high Dose 65+) 05/25/2019   Hepatitis B 07/03/1988, 07/30/1988, 01/01/1989   Influenza Split 06/04/2011   Influenza Whole 04/26/2009, 05/26/2009, 05/28/2010, 06/02/2012   Influenza, High Dose Seasonal PF 06/17/2018   Influenza,inj,Quad PF,6+ Mos 06/18/2017   Influenza-Unspecified 06/01/2013, 05/27/2014, 05/26/2018   PFIZER Comirnaty(Gray Top)Covid-19 Tri-Sucrose Vaccine 12/01/2020   PFIZER(Purple Top)SARS-COV-2 Vaccination 09/05/2019, 09/26/2019, 05/30/2020   Pfizer Covid-19 Vaccine Bivalent Booster 52yrs & up 05/25/2021   Pneumococcal Conjugate-13 01/12/2015   Pneumococcal Polysaccharide-23 10/24/2010   Td 08/26/2004   Zoster, Live 05/07/2013   Zoster, Unspecified 09/20/2021, 12/04/2021     Objective: Vital Signs: BP (!) 184/82 (BP Location: Left Arm, Patient Position: Sitting, Cuff Size: Normal)   Pulse (!) 59   Resp 14   Ht 5' (1.524 m)   Wt 138 lb 3.2 oz (62.7 kg)   BMI 26.99 kg/m    Physical Exam Vitals and nursing note reviewed.  Constitutional:      Appearance: She is well-developed.  HENT:     Head: Normocephalic and atraumatic.  Eyes:     Conjunctiva/sclera: Conjunctivae normal.  Cardiovascular:     Rate and Rhythm: Normal rate and regular rhythm.     Heart sounds: Normal heart sounds.  Pulmonary:     Effort: Pulmonary effort is normal.     Breath sounds: Normal breath sounds.  Abdominal:     General: Bowel sounds are normal.     Palpations: Abdomen is soft.  Musculoskeletal:     Cervical back: Normal range of motion.  Lymphadenopathy:     Cervical: No cervical adenopathy.  Skin:    General: Skin is warm and dry.     Capillary Refill: Capillary refill takes less than 2 seconds.  Neurological:     Mental Status: She is alert and oriented to person, place, and time.  Psychiatric:        Behavior: Behavior normal.      Musculoskeletal Exam: C-spine has good range of motion.  Thoracic kyphosis noted.   Shoulder joints, elbow joints, wrist joints, MCPs, PIPs, DIPs have good range of motion with no synovitis.  Mild PIP and DIP thickening.  Complete fist formation bilaterally.  Hip joints have good range of motion with no groin pain.  Tenderness over bilateral trochanteric bursa.  Knee joints have good range of motion with no warmth or effusion.  Ankle joints have good range of motion with no tenderness or synovitis.  No tenderness or synovitis over MTP joints.  CDAI Exam: CDAI Score: -- Patient Global: --; Provider Global: -- Swollen: --; Tender: -- Joint Exam 04/02/2023   No joint exam has been documented for this visit   There is currently no information documented on the homunculus. Go to the Rheumatology activity and complete the homunculus joint exam.  Investigation: No additional findings.  Imaging: No results found.  Recent Labs: Lab Results  Component Value Date   WBC 8.1  09/06/2022   HGB 14.0 09/06/2022   PLT 294 09/06/2022   NA 138 09/06/2022   K 4.2 09/06/2022   CL 101 09/06/2022   CO2 26 09/06/2022   GLUCOSE 105 (H) 09/06/2022   BUN 17 09/06/2022   CREATININE 0.83 09/06/2022   BILITOT 0.6 09/06/2022   ALKPHOS 70 03/29/2019   AST 30 09/06/2022   ALT 28 09/06/2022   PROT 7.4 09/06/2022   ALBUMIN 4.4 03/29/2019   CALCIUM 9.6 09/06/2022   GFRAA 76 08/30/2020    Speciality Comments: No specialty comments available.  Procedures:  No procedures performed Allergies: Ampicillin, Calcium, Cephalexin, and Colchicine   Assessment / Plan:     Visit Diagnoses: Idiopathic chronic gout of multiple sites without tophus - She has not had any signs or symptoms of a gout flare.  She has clinically been doing well taking allopurinol 200 mg daily and colchicine as needed during flares.  She has not needed to take colchicine since prior to her last office visit.  Her uric acid level was 2.6 on 09/06/2022.  Plan to check uric acid level today.  She will remain on allopurinol as  prescribed.  She was advised to notify us if she develops signs or symptoms of a flare.  She follow-up in the office in 6 months or sooner if needed.  Hyperuricemia - Uric acid 2.6 on 09/06/22.  Plan to check uric acid level today.  Plan: Uric acid  Medication monitoring encounter - Allopurinol 100 mg 2 tablets by mouth daily, colchicine 0.6mg  1 tablet by mouth daily as needed.  Uric acid 2.6 on 09/06/22.  Order for uric acid, CBC, and CMP will be updated today.  - Plan: CBC with Differential/Platelet, COMPLETE METABOLIC PANEL WITH GFR, Uric acid  Primary osteoarthritis of both hands: Mild PIP and DIP thickening consistent with osteoarthritis of both hands noted.  No tenderness or synovitis.  Complete fist formation bilaterally.  Pain in right hip: She has good range of motion of the right hip joint with no groin pain currently.  Trochanteric bursitis of both hips: Completed physical therapy.  Patient was encouraged to resume home exercises.  Primary osteoarthritis of both feet: No tenderness or synovitis noted on examination today.  Good range of motion of both ankle joints with no tenderness or inflammation.  She is wearing proper fitting shoes.  Chronic midline low back pain without sciatica: Patient completed physical therapy and plans on continuing home exercises.  Postural kyphosis of thoracic region  Other medical conditions are listed as follows:  Prediabetes  History of hyperlipidemia  Essential hypertension: Blood pressure was elevated today in the office and was rechecked prior to leaving.  Patient was advised to monitor blood pressure closely and if it remains elevated to reach out to her PCP.  Hx of colonic polyps  History of basal cell carcinoma  History of alcohol use  Orders: Orders Placed This Encounter  Procedures   CBC with Differential/Platelet   COMPLETE METABOLIC PANEL WITH GFR   Uric acid   No orders of the defined types were placed in this  encounter.   Follow-Up Instructions: Return in about 6 months (around 10/03/2023) for Osteoarthritis.   Gearldine Bienenstock, PA-C  Note - This record has been created using Dragon software.  Chart creation errors have been sought, but may not always  have been located. Such creation errors do not reflect on  the standard of medical care.

## 2023-03-26 ENCOUNTER — Encounter (INDEPENDENT_AMBULATORY_CARE_PROVIDER_SITE_OTHER): Payer: Self-pay

## 2023-04-02 ENCOUNTER — Encounter: Payer: Self-pay | Admitting: Physician Assistant

## 2023-04-02 ENCOUNTER — Ambulatory Visit: Payer: Medicare Other | Attending: Rheumatology | Admitting: Physician Assistant

## 2023-04-02 VITALS — BP 184/82 | HR 59 | Resp 14 | Ht 60.0 in | Wt 138.2 lb

## 2023-04-02 DIAGNOSIS — M19041 Primary osteoarthritis, right hand: Secondary | ICD-10-CM | POA: Diagnosis not present

## 2023-04-02 DIAGNOSIS — G8929 Other chronic pain: Secondary | ICD-10-CM | POA: Insufficient documentation

## 2023-04-02 DIAGNOSIS — M7061 Trochanteric bursitis, right hip: Secondary | ICD-10-CM | POA: Diagnosis not present

## 2023-04-02 DIAGNOSIS — Z87898 Personal history of other specified conditions: Secondary | ICD-10-CM | POA: Diagnosis not present

## 2023-04-02 DIAGNOSIS — M1A09X0 Idiopathic chronic gout, multiple sites, without tophus (tophi): Secondary | ICD-10-CM | POA: Diagnosis not present

## 2023-04-02 DIAGNOSIS — R7303 Prediabetes: Secondary | ICD-10-CM | POA: Insufficient documentation

## 2023-04-02 DIAGNOSIS — M7062 Trochanteric bursitis, left hip: Secondary | ICD-10-CM | POA: Insufficient documentation

## 2023-04-02 DIAGNOSIS — E790 Hyperuricemia without signs of inflammatory arthritis and tophaceous disease: Secondary | ICD-10-CM | POA: Diagnosis not present

## 2023-04-02 DIAGNOSIS — Z5181 Encounter for therapeutic drug level monitoring: Secondary | ICD-10-CM | POA: Insufficient documentation

## 2023-04-02 DIAGNOSIS — Z8639 Personal history of other endocrine, nutritional and metabolic disease: Secondary | ICD-10-CM | POA: Diagnosis not present

## 2023-04-02 DIAGNOSIS — Z86010 Personal history of colonic polyps: Secondary | ICD-10-CM | POA: Insufficient documentation

## 2023-04-02 DIAGNOSIS — M1A0790 Idiopathic chronic gout, unspecified ankle and foot, without tophus (tophi): Secondary | ICD-10-CM

## 2023-04-02 DIAGNOSIS — M19072 Primary osteoarthritis, left ankle and foot: Secondary | ICD-10-CM | POA: Insufficient documentation

## 2023-04-02 DIAGNOSIS — Z85828 Personal history of other malignant neoplasm of skin: Secondary | ICD-10-CM | POA: Insufficient documentation

## 2023-04-02 DIAGNOSIS — M4004 Postural kyphosis, thoracic region: Secondary | ICD-10-CM | POA: Diagnosis not present

## 2023-04-02 DIAGNOSIS — M19071 Primary osteoarthritis, right ankle and foot: Secondary | ICD-10-CM | POA: Insufficient documentation

## 2023-04-02 DIAGNOSIS — I10 Essential (primary) hypertension: Secondary | ICD-10-CM | POA: Diagnosis not present

## 2023-04-02 DIAGNOSIS — M19042 Primary osteoarthritis, left hand: Secondary | ICD-10-CM | POA: Insufficient documentation

## 2023-04-02 DIAGNOSIS — M545 Low back pain, unspecified: Secondary | ICD-10-CM | POA: Insufficient documentation

## 2023-04-02 DIAGNOSIS — M25551 Pain in right hip: Secondary | ICD-10-CM | POA: Diagnosis not present

## 2023-04-02 LAB — CBC WITH DIFFERENTIAL/PLATELET
Absolute Monocytes: 1021 cells/uL — ABNORMAL HIGH (ref 200–950)
Basophils Absolute: 58 cells/uL (ref 0–200)
Basophils Relative: 0.7 %
Eosinophils Absolute: 141 cells/uL (ref 15–500)
Eosinophils Relative: 1.7 %
HCT: 40.7 % (ref 35.0–45.0)
Hemoglobin: 13.9 g/dL (ref 11.7–15.5)
Lymphs Abs: 2573 cells/uL (ref 850–3900)
MCH: 32.2 pg (ref 27.0–33.0)
MCHC: 34.2 g/dL (ref 32.0–36.0)
MCV: 94.2 fL (ref 80.0–100.0)
MPV: 10.7 fL (ref 7.5–12.5)
Monocytes Relative: 12.3 %
Neutro Abs: 4507 cells/uL (ref 1500–7800)
Neutrophils Relative %: 54.3 %
Platelets: 290 10*3/uL (ref 140–400)
RBC: 4.32 10*6/uL (ref 3.80–5.10)
RDW: 13.8 % (ref 11.0–15.0)
Total Lymphocyte: 31 %
WBC: 8.3 10*3/uL (ref 3.8–10.8)

## 2023-04-03 NOTE — Progress Notes (Signed)
CMP WNL Absolute monocytes are borderline elevated. Rest of CBC WNL.  Uric acid WNL.

## 2023-04-08 ENCOUNTER — Encounter: Payer: Self-pay | Admitting: Family Medicine

## 2023-04-08 ENCOUNTER — Ambulatory Visit (INDEPENDENT_AMBULATORY_CARE_PROVIDER_SITE_OTHER): Payer: Medicare Other | Admitting: Family Medicine

## 2023-04-08 VITALS — BP 133/80 | HR 67 | Temp 97.8°F | Ht 58.75 in | Wt 136.2 lb

## 2023-04-08 DIAGNOSIS — D126 Benign neoplasm of colon, unspecified: Secondary | ICD-10-CM

## 2023-04-08 DIAGNOSIS — Z1211 Encounter for screening for malignant neoplasm of colon: Secondary | ICD-10-CM

## 2023-04-08 DIAGNOSIS — E7800 Pure hypercholesterolemia, unspecified: Secondary | ICD-10-CM

## 2023-04-08 DIAGNOSIS — M8580 Other specified disorders of bone density and structure, unspecified site: Secondary | ICD-10-CM

## 2023-04-08 DIAGNOSIS — M10079 Idiopathic gout, unspecified ankle and foot: Secondary | ICD-10-CM | POA: Diagnosis not present

## 2023-04-08 DIAGNOSIS — R7303 Prediabetes: Secondary | ICD-10-CM

## 2023-04-08 DIAGNOSIS — I10 Essential (primary) hypertension: Secondary | ICD-10-CM

## 2023-04-08 LAB — HEMOGLOBIN A1C: Hgb A1c MFr Bld: 6.1 % (ref 4.6–6.5)

## 2023-04-08 LAB — LIPID PANEL
Cholesterol: 200 mg/dL (ref 0–200)
HDL: 60.9 mg/dL (ref >39.00)
LDL Cholesterol: 101 mg/dL — ABNORMAL HIGH (ref 0–99)
NonHDL: 139.47
Total CHOL/HDL Ratio: 3
Triglycerides: 192 mg/dL — ABNORMAL HIGH (ref 0.0–149.0)
VLDL: 38.4 mg/dL (ref 0.0–40.0)

## 2023-04-08 LAB — TSH: TSH: 4.65 u[IU]/mL (ref 0.35–5.50)

## 2023-04-08 MED ORDER — ATORVASTATIN CALCIUM 20 MG PO TABS: ORAL_TABLET | ORAL | 3 refills | Status: DC

## 2023-04-08 MED ORDER — LOSARTAN POTASSIUM 100 MG PO TABS: ORAL_TABLET | ORAL | 3 refills | Status: DC

## 2023-04-08 NOTE — Assessment & Plan Note (Signed)
Declines another dexa (will let us knwo if she changes her mind) No falls or fracture Takes D3   Encouraged strongly to start more exercise/ strength training

## 2023-04-08 NOTE — Assessment & Plan Note (Signed)
Disc goals for lipids and reasons to control them Rev last labs with pt Rev low sat fat diet in detail  Labs drawn Good diet  Continues atorvastatin 20 mg daily

## 2023-04-08 NOTE — Assessment & Plan Note (Signed)
Declines colonoscopy

## 2023-04-08 NOTE — Assessment & Plan Note (Signed)
bp in fair control at this time  BP Readings from Last 1 Encounters:  04/08/23 133/80   No changes needed Most recent labs reviewed  Disc lifstyle change with low sodium diet and exercise  Labs reviewed (from rheum)  Labs ordered / lipid and tsh and A1c  At home blood pressure averages 120s/60s

## 2023-04-08 NOTE — Assessment & Plan Note (Signed)
a1c drawn disc imp of low glycemic diet and wt loss to prevent DM2  Encouraged strength training exercise as well

## 2023-04-08 NOTE — Assessment & Plan Note (Signed)
Pt has decided not to repeat colonoscopy at her age despite polyps in 2018  Will let us know if changes mind Discussed pros/cons Had some complications with last one

## 2023-04-08 NOTE — Assessment & Plan Note (Signed)
Continues rheum care Lab Results  Component Value Date   LABURIC 2.5 04/02/2023    Continues allopurinol 200 mg daily  Prn colchicine  No recent flares

## 2023-04-08 NOTE — Progress Notes (Signed)
Subjective:    Patient ID: Cindy [name], female    DOB: 09/26/1945, 77 y.o.   MRN: [id number]  HPI  Here for annual  review chronic medical problems   Wt Readings from Last 3 Encounters:  04/08/23 136 lb 4 oz (61.8 kg)  04/02/23 138 lb 3.2 oz (62.7 kg)  01/01/23 136 lb (61.7 kg)   27.75 kg/m  Vitals:   04/08/23 0954 04/08/23 1023  BP: (!) 148/78 133/80  Pulse: 67   Temp: 97.8 F (36.6 C)   SpO2: 98%     Immunization History  Administered Date(s) Administered   Fluad Quad(high Dose 65+) 05/25/2019   Hepatitis B 07/03/1988, 07/30/1988, 01/01/1989   Influenza Split 06/04/2011   Influenza Whole 04/26/2009, 05/26/2009, 05/28/2010, 06/02/2012   Influenza, High Dose Seasonal PF 06/17/2018   Influenza,inj,Quad PF,6+ Mos 06/18/2017   Influenza-Unspecified 06/01/2013, 05/27/2014, 05/26/2018   PFIZER Comirnaty(Gray Top)Covid-19 Tri-Sucrose Vaccine 12/01/2020   PFIZER(Purple Top)SARS-COV-2 Vaccination 09/05/2019, 09/26/2019, 05/30/2020   Pfizer Covid-19 Vaccine Bivalent Booster 20yrs & up 05/25/2021   Pneumococcal Conjugate-13 01/12/2015   Pneumococcal Polysaccharide-23 10/24/2010   Td 08/26/2004   Zoster Recombinant(Shingrix) 09/20/2021, 12/04/2021   Zoster, Live 05/07/2013    Health Maintenance Due  Topic Date Due   FOOT EXAM  Never done   DEXA SCAN  Never done   Diabetic kidney evaluation - Urine ACR  04/21/2011   DTaP/Tdap/Td (2 - Tdap) 08/26/2014   Colonoscopy  07/04/2022   HEMOGLOBIN A1C  10/06/2022   OPHTHALMOLOGY EXAM  10/16/2022   Doing well overall  Planning some travel    Mammogram 01/2023  Self breast exam: no lumps   Gyn health-does not see gyn  No problems    Colon cancer screening - colonoscopy 06/2017 - had 5 y recall at that time  Per pt had difficulties with her last one  Declines due to age and risk    Bone health  Dexa -declined in past  Remote history of osteopenia  Falls: none Fractures: none  Supplements -vitamin D Exercise -  went to PT and doing it at home now (for bursitis) Has a good set of equip for home   Sees dermatology regularly    Mood    04/08/2023    9:59 AM 01/01/2023    8:37 AM 04/05/2022   10:23 AM 12/26/2021    8:53 AM 04/04/2021    9:35 AM  Depression screen PHQ 2/9  Decreased Interest 0 0 0 0 0  Down, Depressed, Hopeless 0 0 0 0 0  PHQ - 2 Score 0 0 0 0 0  Altered sleeping 0    0  Tired, decreased energy 0    0  Change in appetite 0    0  Feeling bad or failure about yourself  0    0  Trouble concentrating 0    0  Moving slowly or fidgety/restless 0    0  Suicidal thoughts 0    0  PHQ-9 Score 0    0  Difficult doing work/chores Not difficult at all       HTN bp is stable today  No cp or palpitations or headaches or edema  No side effects to medicines  BP Readings from Last 3 Encounters:  04/08/23 133/80  04/02/23 (!) 184/82  09/06/22 (!) 179/93    At home sp averages in 120s/60s  Always higher here   Atenolol 25 mg daily  Losartan 100 mg daily   Lab Results  Component Value Date  NA 139 04/02/2023   K 4.3 04/02/2023   CO2 27 04/02/2023   GLUCOSE 101 (H) 04/02/2023   BUN 19 04/02/2023   CREATININE 0.82 04/02/2023   CALCIUM 10.0 04/02/2023   GFR 62.77 11/22/2019   EGFR 74 04/02/2023   GFRNONAA 65 08/30/2020   Lab Results  Component Value Date   ALT 21 04/02/2023   AST 24 04/02/2023   ALKPHOS 70 03/29/2019   BILITOT 0.6 04/02/2023      Due for labs    Prediabetes Lab Results  Component Value Date   HGBA1C 6.3 04/05/2022   Due for labs  History of gout Sees rheum for this  Allopurinol 200 mg daily and prn colchicine  Uric acid 2.6 on 09/06/22  Blood sugar was 100 after 2-3 h fast  Diet is fine   Protein Cheese Meat Beans  Nuts and nut butters    Last rheum labs Lab Results  Component Value Date   LABURIC 2.5 04/02/2023   Lab Results  Component Value Date   NA 139 04/02/2023   K 4.3 04/02/2023   CO2 27 04/02/2023   GLUCOSE 101 (H)  04/02/2023   BUN 19 04/02/2023   CREATININE 0.82 04/02/2023   CALCIUM 10.0 04/02/2023   GFR 62.77 11/22/2019   EGFR 74 04/02/2023   GFRNONAA 65 08/30/2020   Lab Results  Component Value Date   WBC 8.3 04/02/2023   HGB 13.9 04/02/2023   HCT 40.7 04/02/2023   MCV 94.2 04/02/2023   PLT 290 04/02/2023       Hyperlipidemia Lab Results  Component Value Date   CHOL 184 04/05/2022   HDL 57.20 04/05/2022   LDLCALC 76 02/07/2014   LDLDIRECT 94.0 04/05/2022   TRIG 225.0 (H) 04/05/2022   CHOLHDL 3 04/05/2022   Due for labs  Atorvasatatin 20 mg daily      Patient Active Problem List   Diagnosis Date Noted   Mitral valve insufficiency 12/26/2021   Postmenopausal status 12/26/2021   Gout 02/02/2019   Colon cancer screening 03/11/2017   Routine general medical examination at a health care facility 02/28/2015   Encounter for Medicare annual wellness exam 02/03/2013   Osteopenia 02/03/2012   Prediabetes 10/10/2010   COLONIC POLYPS 03/05/2010   Hyperlipidemia 07/27/2009   Essential hypertension 07/27/2009   BASAL CELL CARCINOMA, HX OF 07/27/2009   Past Medical History:  Diagnosis Date   Benign neoplasm of colon    Gout    MVP (mitral valve prolapse)    very mild   Other abnormal glucose    Other and unspecified hyperlipidemia    Personal history of other malignant neoplasm of skin    Unspecified essential hypertension    Past Surgical History:  Procedure Laterality Date   CARDIAC CATHETERIZATION  1995   normal (after abnormal stress test)   COLONOSCOPY  07/01/03   polyps   MOHS SURGERY  11/09   for basal cell lesion   Social History   Tobacco Use   Smoking status: Never    Passive exposure: Past   Smokeless tobacco: Never  Vaping Use   Vaping status: Never Used  Substance Use Topics   Alcohol use: Yes    Alcohol/week: 0.0 standard drinks of alcohol    Comment: occasional   Drug use: No   Family History  Problem Relation Age of Onset   Macular  degeneration Mother    Sudden death Father 7   Heart disease Brother    Gout Brother  Arthritis Brother    Healthy Daughter    Colon cancer Neg Hx    Esophageal cancer Neg Hx    Rectal cancer Neg Hx    Stomach cancer Neg Hx    Allergies  Allergen Reactions   Ampicillin     REACTION: rash   Calcium     REACTION: GI intolerance   Cephalexin     REACTION: rash   Colchicine     Diarrhea   Current Outpatient Medications on File Prior to Visit  Medication Sig Dispense Refill   allopurinol (ZYLOPRIM) 100 MG tablet TAKE 2 TABLETS(200 MG) BY MOUTH DAILY 180 tablet 0   atenolol (TENORMIN) 25 MG tablet TAKE 1 TABLET(25 MG) BY MOUTH DAILY 90 tablet 3   Cholecalciferol (VITAMIN D-3) 1000 units CAPS Take 1 capsule by mouth daily.     colchicine 0.6 MG tablet Take 1 tablet (0.6 mg total) by mouth daily as needed. 90 tablet 0   Loperamide HCl (IMODIUM A-D PO) Take by mouth as needed.     triamcinolone cream (KENALOG) 0.1 % Apply 1 application topically 2 (two) times daily as needed.  2   No current facility-administered medications on file prior to visit.    Review of Systems  Constitutional:  Negative for activity change, appetite change, fatigue, fever and unexpected weight change.  HENT:  Negative for congestion, ear pain, rhinorrhea, sinus pressure and sore throat.   Eyes:  Negative for pain, redness and visual disturbance.  Respiratory:  Negative for cough, shortness of breath and wheezing.   Cardiovascular:  Negative for chest pain and palpitations.  Gastrointestinal:  Negative for abdominal pain, blood in stool, constipation and diarrhea.  Endocrine: Negative for polydipsia and polyuria.  Genitourinary:  Negative for dysuria, frequency and urgency.  Musculoskeletal:  Positive for arthralgias. Negative for back pain and myalgias.  Skin:  Negative for pallor and rash.  Allergic/Immunologic: Negative for environmental allergies.  Neurological:  Negative for dizziness, syncope and  headaches.  Hematological:  Negative for adenopathy. Does not bruise/bleed easily.  Psychiatric/Behavioral:  Negative for decreased concentration and dysphoric mood. The patient is not nervous/anxious.        Caring for elderly mother Is stresful        Objective:   Physical Exam Constitutional:      General: She is not in acute distress.    Appearance: Normal appearance. She is well-developed and normal weight. She is not ill-appearing or diaphoretic.  HENT:     Head: Normocephalic and atraumatic.     Right Ear: Tympanic membrane, ear canal and external ear normal.     Left Ear: Tympanic membrane, ear canal and external ear normal.     Nose: Nose normal. No congestion.     Mouth/Throat:     Mouth: Mucous membranes are moist.     Pharynx: Oropharynx is clear. No posterior oropharyngeal erythema.  Eyes:     General: No scleral icterus.    Extraocular Movements: Extraocular movements intact.     Conjunctiva/sclera: Conjunctivae normal.     Pupils: Pupils are equal, round, and reactive to light.  Neck:     Thyroid: No thyromegaly.     Vascular: No carotid bruit or JVD.  Cardiovascular:     Rate and Rhythm: Normal rate and regular rhythm.     Pulses: Normal pulses.     Heart sounds: Normal heart sounds.     No gallop.  Pulmonary:     Effort: Pulmonary effort is normal. No respiratory distress.  Breath sounds: Normal breath sounds. No wheezing.     Comments: Good air exch Chest:     Chest wall: No tenderness.  Abdominal:     General: Bowel sounds are normal. There is no distension or abdominal bruit.     Palpations: Abdomen is soft. There is no mass.     Tenderness: There is no abdominal tenderness.     Hernia: No hernia is present.  Genitourinary:    Comments: Breast exam: No mass, nodules, thickening, tenderness, bulging, retraction, inflamation, nipple discharge or skin changes noted.  No axillary or clavicular LA.     Musculoskeletal:        General: No tenderness.  Normal range of motion.     Cervical back: Normal range of motion and neck supple. No rigidity. No muscular tenderness.     Right lower leg: No edema.     Left lower leg: No edema.     Comments: No kyphosis   Lymphadenopathy:     Cervical: No cervical adenopathy.  Skin:    General: Skin is warm and dry.     Coloration: Skin is not pale.     Findings: No erythema or rash.     Comments: Few lentigines Fair complexion   Neurological:     Mental Status: She is alert. Mental status is at baseline.     Cranial Nerves: No cranial nerve deficit.     Motor: No abnormal muscle tone.     Coordination: Coordination normal.     Gait: Gait normal.     Deep Tendon Reflexes: Reflexes are normal and symmetric. Reflexes normal.  Psychiatric:        Mood and Affect: Mood normal.        Cognition and Memory: Cognition and memory normal.           Assessment & Plan:   Problem List Items Addressed This Visit       Cardiovascular and Mediastinum   Essential hypertension - Primary    bp in fair control at this time  BP Readings from Last 1 Encounters:  04/08/23 133/80   No changes needed Most recent labs reviewed  Disc lifstyle change with low sodium diet and exercise  Labs reviewed (from rheum)  Labs ordered / lipid and tsh and A1c  At home blood pressure averages 120s/60s       Relevant Medications   atorvastatin (LIPITOR) 20 MG tablet   losartan (COZAAR) 100 MG tablet   Other Relevant Orders   Lipid panel   TSH     Digestive   COLONIC POLYPS    Pt has decided not to repeat colonoscopy at her age despite polyps in 2018  Will let us know if changes mind Discussed pros/cons Had some complications with last one          Musculoskeletal and Integument   Osteopenia    Declines another dexa (will let us knwo if she changes her mind) No falls or fracture Takes D3   Encouraged strongly to start more exercise/ strength training         Other   Prediabetes    a1c  drawn disc imp of low glycemic diet and wt loss to prevent DM2  Encouraged strength training exercise as well      Relevant Orders   Hemoglobin A1c   Hyperlipidemia    Disc goals for lipids and reasons to control them Rev last labs with pt Rev low sat fat diet in detail  Labs  drawn Good diet  Continues atorvastatin 20 mg daily       Relevant Medications   atorvastatin (LIPITOR) 20 MG tablet   losartan (COZAAR) 100 MG tablet   Other Relevant Orders   Lipid panel   Gout    Continues rheum care Lab Results  Component Value Date   LABURIC 2.5 04/02/2023    Continues allopurinol 200 mg daily  Prn colchicine  No recent flares       Colon cancer screening    Declines colonoscopy

## 2023-04-08 NOTE — Patient Instructions (Addendum)
If you change your mind about a colonoscopy let us know  If you change your mind about bone density test let us know   Get your flu shot and covid shot in the fall    Stay active  Add some strength training to your routine, this is important for bone and brain health and can reduce your risk of falls and help your body use insulin properly and regulate weight  Light weights, exercise bands , and internet videos are a good way to start  Yoga (chair or regular), machines , floor exercises or a gym with machines are also good options    Try to get most of your carbohydrates from produce (with the exception of white potatoes) and whole grains Eat less bread/pasta/rice/snack foods/cereals/sweets and other items from the middle of the grocery store (processed carbs)  Labs today for cholesterol and A1c and thyroid

## 2023-05-18 DIAGNOSIS — Z23 Encounter for immunization: Secondary | ICD-10-CM | POA: Diagnosis not present

## 2023-05-29 ENCOUNTER — Other Ambulatory Visit: Payer: Self-pay | Admitting: Physician Assistant

## 2023-05-29 NOTE — Telephone Encounter (Signed)
Last Fill: 03/02/2023  Labs: 04/02/2023 CMP WNL Absolute monocytes are borderline elevated. Rest of CBC WNL.  Uric acid WNL.    Next Visit: 10/22/2023  Last Visit: 04/02/2023  DX: Idiopathic chronic gout of multiple sites without tophus   Current Dose per office note 04/02/2023: Allopurinol 100 mg 2 tablets by mouth daily   Okay to refill Allopurinol?

## 2023-06-22 DIAGNOSIS — Z23 Encounter for immunization: Secondary | ICD-10-CM | POA: Diagnosis not present

## 2023-07-02 ENCOUNTER — Other Ambulatory Visit: Payer: Self-pay | Admitting: Family Medicine

## 2023-08-31 ENCOUNTER — Other Ambulatory Visit: Payer: Self-pay | Admitting: Physician Assistant

## 2023-09-01 NOTE — Telephone Encounter (Signed)
 Last Fill: 05/29/2023  Labs: 04/02/2023 CMP WNL Absolute monocytes are borderline elevated. Rest of CBC WNL.  Uric acid WNL.    Next Visit: 10/22/2023  Last Visit: 04/02/2023  DX: Idiopathic chronic gout of multiple sites without tophus   Current Dose per office note 04/02/2023: allopurinol  200 mg daily   Okay to refill Allopurinol ?

## 2023-09-16 DIAGNOSIS — D225 Melanocytic nevi of trunk: Secondary | ICD-10-CM | POA: Diagnosis not present

## 2023-09-16 DIAGNOSIS — L821 Other seborrheic keratosis: Secondary | ICD-10-CM | POA: Diagnosis not present

## 2023-09-16 DIAGNOSIS — L538 Other specified erythematous conditions: Secondary | ICD-10-CM | POA: Diagnosis not present

## 2023-09-16 DIAGNOSIS — L814 Other melanin hyperpigmentation: Secondary | ICD-10-CM | POA: Diagnosis not present

## 2023-09-16 DIAGNOSIS — L2089 Other atopic dermatitis: Secondary | ICD-10-CM | POA: Diagnosis not present

## 2023-09-16 DIAGNOSIS — L720 Epidermal cyst: Secondary | ICD-10-CM | POA: Diagnosis not present

## 2023-09-16 DIAGNOSIS — L820 Inflamed seborrheic keratosis: Secondary | ICD-10-CM | POA: Diagnosis not present

## 2023-10-08 NOTE — Progress Notes (Signed)
 Office Visit Note  Patient: Cindy [name]             Date of Birth: 1945/10/18           MRN: [id number]             PCP: Judy Pimple, MD Referring: Tower, Audrie Gallus, MD Visit Date: 10/22/2023 Occupation: @GUAROCC @  Subjective:  Joint stiffness  History of Present Illness: Cindy [name] is a 78 y.o. female with gout and osteoarthritis.  She returns today after her last visit in August 2024.  She has not had any gout flares.  She states she has been taking allopurinol 200 mg daily without any interruption.  She did not have to take colchicine.  She states that her mother turned 100 last week.  She celebrated her mother's birthday on Saturday and was in dress shoes for a long time.  She had some discomfort in her feet after that but no swelling.  None of the other joints are painful.  She states physical therapy was very helpful and help with her balance.  She has not been as regular with the home exercises.    Activities of Daily Living:  Patient reports morning stiffness for 15 minutes.   Patient Denies nocturnal pain.  Difficulty dressing/grooming: Denies Difficulty climbing stairs: Reports Difficulty getting out of chair: Denies Difficulty using hands for taps, buttons, cutlery, and/or writing: Denies  Review of Systems  Constitutional:  Negative for fatigue.  HENT:  Negative for mouth sores and mouth dryness.   Eyes:  Negative for dryness.  Respiratory:  Negative for shortness of breath and difficulty breathing.   Cardiovascular:  Negative for chest pain and palpitations.  Gastrointestinal:  Negative for blood in stool, constipation and diarrhea.  Endocrine: Negative for increased urination.  Genitourinary:  Negative for involuntary urination.  Musculoskeletal:  Positive for morning stiffness. Negative for joint pain, gait problem, joint pain, joint swelling, myalgias, muscle weakness, muscle tenderness and myalgias.  Skin:  Negative for color change, rash, hair loss and  sensitivity to sunlight.  Allergic/Immunologic: Negative for susceptible to infections.  Neurological:  Negative for dizziness and headaches.  Hematological:  Negative for swollen glands.  Psychiatric/Behavioral:  Negative for depressed mood and sleep disturbance. The patient is not nervous/anxious.     PMFS History:  Patient Active Problem List   Diagnosis Date Noted   Mitral valve insufficiency 12/26/2021   Postmenopausal status 12/26/2021   Gout 02/02/2019   Colon cancer screening 03/11/2017   Routine general medical examination at a health care facility 02/28/2015   Encounter for Medicare annual wellness exam 02/03/2013   Osteopenia 02/03/2012   Prediabetes 10/10/2010   COLONIC POLYPS 03/05/2010   Hyperlipidemia 07/27/2009   Essential hypertension 07/27/2009   BASAL CELL CARCINOMA, HX OF 07/27/2009    Past Medical History:  Diagnosis Date   Benign neoplasm of colon    Gout    MVP (mitral valve prolapse)    very mild   Other abnormal glucose    Other and unspecified hyperlipidemia    Personal history of other malignant neoplasm of skin    Unspecified essential hypertension     Family History  Problem Relation Age of Onset   Macular degeneration Mother    Sudden death Father 30   Heart disease Brother    Gout Brother    Arthritis Brother    Healthy Daughter    Colon cancer Neg Hx    Esophageal cancer Neg Hx    Rectal  cancer Neg Hx    Stomach cancer Neg Hx    Past Surgical History:  Procedure Laterality Date   CARDIAC CATHETERIZATION  1995   normal (after abnormal stress test)   COLONOSCOPY  07/01/03   polyps   MOHS SURGERY  11/09   for basal cell lesion   Social History   Social History Narrative   Retired-med [employer]   Husband had advanced parkinson's and dementia, passed away in 11-06-2012.   No regular exercise   Immunization History  Administered Date(s) Administered   Fluad Quad(high Dose 65+) 05/25/2019   Hepatitis B 07/03/1988, 07/30/1988, 01/01/1989    Influenza Split 06/04/2011   Influenza Whole 04/26/2009, 05/26/2009, 05/28/2010, 06/02/2012   Influenza, High Dose Seasonal PF 06/17/2018   Influenza,inj,Quad PF,6+ Mos 06/18/2017   Influenza-Unspecified 06/01/2013, 05/27/2014, 05/26/2018   PFIZER Comirnaty(Gray Top)Covid-19 Tri-Sucrose Vaccine 12/01/2020   PFIZER(Purple Top)SARS-COV-2 Vaccination 09/05/2019, 09/26/2019, 05/30/2020   Pfizer Covid-19 Vaccine Bivalent Booster 33yrs & up 05/25/2021   Pneumococcal Conjugate-13 01/12/2015   Pneumococcal Polysaccharide-23 10/24/2010   Td 08/26/2004   Zoster Recombinant(Shingrix) 09/20/2021, 12/04/2021   Zoster, Live 05/07/2013     Objective: Vital Signs: BP (!) 173/85 (BP Location: Left Arm, Patient Position: Sitting, Cuff Size: Normal)   Pulse 81   Resp 13   Ht 5' (1.524 m)   Wt 139 lb 12.8 oz (63.4 kg)   BMI 27.30 kg/m    Physical Exam Vitals and nursing note reviewed.  Constitutional:      Appearance: She is well-developed.  HENT:     Head: Normocephalic and atraumatic.  Eyes:     Conjunctiva/sclera: Conjunctivae normal.  Cardiovascular:     Rate and Rhythm: Normal rate and regular rhythm.     Heart sounds: Normal heart sounds.  Pulmonary:     Effort: Pulmonary effort is normal.     Breath sounds: Normal breath sounds.  Abdominal:     General: Bowel sounds are normal.     Palpations: Abdomen is soft.  Musculoskeletal:     Cervical back: Normal range of motion.  Lymphadenopathy:     Cervical: No cervical adenopathy.  Skin:    General: Skin is warm and dry.     Capillary Refill: Capillary refill takes less than 2 seconds.  Neurological:     Mental Status: She is alert and oriented to person, place, and time.  Psychiatric:        Behavior: Behavior normal.      Musculoskeletal Exam: She had good range of motion of the cervical spine.  Thoracic kyphosis was noted.  There was no tenderness over thoracic or lumbar spine.  Shoulder joints, elbow joints, wrist joints  were in good range of motion.  She had bilateral CMC PIP and DIP thickening with no synovitis.  She has some limitation with range of motion of her hip joints and mild tenderness over right trochanteric bursa.  Knee joints in good range of motion without any warmth swelling or effusion.  There was no tenderness over ankles or MTPs.  CDAI Exam: CDAI Score: -- Patient Global: --; Provider Global: -- Swollen: --; Tender: -- Joint Exam 10/22/2023   No joint exam has been documented for this visit   There is currently no information documented on the homunculus. Go to the Rheumatology activity and complete the homunculus joint exam.  Investigation: No additional findings.  Imaging: No results found.  Recent Labs: Lab Results  Component Value Date   WBC 8.3 04/02/2023   HGB 13.9 04/02/2023  PLT 290 04/02/2023   NA 139 04/02/2023   K 4.3 04/02/2023   CL 101 04/02/2023   CO2 27 04/02/2023   GLUCOSE 101 (H) 04/02/2023   BUN 19 04/02/2023   CREATININE 0.82 04/02/2023   BILITOT 0.6 04/02/2023   ALKPHOS 70 03/29/2019   AST 24 04/02/2023   ALT 21 04/02/2023   PROT 7.3 04/02/2023   ALBUMIN 4.4 03/29/2019   CALCIUM 10.0 04/02/2023   GFRAA 76 08/30/2020    Speciality Comments: No specialty comments available.  Procedures:  No procedures performed Allergies: Ampicillin, Calcium, Cephalexin, and Colchicine   Assessment / Plan:     Visit Diagnoses: Idiopathic chronic gout of multiple sites without tophus -patient denies having a gout flare.  She has been on allopurinol 200 mg daily without any interruption.  She did not have to take colchicine.  Last uric acid level was 2.5 on April 02, 2023.  Will check uric acid level today.  Plan: Uric acid  Hyperuricemia - uric acid: 2.5 on 04/02/2023  Medication monitoring encounter - Allopurinol 100 mg 2 tablets by mouth daily, colchicine 0.6mg  1 tablet by mouth daily as needed. -CBC and CMP were normal on April 02, 2023.  Will check labs today.   Plan: CBC with Differential/Platelet, COMPLETE METABOLIC PANEL WITH GFR  Primary osteoarthritis of both hands-bilateral CMC PIP and DIP thickening was noted.  No synovitis was noted.  Trochanteric bursitis of both hips-she continues to have some discomfort in her right trochanteric region.  IT band stretches were advised.  Primary osteoarthritis of both feet-patient states she started experiencing increased pain in her feet after prolonged standing last Saturday to celebrate her mom's 100th birthday.  She did not notice any joint swelling.  Chronic midline low back pain without sciatica-she did any discomfort today.  Postural kyphosis of thoracic region-x-ray signs were advised.  Other medical problems are listed as follows:  Prediabetes  History of hyperlipidemia  Hx of colonic polyps  Essential hypertension-blood pressure was elevated at 173/85.  Patient states that she took her blood pressure at home which was normal.  History of basal cell carcinoma  History of alcohol use  Orders: Orders Placed This Encounter  Procedures   CBC with Differential/Platelet   COMPLETE METABOLIC PANEL WITH GFR   Uric acid   Meds ordered this encounter  Medications   colchicine 0.6 MG tablet    Sig: Take 1 tablet (0.6 mg total) by mouth daily as needed.    Dispense:  90 tablet    Refill:  0     Follow-Up Instructions: Return in about 6 months (around 04/20/2024) for Osteoarthritis, Gout.   Pollyann Savoy, MD  Note - This record has been created using Animal nutritionist.  Chart creation errors have been sought, but may not always  have been located. Such creation errors do not reflect on  the standard of medical care.

## 2023-10-22 ENCOUNTER — Ambulatory Visit: Payer: Medicare Other | Attending: Rheumatology | Admitting: Rheumatology

## 2023-10-22 ENCOUNTER — Encounter: Payer: Self-pay | Admitting: Rheumatology

## 2023-10-22 VITALS — BP 173/85 | HR 81 | Resp 13 | Ht 60.0 in | Wt 139.8 lb

## 2023-10-22 DIAGNOSIS — Z8639 Personal history of other endocrine, nutritional and metabolic disease: Secondary | ICD-10-CM | POA: Insufficient documentation

## 2023-10-22 DIAGNOSIS — Z85828 Personal history of other malignant neoplasm of skin: Secondary | ICD-10-CM | POA: Diagnosis not present

## 2023-10-22 DIAGNOSIS — I10 Essential (primary) hypertension: Secondary | ICD-10-CM | POA: Diagnosis not present

## 2023-10-22 DIAGNOSIS — R7303 Prediabetes: Secondary | ICD-10-CM | POA: Insufficient documentation

## 2023-10-22 DIAGNOSIS — M19071 Primary osteoarthritis, right ankle and foot: Secondary | ICD-10-CM | POA: Diagnosis not present

## 2023-10-22 DIAGNOSIS — E790 Hyperuricemia without signs of inflammatory arthritis and tophaceous disease: Secondary | ICD-10-CM | POA: Insufficient documentation

## 2023-10-22 DIAGNOSIS — Z5181 Encounter for therapeutic drug level monitoring: Secondary | ICD-10-CM | POA: Diagnosis not present

## 2023-10-22 DIAGNOSIS — M1A09X0 Idiopathic chronic gout, multiple sites, without tophus (tophi): Secondary | ICD-10-CM | POA: Diagnosis not present

## 2023-10-22 DIAGNOSIS — M7061 Trochanteric bursitis, right hip: Secondary | ICD-10-CM | POA: Insufficient documentation

## 2023-10-22 DIAGNOSIS — M19041 Primary osteoarthritis, right hand: Secondary | ICD-10-CM | POA: Insufficient documentation

## 2023-10-22 DIAGNOSIS — M7062 Trochanteric bursitis, left hip: Secondary | ICD-10-CM | POA: Insufficient documentation

## 2023-10-22 DIAGNOSIS — M545 Low back pain, unspecified: Secondary | ICD-10-CM | POA: Insufficient documentation

## 2023-10-22 DIAGNOSIS — Z87898 Personal history of other specified conditions: Secondary | ICD-10-CM | POA: Insufficient documentation

## 2023-10-22 DIAGNOSIS — M4004 Postural kyphosis, thoracic region: Secondary | ICD-10-CM | POA: Diagnosis not present

## 2023-10-22 DIAGNOSIS — M19042 Primary osteoarthritis, left hand: Secondary | ICD-10-CM | POA: Diagnosis not present

## 2023-10-22 DIAGNOSIS — G8929 Other chronic pain: Secondary | ICD-10-CM | POA: Insufficient documentation

## 2023-10-22 DIAGNOSIS — M19072 Primary osteoarthritis, left ankle and foot: Secondary | ICD-10-CM | POA: Diagnosis not present

## 2023-10-22 DIAGNOSIS — Z860100 Personal history of colon polyps, unspecified: Secondary | ICD-10-CM | POA: Insufficient documentation

## 2023-10-22 DIAGNOSIS — M25551 Pain in right hip: Secondary | ICD-10-CM

## 2023-10-22 MED ORDER — COLCHICINE 0.6 MG PO TABS: 0.6000 mg | ORAL_TABLET | Freq: Every day | ORAL | 0 refills | Status: AC | PRN

## 2023-10-23 ENCOUNTER — Telehealth: Payer: Self-pay | Admitting: *Deleted

## 2023-10-23 ENCOUNTER — Telehealth: Payer: Self-pay

## 2023-10-23 ENCOUNTER — Encounter: Payer: Self-pay | Admitting: Family Medicine

## 2023-10-23 DIAGNOSIS — Z5181 Encounter for therapeutic drug level monitoring: Secondary | ICD-10-CM

## 2023-10-23 LAB — CBC WITH DIFFERENTIAL/PLATELET
Absolute Lymphocytes: 1444 {cells}/uL (ref 850–3900)
Absolute Monocytes: 957 {cells}/uL — ABNORMAL HIGH (ref 200–950)
Basophils Absolute: 52 {cells}/uL (ref 0–200)
Basophils Relative: 0.3 %
Eosinophils Absolute: 35 {cells}/uL (ref 15–500)
Eosinophils Relative: 0.2 %
HCT: 40.1 % (ref 35.0–45.0)
Hemoglobin: 13.5 g/dL (ref 11.7–15.5)
MCH: 32.1 pg (ref 27.0–33.0)
MCHC: 33.7 g/dL (ref 32.0–36.0)
MCV: 95.5 fL (ref 80.0–100.0)
MPV: 10.5 fL (ref 7.5–12.5)
Monocytes Relative: 5.5 %
Neutro Abs: 14912 {cells}/uL — ABNORMAL HIGH (ref 1500–7800)
Neutrophils Relative %: 85.7 %
Platelets: 298 10*3/uL (ref 140–400)
RBC: 4.2 10*6/uL (ref 3.80–5.10)
RDW: 13 % (ref 11.0–15.0)
Total Lymphocyte: 8.3 %
WBC: 17.4 10*3/uL — ABNORMAL HIGH (ref 3.8–10.8)

## 2023-10-23 LAB — COMPLETE METABOLIC PANEL WITH GFR
AG Ratio: 1.4 (calc) (ref 1.0–2.5)
ALT: 19 U/L (ref 6–29)
AST: 21 U/L (ref 10–35)
Albumin: 4.1 g/dL (ref 3.6–5.1)
Alkaline phosphatase (APISO): 70 U/L (ref 37–153)
BUN: 21 mg/dL (ref 7–25)
CO2: 25 mmol/L (ref 20–32)
Calcium: 9.6 mg/dL (ref 8.6–10.4)
Chloride: 100 mmol/L (ref 98–110)
Creat: 0.83 mg/dL (ref 0.60–1.00)
Globulin: 3 g/dL (ref 1.9–3.7)
Glucose, Bld: 114 mg/dL — ABNORMAL HIGH (ref 65–99)
Potassium: 4.2 mmol/L (ref 3.5–5.3)
Sodium: 136 mmol/L (ref 135–146)
Total Bilirubin: 0.8 mg/dL (ref 0.2–1.2)
Total Protein: 7.1 g/dL (ref 6.1–8.1)
eGFR: 72 mL/min/{1.73_m2} (ref >=60)

## 2023-10-23 LAB — URIC ACID: Uric Acid, Serum: 2.6 mg/dL (ref 2.5–7.0)

## 2023-10-23 NOTE — Telephone Encounter (Signed)
 Please follow up for a visit (need to re check blood pressure that was high at that visit also)  Will do exam/ re check lab and likely to urinalysis /possibly cxr, review family history/etc   Thanks

## 2023-10-23 NOTE — Telephone Encounter (Signed)
 Copied from CRM [id number]. Topic: Clinical - Request for Lab/Test Order >> Oct 23, 2023 12:33 PM Alphonzo Lemmings O wrote: Reason for CRM: patient calling cause she  had some routine blood work at the rheumatologist yesterday. My cBC came back elevated at 17.4. Everything else was normal. I don't feel sick or have any symptoms and don't have a fever. Do I need to have it rechecked in a while or just forget it if I don't get sick? I did aggravate the bursitis in my hip with some extra activity this weekend but don't know if that could be the cause. They just suggested to follow up with family medicine if I got symptoms. Patient is requesting for a lab for  differtial cbc  Patient received call from them this morning requesting that she get lab work for the cbc differtial  Please reach out to patient once lab order has been put in so she can go ahead and schedule the lab order appointment please  [phone]

## 2023-10-23 NOTE — Telephone Encounter (Signed)
 Pt said she hasn't been on any steroid, and no biologic/immune agents. Pt said she is only taking meds on med list

## 2023-10-23 NOTE — Telephone Encounter (Signed)
 Wbc elevated  Any recent steroids ? (Oral or injection) Is she on any biologic /immune agents for arthritis?

## 2023-10-23 NOTE — Progress Notes (Signed)
 Uric acid is in the desirable range, CMP is normal.  White cell count is elevated.  Please check if patient is taking prednisone or had recent steroid injection or infection.

## 2023-10-23 NOTE — Telephone Encounter (Signed)
 Please schedule appt

## 2023-10-23 NOTE — Progress Notes (Signed)
 Please have patient come in next week to repeat CBC with differential.

## 2023-10-23 NOTE — Telephone Encounter (Signed)
-----   Message from [facility] sent at 10/23/2023 12:21 PM EST ----- Please have patient come in next week to repeat CBC with differential.

## 2023-10-29 ENCOUNTER — Encounter: Payer: Self-pay | Admitting: Family Medicine

## 2023-10-29 ENCOUNTER — Ambulatory Visit: Payer: Medicare Other | Admitting: Family Medicine

## 2023-10-29 VITALS — BP 139/70 | HR 72 | Temp 97.8°F | Ht 60.0 in | Wt 139.2 lb

## 2023-10-29 DIAGNOSIS — R809 Proteinuria, unspecified: Secondary | ICD-10-CM | POA: Diagnosis not present

## 2023-10-29 DIAGNOSIS — D72829 Elevated white blood cell count, unspecified: Secondary | ICD-10-CM | POA: Diagnosis not present

## 2023-10-29 DIAGNOSIS — R040 Epistaxis: Secondary | ICD-10-CM | POA: Diagnosis not present

## 2023-10-29 DIAGNOSIS — I10 Essential (primary) hypertension: Secondary | ICD-10-CM

## 2023-10-29 LAB — CBC WITH DIFFERENTIAL/PLATELET
Basophils Absolute: 0.1 10*3/uL (ref 0.0–0.1)
Basophils Relative: 1 % (ref 0.0–3.0)
Eosinophils Absolute: 0.1 10*3/uL (ref 0.0–0.7)
Eosinophils Relative: 1.2 % (ref 0.0–5.0)
HCT: 41.9 % (ref 36.0–46.0)
Hemoglobin: 13.7 g/dL (ref 12.0–15.0)
Lymphocytes Relative: 26.3 % (ref 12.0–46.0)
Lymphs Abs: 2.3 10*3/uL (ref 0.7–4.0)
MCHC: 32.6 g/dL (ref 30.0–36.0)
MCV: 99 fl (ref 78.0–100.0)
Monocytes Absolute: 1 10*3/uL (ref 0.1–1.0)
Monocytes Relative: 11.6 % (ref 3.0–12.0)
Neutro Abs: 5.3 10*3/uL (ref 1.4–7.7)
Neutrophils Relative %: 59.9 % (ref 43.0–77.0)
Platelets: 322 10*3/uL (ref 150.0–400.0)
RBC: 4.23 Mil/uL (ref 3.87–5.11)
RDW: 15 % (ref 11.5–15.5)
WBC: 8.9 10*3/uL (ref 4.0–10.5)

## 2023-10-29 LAB — POC URINALSYSI DIPSTICK (AUTOMATED)
Bilirubin, UA: NEGATIVE
Blood, UA: NEGATIVE
Glucose, UA: NEGATIVE
Ketones, UA: NEGATIVE
Nitrite, UA: NEGATIVE
Protein, UA: POSITIVE — AB
Spec Grav, UA: 1.025 (ref 1.010–1.025)
Urobilinogen, UA: 0.2 U/dL
pH, UA: 6 (ref 5.0–8.0)

## 2023-10-29 NOTE — Assessment & Plan Note (Signed)
 New Will repeat urinalysis with lab /also micro when well hydrated

## 2023-10-29 NOTE — Addendum Note (Signed)
 Addended by: Roxy Manns A on: 10/29/2023 07:36 PM   Modules accepted: Orders

## 2023-10-29 NOTE — Patient Instructions (Signed)
 If nose bleed returns- hold pressure and if not stopped in 30 minutes call and go to ER if necessary   Be gentle with your nose   Tonight - use a little aquaphor in nostril  Use your humidifier   Keep watching blood pressure   Labs and urinalysis today

## 2023-10-29 NOTE — Assessment & Plan Note (Signed)
 Wbc of 17.4 - incidental at her rheum office  No illness Some physical and emotional stress No steroids  Pt feels fine Reviewed fam history  Urinalysis today  Repeat cbc today  If no improvement consider heme referral

## 2023-10-29 NOTE — Assessment & Plan Note (Signed)
 bp in fair control at this time  BP Readings from Last 1 Encounters:  10/29/23 139/70  Nosebleed today- raised this  At home avg 120s/60s Was up at rheum office  No changes needed Most recent labs reviewed  Continues losartan 100 mg daily and atenolol 25 mg daily

## 2023-10-29 NOTE — Progress Notes (Signed)
 Subjective:    Patient ID: Cindy [name], female    DOB: [birth year], 78 y.o.   MRN: [id number]  HPI  Wt Readings from Last 3 Encounters:  10/29/23 139 lb 4 oz (63.2 kg)  10/22/23 139 lb 12.8 oz (63.4 kg)  04/08/23 136 lb 4 oz (61.8 kg)   27.20 kg/m  Vitals:   10/29/23 1125 10/29/23 1149  BP: (!) 140/70 139/70  Pulse: 72   Temp: 97.8 F (36.6 C)   SpO2: 95%     Pt presents for follow up of HTN and chronic medical problems Also leukocytosis  Also nosebleed (left nostril) that started in the waiting room   Blood pressure was high at the rheum office   Had elevated wbc at rheum office  Lab Results  Component Value Date   WBC 8.9 10/29/2023   HGB 13.7 10/29/2023   HCT 41.9 10/29/2023   MCV 99.0 10/29/2023   PLT 322.0 10/29/2023   No recent infection  No recent steroids  No biologic medicines   Has gout  Colchicine and allopurinol   Nosebleed on left side Started on waiting room  Usually has a humidifier in bedroom      HTN bp is stable today  No cp or palpitations or headaches or edema  No side effects to medicines  BP Readings from Last 3 Encounters:  10/29/23 139/70  10/22/23 (!) 173/85  04/08/23 133/80    Losartan 100 mg daily  Atenolol 25 mg daily   Pretty good blood pressure at home 120s/60s      Pulse Readings from Last 3 Encounters:  10/29/23 72  10/22/23 81  04/08/23 67     Lab Results  Component Value Date   NA 136 10/22/2023   K 4.2 10/22/2023   CO2 25 10/22/2023   GLUCOSE 114 (H) 10/22/2023   BUN 21 10/22/2023   CREATININE 0.83 10/22/2023   CALCIUM 9.6 10/22/2023   GFR 62.77 11/22/2019   EGFR 72 10/22/2023   GFRNONAA 65 08/30/2020   Lab Results  Component Value Date   ALT 19 10/22/2023   AST 21 10/22/2023   ALKPHOS 70 03/29/2019   BILITOT 0.8 10/22/2023    Lab Results  Component Value Date   WBC 8.9 10/29/2023   HGB 13.7 10/29/2023   HCT 41.9 10/29/2023   MCV 99.0 10/29/2023   PLT 322.0 10/29/2023    Neutrophilia (abs)  Monocytes up a bit as well   Woke up with a headache the day after blood draw Is better now    She was exp to influenza A the weekend before    She did have some physical stress that week (aggrivated her hip squatting to cut her mother's toe nails)  Takes care of her 54 year old mother  No urinary symptoms       Lab Results  Component Value Date   TSH 4.65 04/08/2023   Lab Results  Component Value Date   HGBA1C 6.1 04/08/2023    UA today   Latest Reference Range & Units 10/29/23 12:54  Bilirubin, UA  Negative  Clarity, UA  Hazy  Color, UA  Yellow  Glucose Negative  Negative  Ketones, UA  Negative  Leukocytes,UA Negative  Trace !  Nitrite, UA  Negative  pH, UA 5.0 - 8.0  6.0  Protein,UA Negative  Positive !  Specific Gravity, UA 1.010 - 1.025  1.025  Urobilinogen, UA 0.2 or 1.0 E.U./dL 0.2  RBC, UA  Negative  !: Data is  abnormal    Patient Active Problem List   Diagnosis Date Noted   Leukocytosis 10/29/2023   Epistaxis 10/29/2023   Mitral valve insufficiency 12/26/2021   Postmenopausal status 12/26/2021   Gout 02/02/2019   Colon cancer screening 03/11/2017   Routine general medical examination at a health care facility 02/28/2015   Encounter for Medicare annual wellness exam 02/03/2013   Osteopenia 02/03/2012   Prediabetes 10/10/2010   COLONIC POLYPS 03/05/2010   Hyperlipidemia 07/27/2009   Essential hypertension 07/27/2009   BASAL CELL CARCINOMA, HX OF 07/27/2009   Past Medical History:  Diagnosis Date   Benign neoplasm of colon    Gout    MVP (mitral valve prolapse)    very mild   Other abnormal glucose    Other and unspecified hyperlipidemia    Personal history of other malignant neoplasm of skin    Unspecified essential hypertension    Past Surgical History:  Procedure Laterality Date   CARDIAC CATHETERIZATION  1995   normal (after abnormal stress test)   COLONOSCOPY  07/01/03   polyps   MOHS SURGERY  11/09    for basal cell lesion   Social History   Tobacco Use   Smoking status: Never    Passive exposure: Past   Smokeless tobacco: Never  Vaping Use   Vaping status: Never Used  Substance Use Topics   Alcohol use: Yes    Alcohol/week: 0.0 standard drinks of alcohol    Comment: occasional   Drug use: No   Family History  Problem Relation Age of Onset   Macular degeneration Mother    Sudden death Father 68   Heart disease Brother    Gout Brother    Arthritis Brother    Physiological scientist cancer Neg Hx    Esophageal cancer Neg Hx    Rectal cancer Neg Hx    Stomach cancer Neg Hx    Allergies  Allergen Reactions   Ampicillin     REACTION: rash   Calcium     REACTION: GI intolerance   Cephalexin     REACTION: rash   Colchicine     Diarrhea   Current Outpatient Medications on File Prior to Visit  Medication Sig Dispense Refill   allopurinol (ZYLOPRIM) 100 MG tablet TAKE 2 TABLETS(200 MG) BY MOUTH DAILY 180 tablet 0   atenolol (TENORMIN) 25 MG tablet TAKE 1 TABLET(25 MG) BY MOUTH DAILY 90 tablet 2   atorvastatin (LIPITOR) 20 MG tablet TAKE 1 TABLET(20 MG) BY MOUTH DAILY 90 tablet 3   Cholecalciferol (VITAMIN D-3) 1000 units CAPS Take 1 capsule by mouth daily.     colchicine 0.6 MG tablet Take 1 tablet (0.6 mg total) by mouth daily as needed. 90 tablet 0   Loperamide HCl (IMODIUM A-D PO) Take by mouth as needed.     losartan (COZAAR) 100 MG tablet TAKE 1 TABLET(100 MG) BY MOUTH DAILY 90 tablet 3   triamcinolone cream (KENALOG) 0.1 % Apply 1 application topically 2 (two) times daily as needed.  2   No current facility-administered medications on file prior to visit.    Review of Systems  Constitutional:  Negative for activity change, appetite change, fatigue, fever and unexpected weight change.  HENT:  Negative for congestion, ear pain, rhinorrhea, sinus pressure and sore throat.        Nosebleed left nostril  Eyes:  Negative for pain, redness and visual disturbance.   Respiratory:  Negative for cough, shortness of breath and wheezing.  Cardiovascular:  Negative for chest pain and palpitations.  Gastrointestinal:  Negative for abdominal pain, blood in stool, constipation and diarrhea.  Endocrine: Negative for polydipsia and polyuria.  Genitourinary:  Negative for dysuria, frequency and urgency.  Musculoskeletal:  Positive for arthralgias. Negative for back pain and myalgias.  Skin:  Negative for pallor and rash.  Allergic/Immunologic: Negative for environmental allergies.  Neurological:  Negative for dizziness, syncope, facial asymmetry and headaches.       Had one headache Gone now   Hematological:  Negative for adenopathy. Does not bruise/bleed easily.  Psychiatric/Behavioral:  Negative for decreased concentration and dysphoric mood. The patient is not nervous/anxious.        Objective:   Physical Exam Constitutional:      General: She is not in acute distress.    Appearance: Normal appearance. She is well-developed and normal weight. She is not ill-appearing or diaphoretic.  HENT:     Head: Normocephalic and atraumatic.     Right Ear: Tympanic membrane and ear canal normal.     Left Ear: Tympanic membrane and ear canal normal.     Nose:     Comments: Scab on left septum Actively bleeding- stops after 15 minutes of pressure /pinching nose      Mouth/Throat:     Mouth: Mucous membranes are moist.     Pharynx: Oropharynx is clear.  Eyes:     Conjunctiva/sclera: Conjunctivae normal.     Pupils: Pupils are equal, round, and reactive to light.  Neck:     Thyroid: No thyromegaly.     Vascular: No carotid bruit or JVD.  Cardiovascular:     Rate and Rhythm: Normal rate and regular rhythm.     Heart sounds: Normal heart sounds.     No gallop.  Pulmonary:     Effort: Pulmonary effort is normal. No respiratory distress.     Breath sounds: Normal breath sounds. No stridor. No wheezing, rhonchi or rales.  Abdominal:     General: There is no  distension or abdominal bruit.     Palpations: Abdomen is soft. There is no mass.     Tenderness: There is no abdominal tenderness.  Musculoskeletal:     Cervical back: Normal range of motion and neck supple.     Right lower leg: No edema.     Left lower leg: No edema.  Lymphadenopathy:     Cervical: No cervical adenopathy.  Skin:    General: Skin is warm and dry.     Coloration: Skin is not pale.     Findings: No rash.  Neurological:     Mental Status: She is alert.     Coordination: Coordination normal.     Deep Tendon Reflexes: Reflexes are normal and symmetric. Reflexes normal.  Psychiatric:        Mood and Affect: Mood normal.           Assessment & Plan:   Problem List Items Addressed This Visit       Cardiovascular and Mediastinum   Essential hypertension - Primary   bp in fair control at this time  BP Readings from Last 1 Encounters:  10/29/23 139/70  Nosebleed today- raised this  At home avg 120s/60s Was up at rheum office  No changes needed Most recent labs reviewed  Continues losartan 100 mg daily and atenolol 25 mg daily          Other   Leukocytosis   Wbc of 17.4 - incidental at her rheum  office  No illness Some physical and emotional stress No steroids  Pt feels fine Reviewed fam history  Urinalysis today  Repeat cbc today  If no improvement consider heme referral        Relevant Orders   CBC with Differential/Platelet (Completed)   POCT Urinalysis Dipstick (Automated) (Completed)   Epistaxis   Spontaneous left nostril bleed started in waiting room  Pt has these periodically , always on left and last one a year ago  With 15 min of pressure resolved Scab on left septum See AVS for instructions - re: pressure/ use of moisture and aquaphor  Will clean humidifier and get it started in bedroom Call back and Er precautions noted in detail today   No blood thinners or asa

## 2023-10-29 NOTE — Assessment & Plan Note (Addendum)
 Spontaneous left nostril bleed started in waiting room  Pt has these periodically , always on left and last one a year ago  With 15 min of pressure resolved Scab on left septum See AVS for instructions - re: pressure/ use of moisture and aquaphor  Will clean humidifier and get it started in bedroom Call back and Er precautions noted in detail today   No blood thinners or asa

## 2023-12-02 ENCOUNTER — Other Ambulatory Visit: Payer: Self-pay | Admitting: Rheumatology

## 2023-12-02 NOTE — Telephone Encounter (Signed)
 Last Fill: 09/01/2023  Labs: 10/22/2023 Uric acid is in the desirable range, CMP is normal.  White cell count is elevated.   Next Visit: 05/17/2024  Last Visit: 10/22/2023  DX:  Idiopathic chronic gout of multiple sites without tophus   Current Dose per office note 10/22/2023: Allopurinol 100 mg 2 tablets by mouth daily   Okay to refill Allopurinol?

## 2024-01-27 ENCOUNTER — Other Ambulatory Visit: Payer: Self-pay | Admitting: Family Medicine

## 2024-01-27 DIAGNOSIS — Z1231 Encounter for screening mammogram for malignant neoplasm of breast: Secondary | ICD-10-CM

## 2024-02-05 ENCOUNTER — Encounter

## 2024-02-20 DIAGNOSIS — H25813 Combined forms of age-related cataract, bilateral: Secondary | ICD-10-CM | POA: Diagnosis not present

## 2024-02-20 DIAGNOSIS — H40053 Ocular hypertension, bilateral: Secondary | ICD-10-CM | POA: Diagnosis not present

## 2024-02-20 DIAGNOSIS — H35361 Drusen (degenerative) of macula, right eye: Secondary | ICD-10-CM | POA: Diagnosis not present

## 2024-03-02 ENCOUNTER — Ambulatory Visit (INDEPENDENT_AMBULATORY_CARE_PROVIDER_SITE_OTHER)

## 2024-03-02 VITALS — BP 139/70 | Ht 60.0 in | Wt 134.0 lb

## 2024-03-02 DIAGNOSIS — Z0000 Encounter for general adult medical examination without abnormal findings: Secondary | ICD-10-CM

## 2024-03-02 NOTE — Progress Notes (Signed)
 Because this visit was a virtual/telehealth visit,  certain criteria was not obtained, such a blood pressure, CBG if applicable, and timed get up and go. Any medications not marked as taking were not mentioned during the medication reconciliation part of the visit. Any vitals not documented were not able to be obtained due to this being a telehealth visit or patient was unable to self-report a recent blood pressure reading due to a lack of equipment at home via telehealth. Vitals that have been documented are verbally provided by the patient.  This visit was performed by a medical professional under my direct supervision. I was immediately available for consultation/collaboration. I have reviewed and agree with the Annual Wellness Visit documentation.  Subjective:   Cindy [name] is a 78 y.o. who presents for a Medicare Wellness preventive visit.  As a reminder, Annual Wellness Visits don't include a physical exam, and some assessments may be limited, especially if this visit is performed virtually. We may recommend an in-person follow-up visit with your provider if needed.  Visit Complete: Virtual I connected with  Cindy [name] on 03/02/24 by a audio enabled telemedicine application and verified that I am speaking with the correct person using two identifiers.  Patient Location: Home  Provider Location: Home Office  I discussed the limitations of evaluation and management by telemedicine. The patient expressed understanding and agreed to proceed.  Vital Signs: Because this visit was a virtual/telehealth visit, some criteria may be missing or patient reported. Any vitals not documented were not able to be obtained and vitals that have been documented are patient reported.  VideoDeclined- This patient declined Librarian, academic. Therefore the visit was completed with audio only.  Persons Participating in Visit: Patient.  AWV Questionnaire: No: Patient Medicare  AWV questionnaire was not completed prior to this visit.  Cardiac Risk Factors include: advanced age (>38men, >23 women);hypertension     Objective:    Today's Vitals   03/02/24 0857  BP: 139/70  Weight: 134 lb (60.8 kg)  Height: 5' (1.524 m)   Body mass index is 26.17 kg/m.     03/02/2024    8:56 AM 01/01/2023    8:38 AM 12/26/2021    8:57 AM 03/31/2020    9:50 AM 03/16/2018   11:18 AM 07/04/2017    8:55 AM 03/11/2017    9:44 AM  Advanced Directives  Does Patient Have a Medical Advance Directive? Yes Yes Yes Yes Yes  Yes  Yes   Type of Estate agent of [place];Living will Healthcare Power of [place];Living will Healthcare Power of [place];Living will Healthcare Power of [place];Living will Healthcare Power of [place];Living will Healthcare Power of [place];Living will Healthcare Power of [place];Living will  Does patient want to make changes to medical advance directive? No - Patient declined        Copy of Healthcare Power of Attorney in Chart? No - copy requested No - copy requested Yes - validated most recent copy scanned in chart (See row information) No - copy requested No - copy requested   No - copy requested      Data saved with a previous flowsheet row definition    Current Medications (verified) Outpatient Encounter Medications as of 03/02/2024  Medication Sig   allopurinol  (ZYLOPRIM ) 100 MG tablet TAKE 2 TABLETS(200 MG) BY MOUTH DAILY   atenolol  (TENORMIN ) 25 MG tablet TAKE 1 TABLET(25 MG) BY MOUTH DAILY   atorvastatin  (LIPITOR) 20 MG tablet TAKE 1 TABLET(20 MG) BY MOUTH DAILY  Cholecalciferol (VITAMIN D -3) 1000 units CAPS Take 1 capsule by mouth daily.   colchicine  0.6 MG tablet Take 1 tablet (0.6 mg total) by mouth daily as needed.   Loperamide HCl (IMODIUM A-D PO) Take by mouth as needed.   losartan  (COZAAR ) 100 MG tablet TAKE 1 TABLET(100 MG) BY MOUTH DAILY   triamcinolone  cream (KENALOG ) 0.1 % Apply 1 application topically 2 (two) times  daily as needed.   No facility-administered encounter medications on file as of 03/02/2024.    Allergies (verified) Ampicillin, Calcium , Cephalexin, and Colchicine    History: Past Medical History:  Diagnosis Date   Benign neoplasm of colon    Gout    MVP (mitral valve prolapse)    very mild   Other abnormal glucose    Other and unspecified hyperlipidemia    Personal history of other malignant neoplasm of skin    Unspecified essential hypertension    Past Surgical History:  Procedure Laterality Date   CARDIAC CATHETERIZATION  1995   normal (after abnormal stress test)   COLONOSCOPY  07/01/03   polyps   MOHS SURGERY  11/09   for basal cell lesion   Family History  Problem Relation Age of Onset   Macular degeneration Mother    Sudden death Father 67   Heart disease Brother    Gout Brother    Arthritis Brother    Healthy Daughter    Colon cancer Neg Hx    Esophageal cancer Neg Hx    Rectal cancer Neg Hx    Stomach cancer Neg Hx    Social History   Socioeconomic History   Marital status: Widowed    Spouse name: Not on file   Number of children: 1   Years of education: Not on file   Highest education level: Not on file  Occupational History   Occupation: retired  Tobacco Use   Smoking status: Never    Passive exposure: Past   Smokeless tobacco: Never  Vaping Use   Vaping status: Never Used  Substance and Sexual Activity   Alcohol use: Yes    Alcohol/week: 0.0 standard drinks of alcohol    Comment: occasional   Drug use: No   Sexual activity: Never  Other Topics Concern   Not on file  Social History Narrative   Retired-med tech   Husband had advanced parkinson's and dementia, passed away in 2013/03/11.   No regular exercise   Social Drivers of Health   Financial Resource Strain: Low Risk  (03/02/2024)   Overall Financial Resource Strain (CARDIA)    Difficulty of Paying Living Expenses: Not hard at all  Food Insecurity: No Food Insecurity (03/02/2024)   Hunger  Vital Sign    Worried About Running Out of Food in the Last Year: Never true    Ran Out of Food in the Last Year: Never true  Transportation Needs: No Transportation Needs (03/02/2024)   PRAPARE - Administrator, Civil Service (Medical): No    Lack of Transportation (Non-Medical): No  Physical Activity: Insufficiently Active (03/02/2024)   Exercise Vital Sign    Days of Exercise per Week: 3 days    Minutes of Exercise per Session: 30 min  Stress: No Stress Concern Present (03/02/2024)   [employer] of Occupational Health - Occupational Stress Questionnaire    Feeling of Stress: Not at all  Social Connections: Socially Isolated (03/02/2024)   Social Connection and Isolation Panel    Frequency of Communication with Friends and Family: More than  three times a week    Frequency of Social Gatherings with Friends and Family: More than three times a week    Attends Religious Services: Never    Database administrator or Organizations: No    Attends Banker Meetings: Never    Marital Status: Widowed    Tobacco Counseling Counseling given: Not Answered    Clinical Intake:  Pre-visit preparation completed: Yes  Pain : No/denies pain     BMI - recorded: 26.17 Nutritional Status: BMI 25 -29 Overweight Nutritional Risks: None Diabetes: No  Lab Results  Component Value Date   HGBA1C 6.1 04/08/2023   HGBA1C 6.3 04/05/2022   HGBA1C 6.4 04/04/2021     How often do you need to have someone help you when you read instructions, pamphlets, or other written materials from your doctor or pharmacy?: 1 - Never What is the last grade level you completed in school?: 4 years of college  Interpreter Needed?: No  Information entered by :: Breelle Hollywood,cma   Activities of Daily Living     03/02/2024    9:00 AM  In your present state of health, do you have any difficulty performing the following activities:  Hearing? 0  Vision? 0  Difficulty concentrating or  making decisions? 0  Walking or climbing stairs? 0  Dressing or bathing? 0  Doing errands, shopping? 0  Preparing Food and eating ? N  Using the Toilet? N  In the past six months, have you accidently leaked urine? N  Do you have problems with loss of bowel control? N  Managing your Medications? N  Managing your Finances? N  Housekeeping or managing your Housekeeping? N    Patient Care Team: Tower, Laine LABOR, MD as PCP - General Estelle Fallow as Referring Physician (Optometry) Ivin Kocher, MD as Consulting Physician (Dermatology)  I have updated your Care Teams any recent Medical Services you may have received from other providers in the past year.     Assessment:   This is a routine wellness examination for Cindy [name].  Hearing/Vision screen Hearing Screening - Comments:: No difficulties Vision Screening - Comments:: Pt wears glasses    Goals Addressed               This Visit's Progress     Increase physical activity (pt-stated)   On track     Starting 03/11/2017, I will resume exercise as tolerated for at least 30 min 2-3 days per week.        Depression Screen     03/02/2024    9:02 AM 10/29/2023   12:06 PM 04/08/2023    9:59 AM 01/01/2023    8:37 AM 04/05/2022   10:23 AM 12/26/2021    8:53 AM 04/04/2021    9:35 AM  PHQ 2/9 Scores  PHQ - 2 Score 0 0 0 0 0 0 0  PHQ- 9 Score 0 0 0    0    Fall Risk     03/02/2024    8:59 AM 10/29/2023   12:06 PM 04/08/2023    9:59 AM 01/01/2023    8:39 AM 04/05/2022   10:23 AM  Fall Risk   Falls in the past year? 0 0 0 0 0  Number falls in past yr: 0 0 0 0   Injury with Fall? 0 0 0 0   Risk for fall due to : No Fall Risks No Fall Risks No Fall Risks No Fall Risks   Follow up Falls evaluation  completed Falls evaluation completed Falls evaluation completed Falls prevention discussed;Falls evaluation completed;Education provided Falls evaluation completed      Data saved with a previous flowsheet row definition    MEDICARE  RISK AT HOME:  Medicare Risk at Home Any stairs in or around the home?: No If so, are there any without handrails?: No Home free of loose throw rugs in walkways, pet beds, electrical cords, etc?: Yes Adequate lighting in your home to reduce risk of falls?: Yes Life alert?: No Use of a cane, walker or w/c?: No Grab bars in the bathroom?: Yes Shower chair or bench in shower?: Yes Elevated toilet seat or a handicapped toilet?: Yes  TIMED UP AND GO:  Was the test performed?  No  Cognitive Function: 6CIT completed    03/31/2020    9:56 AM 03/16/2018   11:18 AM 03/11/2017    9:46 AM 03/07/2016    2:25 PM  MMSE - Mini Mental State Exam  Orientation to time 5 5 5  5    Orientation to Place 5 5 5  5    Registration 3 3 3  3    Attention/ Calculation 5 0 0  0   Recall 3 3 3  3    Language- name 2 objects  0 0  0   Language- repeat 1 1 1 1   Language- follow 3 step command  3 3  3    Language- read & follow direction  0 0  0   Write a sentence  0 0  0   Copy design  0 0  0   Total score  20 20  20       Data saved with a previous flowsheet row definition        03/02/2024    8:58 AM 01/01/2023    8:40 AM 12/26/2021    8:59 AM  6CIT Screen  What Year? 0 points 0 points 0 points  What month? 0 points 0 points 0 points  What time? 0 points 0 points 0 points  Count back from 20 0 points 0 points 0 points  Months in reverse 0 points 0 points 0 points  Repeat phrase 0 points 0 points 0 points  Total Score 0 points 0 points 0 points    Immunizations Immunization History  Administered Date(s) Administered   Fluad Quad(high Dose 65+) 05/25/2019   Hepatitis B 07/03/1988, 07/30/1988, 01/01/1989   Influenza Split 06/04/2011   Influenza Whole 04/26/2009, 05/26/2009, 05/28/2010, 06/02/2012   Influenza, High Dose Seasonal PF 06/17/2018   Influenza,inj,Quad PF,6+ Mos 06/18/2017   Influenza-Unspecified 06/01/2013, 05/27/2014, 05/26/2018   PFIZER Comirnaty(Gray Top)Covid-19 Tri-Sucrose Vaccine  12/01/2020   PFIZER(Purple Top)SARS-COV-2 Vaccination 09/05/2019, 09/26/2019, 05/30/2020   Pfizer Covid-19 Vaccine Bivalent Booster 79yrs & up 05/25/2021   Pneumococcal Conjugate-13 01/12/2015   Pneumococcal Polysaccharide-23 10/24/2010   Td 08/26/2004   Zoster Recombinant(Shingrix) 09/20/2021, 12/04/2021   Zoster, Live 05/07/2013    Screening Tests Health Maintenance  Topic Date Due   FOOT EXAM  Never done   DEXA SCAN  Never done   Diabetic kidney evaluation - Urine ACR  04/21/2011   DTaP/Tdap/Td (2 - Tdap) 08/26/2014   Colonoscopy  07/04/2022   OPHTHALMOLOGY EXAM  10/16/2022   COVID-19 Vaccine (6 - 2024-25 season) 04/27/2023   HEMOGLOBIN A1C  10/09/2023   MAMMOGRAM  02/14/2024   INFLUENZA VACCINE  03/26/2024   Diabetic kidney evaluation - eGFR measurement  10/21/2024   Medicare Annual Wellness (AWV)  03/02/2025   Pneumococcal Vaccine: 50+ Years  Completed   Hepatitis B Vaccines  Completed   Hepatitis C Screening  Completed   Zoster Vaccines- Shingrix  Completed   HPV VACCINES  Aged Out   Meningococcal B Vaccine  Aged Out    Health Maintenance  Health Maintenance Due  Topic Date Due   FOOT EXAM  Never done   DEXA SCAN  Never done   Diabetic kidney evaluation - Urine ACR  04/21/2011   DTaP/Tdap/Td (2 - Tdap) 08/26/2014   Colonoscopy  07/04/2022   OPHTHALMOLOGY EXAM  10/16/2022   COVID-19 Vaccine (6 - 2024-25 season) 04/27/2023   HEMOGLOBIN A1C  10/09/2023   MAMMOGRAM  02/14/2024   Health Maintenance Items Addressed: Patient will discuss later   Additional Screening:  Vision Screening: Recommended annual ophthalmology exams for early detection of glaucoma and other disorders of the eye. Would you like a referral to an eye doctor? No    Dental Screening: Recommended annual dental exams for proper oral hygiene  Community Resource Referral / Chronic Care Management: CRR required this visit?  No   CCM required this visit?  No   Plan:    I have personally  reviewed and noted the following in the patient's chart:   Medical and social history Use of alcohol, tobacco or illicit drugs  Current medications and supplements including opioid prescriptions. Patient is not currently taking opioid prescriptions. Functional ability and status Nutritional status Physical activity Advanced directives List of other physicians Hospitalizations, surgeries, and ER visits in previous 12 months Vitals Screenings to include cognitive, depression, and falls Referrals and appointments  In addition, I have reviewed and discussed with patient certain preventive protocols, quality metrics, and best practice recommendations. A written personalized care plan for preventive services as well as general preventive health recommendations were provided to patient.   Lyle MARLA Right, [place]   03/02/2024   After Visit Summary: (MyChart) Due to this being a telephonic visit, the after visit summary with patients personalized plan was offered to patient via MyChart   Notes: Nothing significant to report at this time.

## 2024-03-02 NOTE — Patient Instructions (Signed)
 Cindy [name] , Thank you for taking time out of your busy schedule to complete your Annual Wellness Visit with me. I enjoyed our conversation and look forward to speaking with you again next year. I, as well as your care team,  appreciate your ongoing commitment to your health goals. Please review the following plan we discussed and let me know if I can assist you in the future. Your Game plan/ To Do List    Referrals: If you haven't heard from the office you've been referred to, please reach out to them at the phone provided.  none Follow up Visits: Next Medicare AWV with our clinical staff: 03/03/2024   Have you seen your provider in the last 6 months (3 months if uncontrolled diabetes)? No Next Office Visit with your provider: 04/27/2024  Clinician Recommendations:  Aim for 30 minutes of exercise or brisk walking, 6-8 glasses of water, and 5 servings of fruits and vegetables each day.       This is a list of the screening recommended for you and due dates:  Health Maintenance  Topic Date Due   Complete foot exam   Never done   DEXA scan (bone density measurement)  Never done   Yearly kidney health urinalysis for diabetes  04/21/2011   DTaP/Tdap/Td vaccine (2 - Tdap) 08/26/2014   Colon Cancer Screening  07/04/2022   Eye exam for diabetics  10/16/2022   COVID-19 Vaccine (6 - 2024-25 season) 04/27/2023   Hemoglobin A1C  10/09/2023   Mammogram  02/14/2024   Flu Shot  03/26/2024   Yearly kidney function blood test for diabetes  10/21/2024   Medicare Annual Wellness Visit  03/02/2025   Pneumococcal Vaccine for age over 61  Completed   Hepatitis B Vaccine  Completed   Hepatitis C Screening  Completed   Zoster (Shingles) Vaccine  Completed   HPV Vaccine  Aged Out   Meningitis B Vaccine  Aged Out    Advanced directives: (Copy Requested) Please bring a copy of your health care power of attorney and living will to the office to be added to your chart at your convenience. You can mail to  [facility] [address] [place], [place] [address] or email to [email or web address] Advance Care Planning is important because it:  [x]  Makes sure you receive the medical care that is consistent with your values, goals, and preferences  [x]  It provides guidance to your family and loved ones and reduces their decisional burden about whether or not they are making the right decisions based on your wishes.  Follow the link provided in your after visit summary or read over the paperwork we have mailed to you to help you started getting your Advance Directives in place. If you need assistance in completing these, please reach out to us  so that we can help you!  See attachments for Preventive Care and Fall Prevention Tips.

## 2024-03-09 ENCOUNTER — Ambulatory Visit
Admission: RE | Admit: 2024-03-09 | Discharge: 2024-03-09 | Disposition: A | Discharge status: Home / Self Care | Source: Ambulatory Visit | Attending: Family Medicine | Admitting: Family Medicine

## 2024-03-09 DIAGNOSIS — Z1231 Encounter for screening mammogram for malignant neoplasm of breast: Secondary | ICD-10-CM

## 2024-03-14 ENCOUNTER — Ambulatory Visit: Payer: Self-pay | Admitting: Family Medicine

## 2024-03-15 ENCOUNTER — Other Ambulatory Visit: Payer: Self-pay | Admitting: Rheumatology

## 2024-03-15 DIAGNOSIS — E790 Hyperuricemia without signs of inflammatory arthritis and tophaceous disease: Secondary | ICD-10-CM

## 2024-03-15 DIAGNOSIS — H40053 Ocular hypertension, bilateral: Secondary | ICD-10-CM | POA: Diagnosis not present

## 2024-03-15 DIAGNOSIS — Z5181 Encounter for therapeutic drug level monitoring: Secondary | ICD-10-CM

## 2024-03-15 DIAGNOSIS — M1A09X0 Idiopathic chronic gout, multiple sites, without tophus (tophi): Secondary | ICD-10-CM

## 2024-03-15 NOTE — Telephone Encounter (Addendum)
 Last Fill: 12/02/2023  Labs: 10/22/2023 Uric acid is in the desirable range, CMP is normal.  White cell count is elevated   Next Visit: 05/17/2024  Last Visit: 10/22/2023  DX: Idiopathic chronic gout of multiple sites without tophus   Current Dose per office note 10/22/2023: Allopurinol  100 mg 2 tablets by mouth daily   Okay to refill Allopurinol ?

## 2024-03-24 ENCOUNTER — Other Ambulatory Visit: Payer: Self-pay | Admitting: Family Medicine

## 2024-04-05 DIAGNOSIS — H40053 Ocular hypertension, bilateral: Secondary | ICD-10-CM | POA: Diagnosis not present

## 2024-04-27 ENCOUNTER — Ambulatory Visit (INDEPENDENT_AMBULATORY_CARE_PROVIDER_SITE_OTHER): Payer: Medicare Other | Admitting: Family Medicine

## 2024-04-27 ENCOUNTER — Encounter: Payer: Self-pay | Admitting: Family Medicine

## 2024-04-27 ENCOUNTER — Ambulatory Visit: Payer: Self-pay | Admitting: Family Medicine

## 2024-04-27 VITALS — BP 133/80 | HR 68 | Temp 97.9°F | Ht 58.5 in | Wt 134.5 lb

## 2024-04-27 DIAGNOSIS — I10 Essential (primary) hypertension: Secondary | ICD-10-CM | POA: Diagnosis not present

## 2024-04-27 DIAGNOSIS — M10079 Idiopathic gout, unspecified ankle and foot: Secondary | ICD-10-CM | POA: Diagnosis not present

## 2024-04-27 DIAGNOSIS — M8580 Other specified disorders of bone density and structure, unspecified site: Secondary | ICD-10-CM | POA: Diagnosis not present

## 2024-04-27 DIAGNOSIS — R7303 Prediabetes: Secondary | ICD-10-CM

## 2024-04-27 DIAGNOSIS — D126 Benign neoplasm of colon, unspecified: Secondary | ICD-10-CM | POA: Diagnosis not present

## 2024-04-27 DIAGNOSIS — E7800 Pure hypercholesterolemia, unspecified: Secondary | ICD-10-CM | POA: Diagnosis not present

## 2024-04-27 LAB — CBC WITH DIFFERENTIAL/PLATELET
Basophils Absolute: 0.1 K/uL (ref 0.0–0.1)
Basophils Relative: 0.9 % (ref 0.0–3.0)
Eosinophils Absolute: 0.1 K/uL (ref 0.0–0.7)
Eosinophils Relative: 1.3 % (ref 0.0–5.0)
HCT: 42.1 % (ref 36.0–46.0)
Hemoglobin: 13.9 g/dL (ref 12.0–15.0)
Lymphocytes Relative: 25.4 % (ref 12.0–46.0)
Lymphs Abs: 1.5 K/uL (ref 0.7–4.0)
MCHC: 33 g/dL (ref 30.0–36.0)
MCV: 96.9 fl (ref 78.0–100.0)
Monocytes Absolute: 0.6 K/uL (ref 0.1–1.0)
Monocytes Relative: 10.9 % (ref 3.0–12.0)
Neutro Abs: 3.6 K/uL (ref 1.4–7.7)
Neutrophils Relative %: 61.5 % (ref 43.0–77.0)
Platelets: 256 K/uL (ref 150.0–400.0)
RBC: 4.34 Mil/uL (ref 3.87–5.11)
RDW: 15.4 % (ref 11.5–15.5)
WBC: 5.8 K/uL (ref 4.0–10.5)

## 2024-04-27 LAB — HEMOGLOBIN A1C: Hgb A1c MFr Bld: 6.5 % (ref 4.6–6.5)

## 2024-04-27 LAB — LIPID PANEL
Cholesterol: 193 mg/dL (ref 0–200)
HDL: 72.5 mg/dL (ref >39.00)
LDL Cholesterol: 89 mg/dL (ref 0–99)
NonHDL: 120.87
Total CHOL/HDL Ratio: 3
Triglycerides: 157 mg/dL — ABNORMAL HIGH (ref 0.0–149.0)
VLDL: 31.4 mg/dL (ref 0.0–40.0)

## 2024-04-27 LAB — COMPREHENSIVE METABOLIC PANEL WITH GFR
ALT: 22 U/L (ref 0–35)
AST: 25 U/L (ref 0–37)
Albumin: 4.3 g/dL (ref 3.5–5.2)
Alkaline Phosphatase: 74 U/L (ref 39–117)
BUN: 16 mg/dL (ref 6–23)
CO2: 27 meq/L (ref 19–32)
Calcium: 9.6 mg/dL (ref 8.4–10.5)
Chloride: 100 meq/L (ref 96–112)
Creatinine, Ser: 0.74 mg/dL (ref 0.40–1.20)
GFR: 77.34 mL/min (ref >60.00)
Glucose, Bld: 108 mg/dL — ABNORMAL HIGH (ref 70–99)
Potassium: 3.9 meq/L (ref 3.5–5.1)
Sodium: 138 meq/L (ref 135–145)
Total Bilirubin: 1 mg/dL (ref 0.2–1.2)
Total Protein: 7.3 g/dL (ref 6.0–8.3)

## 2024-04-27 LAB — TSH: TSH: 4.34 u[IU]/mL (ref 0.35–5.50)

## 2024-04-27 NOTE — Assessment & Plan Note (Signed)
 Declines another dexa (will let us  know if she changes her mind) No falls or fractures Takes D3   Encouraged strongly to start more exercise/ strength training

## 2024-04-27 NOTE — Assessment & Plan Note (Signed)
 Declines further colonoscopies No stool changes

## 2024-04-27 NOTE — Progress Notes (Signed)
 Subjective:    Patient ID: Cindy [name], female    DOB: 1946-07-29, 78 y.o.   MRN: [id number]  HPI  Here for annual follow up of chronic medical problems   Wt Readings from Last 3 Encounters:  04/27/24 134 lb 8 oz (61 kg)  03/02/24 134 lb (60.8 kg)  10/29/23 139 lb 4 oz (63.2 kg)   27.63 kg/m  Vitals:   04/27/24 1000 04/27/24 1018  BP: (!) 160/80 133/80  Pulse: 68   Temp: 97.9 F (36.6 C)   SpO2: 96%     Immunization History  Administered Date(s) Administered   Fluad Quad(high Dose 65+) 05/25/2019   Hepatitis B 07/03/1988, 07/30/1988, 01/01/1989   INFLUENZA, HIGH DOSE SEASONAL PF 06/17/2018   Influenza Split 06/04/2011   Influenza Whole 04/26/2009, 05/26/2009, 05/28/2010, 06/02/2012   Influenza,inj,Quad PF,6+ Mos 06/18/2017   Influenza-Unspecified 06/01/2013, 05/27/2014, 05/26/2018   PFIZER Comirnaty(Gray Top)Covid-19 Tri-Sucrose Vaccine 12/01/2020   PFIZER(Purple Top)SARS-COV-2 Vaccination 09/05/2019, 09/26/2019, 05/30/2020   Pfizer Covid-19 Vaccine Bivalent Booster 40yrs & up 05/25/2021   Pneumococcal Conjugate-13 01/12/2015   Pneumococcal Polysaccharide-23 10/24/2010   Td 08/26/2004   Zoster Recombinant(Shingrix) 09/20/2021, 12/04/2021   Zoster, Live 05/07/2013    Health Maintenance Due  Topic Date Due   DEXA SCAN  Never done   Doing well overall   Mammogram 02/2024  Self breast exam- no lumps   Gyn health-no problems    Colon cancer screening  Colonoscopy 2018- declined 5 y recall   Bone health  Dexa  Osteopenia Declines another dexa  Falls- none  Fractures-none  Supplements -vitamin D   Last vitamin D  Lab Results  Component Value Date   VD25OH 31.94 02/07/2014    Exercise  More than she did a year ago  Walking more  More active also   Mood    04/27/2024   10:06 AM 03/02/2024    9:02 AM 10/29/2023   12:06 PM 04/08/2023    9:59 AM 01/01/2023    8:37 AM  Depression screen PHQ 2/9  Decreased Interest 0 0 0 0 0  Down, Depressed,  Hopeless 0 0 0 0 0  PHQ - 2 Score 0 0 0 0 0  Altered sleeping 0 0 0 0   Tired, decreased energy 0 0 0 0   Change in appetite 0 0 0 0   Feeling bad or failure about yourself  0 0 0 0   Trouble concentrating 0 0 0 0   Moving slowly or fidgety/restless 0 0 0 0   Suicidal thoughts 0 0 0 0   PHQ-9 Score 0 0 0 0   Difficult doing work/chores Not difficult at all Not difficult at all Not difficult at all Not difficult at all    HTN bp is stable today  No cp or palpitations or headaches or edema  No side effects to medicines  BP Readings from Last 3 Encounters:  04/27/24 133/80  03/02/24 139/70  10/29/23 139/70    Losartan  100 mg daily  Atenolol  25 mg daily   Blood pressure at home are very good high 120s/60s This am 130/69      Lab Results  Component Value Date   NA 136 10/22/2023   K 4.2 10/22/2023   CO2 25 10/22/2023   GLUCOSE 114 (H) 10/22/2023   BUN 21 10/22/2023   CREATININE 0.83 10/22/2023   CALCIUM  9.6 10/22/2023   GFR 62.77 11/22/2019   EGFR 72 10/22/2023   GFRNONAA 65 08/30/2020    Hyperlipidemia Lab Results  Component Value Date   CHOL 200 04/08/2023   HDL 60.90 04/08/2023   LDLCALC 101 (H) 04/08/2023   LDLDIRECT 94.0 04/05/2022   TRIG 192.0 (H) 04/08/2023   CHOLHDL 3 04/08/2023   Due for labs Atorvastatin  20 mg daily   Prediabetes Lab Results  Component Value Date   HGBA1C 6.1 04/08/2023   HGBA1C 6.3 04/05/2022   HGBA1C 6.4 04/04/2021   Due for labs   Continues rheumatologic care for gout and OA Takes allopuinol 200 mg  Lab Results  Component Value Date   LABURIC 2.6 10/22/2023   Lab Results  Component Value Date   WBC 8.9 10/29/2023   HGB 13.7 10/29/2023   HCT 41.9 10/29/2023   MCV 99.0 10/29/2023   PLT 322.0 10/29/2023      Patient Active Problem List   Diagnosis Date Noted   Leukocytosis 10/29/2023   Epistaxis 10/29/2023   Proteinuria 10/29/2023   Mitral valve insufficiency 12/26/2021   Postmenopausal status 12/26/2021    Gout 02/02/2019   Colon cancer screening 03/11/2017   Routine general medical examination at a health care facility 02/28/2015   Encounter for Medicare annual wellness exam 02/03/2013   Osteopenia 02/03/2012   Prediabetes 10/10/2010   COLONIC POLYPS 03/05/2010   Hyperlipidemia 07/27/2009   Essential hypertension 07/27/2009   BASAL CELL CARCINOMA, HX OF 07/27/2009   Past Medical History:  Diagnosis Date   Benign neoplasm of colon    Gout    MVP (mitral valve prolapse)    very mild   Other abnormal glucose    Other and unspecified hyperlipidemia    Personal history of other malignant neoplasm of skin    Unspecified essential hypertension    Past Surgical History:  Procedure Laterality Date   CARDIAC CATHETERIZATION  1995   normal (after abnormal stress test)   COLONOSCOPY  07/01/03   polyps   MOHS SURGERY  11/09   for basal cell lesion   Social History   Tobacco Use   Smoking status: Never    Passive exposure: Past   Smokeless tobacco: Never  Vaping Use   Vaping status: Never Used  Substance Use Topics   Alcohol use: Yes    Alcohol/week: 0.0 standard drinks of alcohol    Comment: occasional   Drug use: No   Family History  Problem Relation Age of Onset   Macular degeneration Mother    Sudden death Father 63   Heart disease Brother    Gout Brother    Arthritis Brother    Physiological scientist cancer Neg Hx    Esophageal cancer Neg Hx    Rectal cancer Neg Hx    Stomach cancer Neg Hx    Allergies  Allergen Reactions   Ampicillin     REACTION: rash   Calcium      REACTION: GI intolerance   Cephalexin     REACTION: rash   Colchicine      Diarrhea   Current Outpatient Medications on File Prior to Visit  Medication Sig Dispense Refill   allopurinol  (ZYLOPRIM ) 100 MG tablet TAKE 2 TABLETS(200 MG) BY MOUTH DAILY 180 tablet 0   atenolol  (TENORMIN ) 25 MG tablet TAKE 1 TABLET(25 MG) BY MOUTH DAILY 90 tablet 0   atorvastatin  (LIPITOR) 20 MG tablet TAKE 1  TABLET(20 MG) BY MOUTH DAILY 90 tablet 3   Cholecalciferol (VITAMIN D -3) 1000 units CAPS Take 1 capsule by mouth daily.     colchicine  0.6 MG tablet Take 1 tablet (0.6 mg  total) by mouth daily as needed. 90 tablet 0   Loperamide HCl (IMODIUM A-D PO) Take by mouth as needed.     losartan  (COZAAR ) 100 MG tablet TAKE 1 TABLET(100 MG) BY MOUTH DAILY 90 tablet 3   triamcinolone  cream (KENALOG ) 0.1 % Apply 1 application topically 2 (two) times daily as needed.  2   No current facility-administered medications on file prior to visit.    Review of Systems  Constitutional:  Negative for activity change, appetite change, fatigue, fever and unexpected weight change.  HENT:  Negative for congestion, ear pain, rhinorrhea, sinus pressure and sore throat.   Eyes:  Negative for pain, redness and visual disturbance.  Respiratory:  Negative for cough, shortness of breath and wheezing.   Cardiovascular:  Negative for chest pain and palpitations.  Gastrointestinal:  Negative for abdominal pain, blood in stool, constipation and diarrhea.  Endocrine: Negative for polydipsia and polyuria.  Genitourinary:  Negative for dysuria, frequency and urgency.  Musculoskeletal:  Negative for arthralgias, back pain and myalgias.  Skin:  Negative for pallor and rash.  Allergic/Immunologic: Negative for environmental allergies.  Neurological:  Negative for dizziness, syncope and headaches.  Hematological:  Negative for adenopathy. Does not bruise/bleed easily.  Psychiatric/Behavioral:  Negative for decreased concentration and dysphoric mood. The patient is not nervous/anxious.        Objective:   Physical Exam Constitutional:      General: She is not in acute distress.    Appearance: Normal appearance. She is well-developed and normal weight. She is not ill-appearing or diaphoretic.  HENT:     Head: Normocephalic and atraumatic.     Right Ear: Tympanic membrane, ear canal and external ear normal.     Left Ear:  Tympanic membrane, ear canal and external ear normal.     Nose: Nose normal. No congestion.     Mouth/Throat:     Mouth: Mucous membranes are moist.     Pharynx: Oropharynx is clear. No posterior oropharyngeal erythema.  Eyes:     General: No scleral icterus.    Extraocular Movements: Extraocular movements intact.     Conjunctiva/sclera: Conjunctivae normal.     Pupils: Pupils are equal, round, and reactive to light.  Neck:     Thyroid : No thyromegaly.     Vascular: No carotid bruit or JVD.  Cardiovascular:     Rate and Rhythm: Normal rate and regular rhythm.     Pulses: Normal pulses.     Heart sounds: Normal heart sounds.     No gallop.  Pulmonary:     Effort: Pulmonary effort is normal. No respiratory distress.     Breath sounds: Normal breath sounds. No wheezing.     Comments: Good air exch Chest:     Chest wall: No tenderness.  Abdominal:     General: Bowel sounds are normal. There is no distension or abdominal bruit.     Palpations: Abdomen is soft. There is no mass.     Tenderness: There is no abdominal tenderness.     Hernia: No hernia is present.  Genitourinary:    Comments: Breast exam: No mass, nodules, thickening, tenderness, bulging, retraction, inflamation, nipple discharge or skin changes noted.  No axillary or clavicular LA.     Musculoskeletal:        General: No tenderness. Normal range of motion.     Cervical back: Normal range of motion and neck supple. No rigidity. No muscular tenderness.     Right lower leg: No edema.  Left lower leg: No edema.     Comments: No kyphosis   Lymphadenopathy:     Cervical: No cervical adenopathy.  Skin:    General: Skin is warm and dry.     Coloration: Skin is not pale.     Findings: No erythema or rash.     Comments: Fair  Solar lentigines diffusely   Neurological:     Mental Status: She is alert. Mental status is at baseline.     Cranial Nerves: No cranial nerve deficit.     Motor: No abnormal muscle tone.      Coordination: Coordination normal.     Gait: Gait normal.     Deep Tendon Reflexes: Reflexes are normal and symmetric. Reflexes normal.  Psychiatric:        Mood and Affect: Mood normal.        Cognition and Memory: Cognition and memory normal.           Assessment & Plan:   Problem List Items Addressed This Visit       Cardiovascular and Mediastinum   Essential hypertension - Primary   bp in fair control at this time (better on 2nd check) At home runs high 120s/60s usually BP Readings from Last 1 Encounters:  04/27/24 133/80   No changes needed Most recent labs reviewed  Disc lifstyle change with low sodium diet and exercise   Continues Losartan  100 mg daily Atenolol  25 mg daily   Lab today      Relevant Orders   CBC with Differential/Platelet   Comprehensive metabolic panel with GFR   Lipid Panel   TSH     Digestive   COLONIC POLYPS   Declines further colonoscopies No stool changes         Musculoskeletal and Integument   Osteopenia   Declines another dexa (will let us  know if she changes her mind) No falls or fractures Takes D3   Encouraged strongly to start more exercise/ strength training         Other   Prediabetes   A1c ordered   disc imp of low glycemic diet and wt loss to prevent DM2       Relevant Orders   Hemoglobin A1c   Hyperlipidemia   Disc goals for lipids and reasons to control them Rev last labs with pt Rev low sat fat diet in detail Lab today  Atorvastatin  20 mg daily      Relevant Orders   Comprehensive metabolic panel with GFR   Lipid Panel   Gout   Continues rheum care Doing well  Lab Results  Component Value Date   LABURIC 2.6 10/22/2023    Continues allopurinol  200 mg daily  Prn colchicine   No recent flares

## 2024-04-27 NOTE — Assessment & Plan Note (Signed)
 bp in fair control at this time (better on 2nd check) At home runs high 120s/60s usually BP Readings from Last 1 Encounters:  04/27/24 133/80   No changes needed Most recent labs reviewed  Disc lifstyle change with low sodium diet and exercise   Continues Losartan  100 mg daily Atenolol  25 mg daily   Lab today

## 2024-04-27 NOTE — Assessment & Plan Note (Signed)
 Continues rheum care Doing well  Lab Results  Component Value Date   LABURIC 2.6 10/22/2023    Continues allopurinol  200 mg daily  Prn colchicine   No recent flares

## 2024-04-27 NOTE — Assessment & Plan Note (Signed)
 A1c ordered   disc imp of low glycemic diet and wt loss to prevent DM2

## 2024-04-27 NOTE — Assessment & Plan Note (Signed)
 Disc goals for lipids and reasons to control them Rev last labs with pt Rev low sat fat diet in detail  Lab today  Atorvastatin 20 mg daily

## 2024-04-27 NOTE — Patient Instructions (Addendum)
 Keep walking  Add some strength training to your routine, this is important for bone and brain health and can reduce your risk of falls and help your body use insulin properly and regulate weight  Light weights, exercise bands , and internet videos are a good way to start  Yoga (chair or regular), machines , floor exercises or a gym with machines are also good options   Labs today   To prevent diabetes  Try to get most of your carbohydrates from produce (with the exception of white potatoes) and whole grains Eat less bread/pasta/rice/snack foods/cereals/sweets and other items from the middle of the grocery store (processed carbs)   For cholesterol Avoid red meat/ fried foods/ egg yolks/ fatty breakfast meats/ butter, cheese and high fat dairy/ and shellfish

## 2024-05-03 NOTE — Progress Notes (Signed)
 Office Visit Note  Patient: Cindy [name]             Date of Birth: October 13, 1945           MRN: [id number]             PCP: Randeen Laine LABOR, MD Referring: Tower, Laine LABOR, MD Visit Date: 05/17/2024 Occupation: @GUAROCC @  Subjective:  Medication monitoring   History of Present Illness: Cindy [name] is a 78 y.o. female with history of gout and osteoarthritis. She remains on Allopurinol  100 mg 2 tablets by mouth daily and colchicine  0.6mg  1 tablet by mouth daily as needed.  Patient reports that she is tolerating allopurinol  without any side effects and has not had any gaps in therapy.  Patient states that she has not needed to take colchicine  since she has not had any signs or symptoms of a gout flare.  She experiences some stiffness first thing in the mornings but denies any joint inflammation at this time.  She denies any tophi.  She denies any new medical conditions.   Activities of Daily Living:  Patient reports morning stiffness for 15-20 minutes  Patient Denies nocturnal pain.  Difficulty dressing/grooming: Denies Difficulty climbing stairs: Reports Difficulty getting out of chair: Reports Difficulty using hands for taps, buttons, cutlery, and/or writing: Denies  Review of Systems  Constitutional:  Negative for fatigue.  HENT:  Negative for mouth sores, mouth dryness and nose dryness.   Eyes:  Negative for pain, visual disturbance and dryness.  Respiratory:  Negative for cough, hemoptysis, shortness of breath and difficulty breathing.   Cardiovascular:  Negative for chest pain, palpitations, hypertension and swelling in legs/feet.  Gastrointestinal:  Negative for blood in stool, constipation and diarrhea.  Endocrine: Negative for increased urination.  Genitourinary:  Negative for painful urination.  Musculoskeletal:  Positive for morning stiffness. Negative for joint pain, joint pain, joint swelling, myalgias, muscle weakness, muscle tenderness and myalgias.  Skin:  Negative  for color change, pallor, rash, hair loss, nodules/bumps, skin tightness, ulcers and sensitivity to sunlight.  Allergic/Immunologic: Negative for susceptible to infections.  Neurological:  Negative for dizziness, numbness, headaches and weakness.  Hematological:  Negative for swollen glands.  Psychiatric/Behavioral:  Negative for depressed mood and sleep disturbance. The patient is not nervous/anxious.     PMFS History:  Patient Active Problem List   Diagnosis Date Noted   Leukocytosis 10/29/2023   Epistaxis 10/29/2023   Proteinuria 10/29/2023   Mitral valve insufficiency 12/26/2021   Postmenopausal status 12/26/2021   Gout 02/02/2019   Colon cancer screening 03/11/2017   Routine general medical examination at a health care facility 02/28/2015   Encounter for Medicare annual wellness exam 02/03/2013   Osteopenia 02/03/2012   Prediabetes 10/10/2010   COLONIC POLYPS 03/05/2010   Hyperlipidemia 07/27/2009   Essential hypertension 07/27/2009   BASAL CELL CARCINOMA, HX OF 07/27/2009    Past Medical History:  Diagnosis Date   Benign neoplasm of colon    Gout    MVP (mitral valve prolapse)    very mild   Other abnormal glucose    Other and unspecified hyperlipidemia    Personal history of other malignant neoplasm of skin    Unspecified essential hypertension     Family History  Problem Relation Age of Onset   Macular degeneration Mother    Sudden death Father 66   Heart disease Brother    Gout Brother    Arthritis Brother    Healthy Daughter    Colon cancer Neg  Hx    Esophageal cancer Neg Hx    Rectal cancer Neg Hx    Stomach cancer Neg Hx    Past Surgical History:  Procedure Laterality Date   CARDIAC CATHETERIZATION  1995   normal (after abnormal stress test)   COLONOSCOPY  07/01/03   polyps   MOHS SURGERY  11/09   for basal cell lesion   Social History   Social History Narrative   Retired-med [employer]   Husband had advanced parkinson's and dementia, passed away in  06-16-13.   No regular exercise   Immunization History  Administered Date(s) Administered   Fluad Quad(high Dose 65+) 05/25/2019   Hepatitis B 07/03/1988, 07/30/1988, 01/01/1989   INFLUENZA, HIGH DOSE SEASONAL PF 06/17/2018   Influenza Split 06/04/2011   Influenza Whole 04/26/2009, 05/26/2009, 05/28/2010, 06/02/2012   Influenza,inj,Quad PF,6+ Mos 06/18/2017   Influenza-Unspecified 06/01/2013, 05/27/2014, 05/26/2018   PFIZER Comirnaty(Gray Top)Covid-19 Tri-Sucrose Vaccine 12/01/2020   PFIZER(Purple Top)SARS-COV-2 Vaccination 09/05/2019, 09/26/2019, 05/30/2020   Pfizer Covid-19 Vaccine Bivalent Booster 84yrs & up 2021/06/16   Pneumococcal Conjugate-13 01/12/2015   Pneumococcal Polysaccharide-23 10/24/2010   Td 08/26/2004   Zoster Recombinant(Shingrix) 09/20/2021, 12/04/2021   Zoster, Live 05/07/2013     Objective: Vital Signs: Wt 135 lb (61.2 kg)   BMI 27.73 kg/m    Physical Exam Vitals and nursing note reviewed.  Constitutional:      Appearance: She is well-developed.  HENT:     Head: Normocephalic and atraumatic.  Eyes:     Conjunctiva/sclera: Conjunctivae normal.  Cardiovascular:     Rate and Rhythm: Normal rate and regular rhythm.     Heart sounds: Normal heart sounds.  Pulmonary:     Effort: Pulmonary effort is normal.     Breath sounds: Normal breath sounds.  Abdominal:     General: Bowel sounds are normal.     Palpations: Abdomen is soft.  Musculoskeletal:     Cervical back: Normal range of motion.  Lymphadenopathy:     Cervical: No cervical adenopathy.  Skin:    General: Skin is warm and dry.     Capillary Refill: Capillary refill takes less than 2 seconds.  Neurological:     Mental Status: She is alert and oriented to person, place, and time.  Psychiatric:        Behavior: Behavior normal.      Musculoskeletal Exam: C-spine has good ROM.  Thoracic kyphosis noted.  Shoulder joints, elbow joints, wrist joints, Mcps, PIPs, and DIPs good ROM with no synovitis.   Complete fist formation bilaterally.  PIP and DIP thickening noted.  Hip joints have slightly limited ROM but no groin pain currently.  Knee joints have good ROM with no warmth or effusion.  Ankle joints have good ROM with no tenderness or joint swelling.    CDAI Exam: CDAI Score: -- Patient Global: --; Provider Global: -- Swollen: --; Tender: -- Joint Exam 05/17/2024   No joint exam has been documented for this visit   There is currently no information documented on the homunculus. Go to the Rheumatology activity and complete the homunculus joint exam.  Investigation: No additional findings.  Imaging: No results found.  Recent Labs: Lab Results  Component Value Date   WBC 5.8 04/27/2024   HGB 13.9 04/27/2024   PLT 256.0 04/27/2024   NA 138 04/27/2024   K 3.9 04/27/2024   CL 100 04/27/2024   CO2 27 04/27/2024   GLUCOSE 108 (H) 04/27/2024   BUN 16 04/27/2024   CREATININE 0.74 04/27/2024  BILITOT 1.0 04/27/2024   ALKPHOS 74 04/27/2024   AST 25 04/27/2024   ALT 22 04/27/2024   PROT 7.3 04/27/2024   ALBUMIN 4.3 04/27/2024   CALCIUM  9.6 04/27/2024   GFRAA 76 08/30/2020    Speciality Comments: No specialty comments available.  Procedures:  No procedures performed Allergies: Ampicillin, Calcium , Cephalexin, and Colchicine     Assessment / Plan:     Visit Diagnoses: Idiopathic chronic gout of multiple sites without tophus - She has not had any signs or symptoms of a gout flare.  She has clinically been doing well taking allopurinol  100 mg 2 tablets by mouth daily.  She is tolerating allopurinol  without any side effects and has not had any gaps in therapy.  Her uric acid level was within the desirable range: 2.6 on 10/22/2023.  She has not needed to take colchicine  since she has not had any symptoms of a flare.   Plan to recheck uric acid level today.  Discussed the importance of remaining on allopurinol  as prescribed.  She is advised to notify us  if she develops any signs or  symptoms of a flare.  She will follow-up in the office in 6 months or sooner if needed.  Plan: Uric acid  Hyperuricemia - Plan to check uric acid level today.  Plan: Uric acid  Medication monitoring encounter - Allopurinol  100 mg 2 tablets by mouth daily and colchicine  0.6mg  1 tablet by mouth daily as needed. CBC and CMP updated on 04/27/24.   Uric acid updated 10/22/23-2.6.  Plan to check uric acid level today.   - Plan: Uric acid  Primary osteoarthritis of both hands: PIP and DIP thickening consistent with osteoarthritis of both hands.  No synovitis noted. Complete fist formation bilaterally.   Trochanteric bursitis of both hips: Not currently symptomatic.  Primary osteoarthritis of both feet: She has good range of motion of both ankle joints with no tenderness or joint swelling.  No evidence of Achilles tendinitis or plantar fasciitis.  No tenderness or synovitis over MTP joints. She is wearing proper fitting shoes.   Chronic midline low back pain without sciatica: Not currently symptomatic.    Postural kyphosis of thoracic region: Thoracic kyphosis noted.     Other medical conditions are listed as follows:   Prediabetes: Hemoglobin A1c was 6.5% 04/27/2024.  History of hyperlipidemia: Triglycerides improved to 157, rest of lipid panel within normal limits on 04/27/2024.  Hx of colonic polyps  Essential hypertension: BP was elevated today in the office.  Patient was advised to monitor BP closely and to reach out to PCP if it remains elevated.   Patient has been checking BP at home.   History of basal cell carcinoma  History of alcohol use  Orders: Orders Placed This Encounter  Procedures   Uric acid   No orders of the defined types were placed in this encounter.    Follow-Up Instructions: Return in about 6 months (around 11/14/2024) for Gout.   Waddell CHRISTELLA Craze, PA-C  Note - This record has been created using Dragon software.  Chart creation errors have been sought, but may  not always  have been located. Such creation errors do not reflect on  the standard of medical care.

## 2024-05-17 ENCOUNTER — Encounter: Payer: Self-pay | Admitting: Physician Assistant

## 2024-05-17 ENCOUNTER — Ambulatory Visit: Payer: Medicare Other | Attending: Physician Assistant | Admitting: Physician Assistant

## 2024-05-17 VITALS — BP 180/85 | HR 61 | Temp 97.8°F | Resp 15 | Ht 59.0 in | Wt 135.0 lb

## 2024-05-17 DIAGNOSIS — M19042 Primary osteoarthritis, left hand: Secondary | ICD-10-CM | POA: Diagnosis not present

## 2024-05-17 DIAGNOSIS — M545 Low back pain, unspecified: Secondary | ICD-10-CM | POA: Diagnosis not present

## 2024-05-17 DIAGNOSIS — M19071 Primary osteoarthritis, right ankle and foot: Secondary | ICD-10-CM | POA: Insufficient documentation

## 2024-05-17 DIAGNOSIS — M19072 Primary osteoarthritis, left ankle and foot: Secondary | ICD-10-CM | POA: Insufficient documentation

## 2024-05-17 DIAGNOSIS — M7061 Trochanteric bursitis, right hip: Secondary | ICD-10-CM | POA: Diagnosis not present

## 2024-05-17 DIAGNOSIS — Z8639 Personal history of other endocrine, nutritional and metabolic disease: Secondary | ICD-10-CM | POA: Insufficient documentation

## 2024-05-17 DIAGNOSIS — R7303 Prediabetes: Secondary | ICD-10-CM | POA: Diagnosis not present

## 2024-05-17 DIAGNOSIS — I10 Essential (primary) hypertension: Secondary | ICD-10-CM | POA: Insufficient documentation

## 2024-05-17 DIAGNOSIS — E790 Hyperuricemia without signs of inflammatory arthritis and tophaceous disease: Secondary | ICD-10-CM | POA: Insufficient documentation

## 2024-05-17 DIAGNOSIS — M7062 Trochanteric bursitis, left hip: Secondary | ICD-10-CM | POA: Insufficient documentation

## 2024-05-17 DIAGNOSIS — Z85828 Personal history of other malignant neoplasm of skin: Secondary | ICD-10-CM | POA: Diagnosis not present

## 2024-05-17 DIAGNOSIS — M1A09X0 Idiopathic chronic gout, multiple sites, without tophus (tophi): Secondary | ICD-10-CM | POA: Diagnosis not present

## 2024-05-17 DIAGNOSIS — M19041 Primary osteoarthritis, right hand: Secondary | ICD-10-CM | POA: Insufficient documentation

## 2024-05-17 DIAGNOSIS — Z87898 Personal history of other specified conditions: Secondary | ICD-10-CM | POA: Diagnosis not present

## 2024-05-17 DIAGNOSIS — Z5181 Encounter for therapeutic drug level monitoring: Secondary | ICD-10-CM | POA: Diagnosis not present

## 2024-05-17 DIAGNOSIS — Z860100 Personal history of colon polyps, unspecified: Secondary | ICD-10-CM | POA: Diagnosis not present

## 2024-05-17 DIAGNOSIS — M4004 Postural kyphosis, thoracic region: Secondary | ICD-10-CM | POA: Diagnosis not present

## 2024-05-17 DIAGNOSIS — G8929 Other chronic pain: Secondary | ICD-10-CM | POA: Insufficient documentation

## 2024-05-18 ENCOUNTER — Ambulatory Visit: Payer: Self-pay | Admitting: Physician Assistant

## 2024-05-18 LAB — URIC ACID: Uric Acid, Serum: 2.1 mg/dL — ABNORMAL LOW (ref 2.5–7.0)

## 2024-05-18 NOTE — Progress Notes (Signed)
 Uric acid level 2.1.  Okay to continue current dose of allopurinol .

## 2024-05-28 ENCOUNTER — Other Ambulatory Visit: Payer: Self-pay | Admitting: Family Medicine

## 2024-06-02 DIAGNOSIS — H40053 Ocular hypertension, bilateral: Secondary | ICD-10-CM | POA: Diagnosis not present

## 2024-06-08 ENCOUNTER — Other Ambulatory Visit: Payer: Self-pay | Admitting: Physician Assistant

## 2024-06-08 NOTE — Telephone Encounter (Signed)
 Last Fill: 03/15/2024  Labs: 04/27/2024 Glucose 108  Next Visit: 11/23/2024  Last Visit: 05/17/2024  DX:  Idiopathic chronic gout of multiple sites without tophus   Current Dose per office note 05/17/2024:  Allopurinol  100 mg 2 tablets by mouth daily   Okay to refill Allopurinol ?

## 2024-06-16 DIAGNOSIS — Z23 Encounter for immunization: Secondary | ICD-10-CM | POA: Diagnosis not present

## 2024-06-21 ENCOUNTER — Other Ambulatory Visit: Payer: Self-pay | Admitting: Family Medicine

## 2024-06-30 DIAGNOSIS — Z23 Encounter for immunization: Secondary | ICD-10-CM | POA: Diagnosis not present

## 2024-07-25 ENCOUNTER — Encounter: Payer: Self-pay | Admitting: Family Medicine

## 2024-07-26 NOTE — Telephone Encounter (Signed)
 Lvm asking pt to call back. Pls schedule 3 mo f/u OV to recheck A1c (see 04/27/24 Results F/u note) . This will be checked at OV. No fasting needed.   Will also send MyChart message.

## 2024-07-26 NOTE — Telephone Encounter (Signed)
 Pt called back and scheduled appt

## 2024-08-03 ENCOUNTER — Encounter: Payer: Self-pay | Admitting: Family Medicine

## 2024-08-03 ENCOUNTER — Ambulatory Visit: Admitting: Family Medicine

## 2024-08-03 VITALS — BP 135/70 | HR 73 | Temp 98.0°F | Ht 59.0 in | Wt 130.2 lb

## 2024-08-03 DIAGNOSIS — I10 Essential (primary) hypertension: Secondary | ICD-10-CM | POA: Diagnosis not present

## 2024-08-03 DIAGNOSIS — R7303 Prediabetes: Secondary | ICD-10-CM

## 2024-08-03 DIAGNOSIS — F4320 Adjustment disorder, unspecified: Secondary | ICD-10-CM | POA: Diagnosis not present

## 2024-08-03 LAB — POCT GLYCOSYLATED HEMOGLOBIN (HGB A1C): Hemoglobin A1C: 5.8 % — AB (ref 4.0–5.6)

## 2024-08-03 NOTE — Assessment & Plan Note (Addendum)
 Improved with some weight loss Lab Results  Component Value Date   HGBA1C 5.8 (A) 08/03/2024   HGBA1C 6.5 04/27/2024   HGBA1C 6.1 04/08/2023   disc imp of low glycemic diet and wt loss to prevent DM2   Encouraged making an exercise schedule also

## 2024-08-03 NOTE — Patient Instructions (Addendum)
 Avoid added sugars in your diet when you can  Try to get most of your carbohydrates from produce (with the exception of white potatoes) and whole grains Eat less bread/pasta/rice/snack foods/cereals/sweets and other items from the middle of the grocery store (processed carbs)  Continue to be active Try and add exercise (for health) another 30 minutes per day if possible Strength training is most important Add some strength training to your routine, this is important for bone and brain health and can reduce your risk of falls and help your body use insulin properly and regulate weight  Light weights, exercise bands , and internet videos are a good way to start  Yoga (chair or regular), machines , floor exercises or a gym with machines are also good options    Low glycemic diet helps Weight loss helps More muscle helps   Take care of yourself  A1c is better, keep up the good work

## 2024-08-03 NOTE — Progress Notes (Signed)
 Subjective:    Patient ID: Cindy [name], female    DOB: 04-11-1946, 78 y.o.   MRN: [id number]  HPI  Wt Readings from Last 3 Encounters:  08/03/24 130 lb 4 oz (59.1 kg)  05/17/24 135 lb (61.2 kg)  04/27/24 134 lb 8 oz (61 kg)   26.31 kg/m  Vitals:   08/03/24 1131 08/03/24 1151  BP: (!) 146/78 135/70  Pulse: 73   Temp: 98 F (36.7 C)   SpO2: 97%    Pt presents for follow up of chronic medical problems  HTN Prediabetes   Tough few months 61 year old mother-hospitalized / placed and then died mid 08-13-24  Very busy, cleaning out her house   Is coming around a little bit   Grief-doing ok  Talks to her daughter and brother frequently  Hospice offered counseling      HTN bp is stable today  No cp or palpitations or headaches or edema  No side effects to medicines  BP Readings from Last 3 Encounters:  08/03/24 135/70  05/17/24 (!) 180/85  04/27/24 133/80      Blood pressure readings at home  Avg 120s-60s to 70s Occational in 130s    Continues losartan  100 mg daily    Lab Results  Component Value Date   NA 138 04/27/2024   K 3.9 04/27/2024   CO2 27 04/27/2024   GLUCOSE 108 (H) 04/27/2024   BUN 16 04/27/2024   CREATININE 0.74 04/27/2024   CALCIUM  9.6 04/27/2024   GFR 77.34 04/27/2024   EGFR 72 10/22/2023   GFRNONAA 65 08/30/2020    Prediabetes A1c is 5.8  Lab Results  Component Value Date   HGBA1C 5.8 (A) 08/03/2024   HGBA1C 6.5 04/27/2024   HGBA1C 6.1 04/08/2023   Did not have opportunity to watch diet as much as she wanted to  Did loose weight in context of grief (poor appetite)   Very active- cleaning out her mother's house          Patient Active Problem List   Diagnosis Date Noted   Grief reaction 08/03/2024   Leukocytosis 10/29/2023   Epistaxis 10/29/2023   Proteinuria 10/29/2023   Mitral valve insufficiency 12/26/2021   Postmenopausal status 12/26/2021   Gout 02/02/2019   Colon cancer screening 03/11/2017    Routine general medical examination at a health care facility 02/28/2015   Encounter for Medicare annual wellness exam 02/03/2013   Osteopenia 02/03/2012   Prediabetes 10/10/2010   COLONIC POLYPS 03/05/2010   Hyperlipidemia 07/27/2009   Essential hypertension 07/27/2009   BASAL CELL CARCINOMA, HX OF 07/27/2009   Past Medical History:  Diagnosis Date   Benign neoplasm of colon    Gout    MVP (mitral valve prolapse)    very mild   Other abnormal glucose    Other and unspecified hyperlipidemia    Personal history of other malignant neoplasm of skin    Unspecified essential hypertension    Past Surgical History:  Procedure Laterality Date   CARDIAC CATHETERIZATION  1995   normal (after abnormal stress test)   COLONOSCOPY  07/01/03   polyps   MOHS SURGERY  11/09   for basal cell lesion   Social History   Tobacco Use   Smoking status: Never    Passive exposure: Past   Smokeless tobacco: Never  Vaping Use   Vaping status: Never Used  Substance Use Topics   Alcohol use: Yes    Alcohol/week: 0.0 standard drinks of alcohol  Comment: occasional   Drug use: No   Family History  Problem Relation Age of Onset   Macular degeneration Mother    Sudden death Father 2   Heart disease Brother    Gout Brother    Arthritis Brother    Healthy Daughter    Colon cancer Neg Hx    Esophageal cancer Neg Hx    Rectal cancer Neg Hx    Stomach cancer Neg Hx    Allergies  Allergen Reactions   Ampicillin     REACTION: rash   Calcium      REACTION: GI intolerance   Cephalexin     REACTION: rash   Colchicine      Diarrhea   Current Outpatient Medications on File Prior to Visit  Medication Sig Dispense Refill   allopurinol  (ZYLOPRIM ) 100 MG tablet TAKE 2 TABLETS(200 MG) BY MOUTH DAILY 180 tablet 0   atenolol  (TENORMIN ) 25 MG tablet TAKE 1 TABLET(25 MG) BY MOUTH DAILY 90 tablet 2   atorvastatin  (LIPITOR) 20 MG tablet TAKE 1 TABLET(20 MG) BY MOUTH DAILY 90 tablet 3   Cholecalciferol  (VITAMIN D -3) 1000 units CAPS Take 1 capsule by mouth daily.     colchicine  0.6 MG tablet Take 1 tablet (0.6 mg total) by mouth daily as needed. 90 tablet 0   Loperamide HCl (IMODIUM A-D PO) Take by mouth as needed.     losartan  (COZAAR ) 100 MG tablet TAKE 1 TABLET(100 MG) BY MOUTH DAILY 90 tablet 3   triamcinolone  cream (KENALOG ) 0.1 % Apply 1 application topically 2 (two) times daily as needed.  2   No current facility-administered medications on file prior to visit.    Review of Systems  Constitutional:  Negative for activity change, appetite change, fatigue, fever and unexpected weight change.  HENT:  Negative for congestion, ear pain, rhinorrhea, sinus pressure and sore throat.   Eyes:  Negative for pain, redness and visual disturbance.  Respiratory:  Negative for cough, shortness of breath and wheezing.   Cardiovascular:  Negative for chest pain and palpitations.  Gastrointestinal:  Negative for abdominal pain, blood in stool, constipation and diarrhea.  Endocrine: Negative for polydipsia and polyuria.  Genitourinary:  Negative for dysuria, frequency and urgency.  Musculoskeletal:  Negative for arthralgias, back pain and myalgias.  Skin:  Negative for pallor and rash.  Allergic/Immunologic: Negative for environmental allergies.  Neurological:  Negative for dizziness, syncope and headaches.  Hematological:  Negative for adenopathy. Does not bruise/bleed easily.  Psychiatric/Behavioral:  Negative for decreased concentration and dysphoric mood. The patient is nervous/anxious.        Objective:   Physical Exam Constitutional:      General: She is not in acute distress.    Appearance: Normal appearance. She is well-developed and normal weight. She is not ill-appearing or diaphoretic.  HENT:     Head: Normocephalic and atraumatic.  Eyes:     Conjunctiva/sclera: Conjunctivae normal.     Pupils: Pupils are equal, round, and reactive to light.  Neck:     Thyroid : No thyromegaly.      Vascular: No carotid bruit or JVD.  Cardiovascular:     Rate and Rhythm: Normal rate and regular rhythm.     Heart sounds: Normal heart sounds.     No gallop.  Pulmonary:     Effort: Pulmonary effort is normal. No respiratory distress.     Breath sounds: Normal breath sounds. No wheezing or rales.  Abdominal:     General: There is no distension or  abdominal bruit.     Palpations: Abdomen is soft.  Musculoskeletal:     Cervical back: Normal range of motion and neck supple.     Right lower leg: No edema.     Left lower leg: No edema.  Lymphadenopathy:     Cervical: No cervical adenopathy.  Skin:    General: Skin is warm and dry.     Coloration: Skin is not pale.     Findings: No rash.  Neurological:     Mental Status: She is alert.     Coordination: Coordination normal.     Deep Tendon Reflexes: Reflexes are normal and symmetric. Reflexes normal.  Psychiatric:        Mood and Affect: Mood normal.           Assessment & Plan:   Problem List Items Addressed This Visit       Cardiovascular and Mediastinum   Essential hypertension   bp in fair control at this time (better on 2nd check) At home runs high 120s/60s usually BP Readings from Last 1 Encounters:  08/03/24 135/70   No changes needed Most recent labs reviewed  Disc lifstyle change with low sodium diet and exercise   Continues Losartan  100 mg daily Atenolol  25 mg daily   Some white coat effect Is better at home -usually 120s systolic         Other   Prediabetes - Primary   Improved with some weight loss Lab Results  Component Value Date   HGBA1C 5.8 (A) 08/03/2024   HGBA1C 6.5 04/27/2024   HGBA1C 6.1 04/08/2023   disc imp of low glycemic diet and wt loss to prevent DM2   Encouraged making an exercise schedule also       Relevant Orders   POCT HgB A1C (Completed)   Grief reaction   Lost her 15 year old mother  Some anxiety early on  Doing better now  Good support   Will call if  interested in grief counseling Hospice has reached out ot her as well

## 2024-08-03 NOTE — Assessment & Plan Note (Signed)
 Lost her 79 year old mother  Some anxiety early on  Doing better now  Good support   Will call if interested in grief counseling Hospice has reached out ot her as well

## 2024-08-03 NOTE — Assessment & Plan Note (Signed)
 bp in fair control at this time (better on 2nd check) At home runs high 120s/60s usually BP Readings from Last 1 Encounters:  08/03/24 135/70   No changes needed Most recent labs reviewed  Disc lifstyle change with low sodium diet and exercise   Continues Losartan  100 mg daily Atenolol  25 mg daily   Some white coat effect Is better at home -usually 120s systolic

## 2024-09-07 ENCOUNTER — Other Ambulatory Visit: Payer: Self-pay | Admitting: Physician Assistant

## 2024-09-08 NOTE — Telephone Encounter (Signed)
 Last Fill: 06/08/2024  Labs: 04/27/2024 RFE:Holrndz,Aoi:[id number], CBC:WNL, Uric acid:2.1  Next Visit: 11/23/2024  Last Visit: 05/17/2024  DX: Idiopathic chronic gout of multiple sites without tophus   Current Dose per office note 05/17/2024: Allopurinol  100 mg 2 tablets by mouth daily   Okay to refill Allopurinol ?

## 2024-11-23 ENCOUNTER — Ambulatory Visit: Admitting: Rheumatology

## 2025-03-03 ENCOUNTER — Ambulatory Visit

## 2025-03-04 ENCOUNTER — Ambulatory Visit

## 2025-05-04 ENCOUNTER — Encounter: Admitting: Family Medicine
